# Patient Record
Sex: Male | Born: 1945 | Race: White | Hispanic: No | Marital: Single | Smoking: Former smoker
Health system: Southern US, Community
[De-identification: ages and names within clinical notes are randomized; demographics above are authoritative.]

## PROBLEM LIST (undated history)

## (undated) DIAGNOSIS — C9110 Chronic lymphocytic leukemia of B-cell type not having achieved remission: Principal | ICD-10-CM

## (undated) DIAGNOSIS — F0390 Unspecified dementia without behavioral disturbance: Secondary | ICD-10-CM

## (undated) DIAGNOSIS — R5383 Other fatigue: Secondary | ICD-10-CM

## (undated) DIAGNOSIS — R270 Ataxia, unspecified: Secondary | ICD-10-CM

## (undated) DIAGNOSIS — I10 Essential (primary) hypertension: Secondary | ICD-10-CM

## (undated) DIAGNOSIS — C801 Malignant (primary) neoplasm, unspecified: Secondary | ICD-10-CM

## (undated) DIAGNOSIS — E785 Hyperlipidemia, unspecified: Secondary | ICD-10-CM

## (undated) DIAGNOSIS — G4700 Insomnia, unspecified: Secondary | ICD-10-CM

## (undated) DIAGNOSIS — D72820 Lymphocytosis (symptomatic): Principal | ICD-10-CM

## (undated) DIAGNOSIS — N189 Chronic kidney disease, unspecified: Secondary | ICD-10-CM

## (undated) DIAGNOSIS — M109 Gout, unspecified: Secondary | ICD-10-CM

## (undated) HISTORY — DX: Gout, unspecified: M10.9

## (undated) HISTORY — DX: Ataxia, unspecified: R27.0

## (undated) HISTORY — DX: Essential (primary) hypertension: I10

## (undated) HISTORY — DX: Malignant (primary) neoplasm, unspecified: C80.1

## (undated) HISTORY — DX: Insomnia, unspecified: G47.00

## (undated) HISTORY — PX: LYMPH NODE DISSECTION: SHX5087

## (undated) HISTORY — DX: Unspecified dementia, unspecified severity, without behavioral disturbance, psychotic disturbance, mood disturbance, and anxiety: F03.90

## (undated) HISTORY — DX: Chronic kidney disease, unspecified: N18.9

## (undated) HISTORY — DX: Other fatigue: R53.83

## (undated) HISTORY — DX: Hyperlipidemia, unspecified: E78.5

## (undated) HISTORY — DX: Chronic lymphocytic leukemia of B-cell type not having achieved remission: C91.10

## (undated) HISTORY — DX: Lymphocytosis (symptomatic): D72.820

---

## 2012-06-28 ENCOUNTER — Ambulatory Visit: Payer: Self-pay | Admitting: Family Medicine

## 2012-06-28 VITALS — BP 124/88 | HR 79 | Temp 98.7°F | Resp 18 | Ht 78.0 in | Wt 327.0 lb

## 2012-06-28 DIAGNOSIS — I10 Essential (primary) hypertension: Secondary | ICD-10-CM

## 2012-06-28 DIAGNOSIS — E785 Hyperlipidemia, unspecified: Secondary | ICD-10-CM

## 2012-06-28 DIAGNOSIS — L821 Other seborrheic keratosis: Secondary | ICD-10-CM

## 2012-06-28 LAB — COMPREHENSIVE METABOLIC PANEL
ALT: 20 U/L (ref 0–53)
AST: 18 U/L (ref 0–37)
Albumin: 4.5 g/dL (ref 3.5–5.2)
Alkaline Phosphatase: 67 U/L (ref 39–117)
BUN: 30 mg/dL — ABNORMAL HIGH (ref 6–23)
CO2: 27 mEq/L (ref 19–32)
Calcium: 9.5 mg/dL (ref 8.4–10.5)
Chloride: 103 mEq/L (ref 96–112)
Creat: 1.09 mg/dL (ref 0.50–1.35)
Glucose, Bld: 111 mg/dL — ABNORMAL HIGH (ref 70–99)
Potassium: 4.8 mEq/L (ref 3.5–5.3)
Sodium: 138 mEq/L (ref 135–145)
Total Bilirubin: 0.8 mg/dL (ref 0.3–1.2)
Total Protein: 7.2 g/dL (ref 6.0–8.3)

## 2012-06-28 LAB — LIPID PANEL
Cholesterol: 164 mg/dL (ref 0–200)
HDL: 42 mg/dL (ref >39)
LDL Cholesterol: 105 mg/dL — ABNORMAL HIGH (ref 0–99)
Total CHOL/HDL Ratio: 3.9 Ratio
Triglycerides: 86 mg/dL (ref <150)
VLDL: 17 mg/dL (ref 0–40)

## 2012-06-28 MED ORDER — SIMVASTATIN 10 MG PO TABS: 10.0000 mg | ORAL_TABLET | Freq: Every day | ORAL | Status: DC

## 2012-06-28 MED ORDER — LISINOPRIL-HYDROCHLOROTHIAZIDE 10-12.5 MG PO TABS: 2.0000 | ORAL_TABLET | Freq: Every day | ORAL | Status: DC

## 2012-06-28 NOTE — Progress Notes (Signed)
66 yo on blood pressure and cholesterol medication for several years.  Needs refills.  No problems with them at present.  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  No tobacco in 30 years.  Also c/o right itchy skin lesion on back  Objective:  NAD 3 mm seborrheic keratosis. Chest:  Clear Heart:  Regular, no gallop or murmur Ext:  1+ pedal edema  Assessment:  Seborrheic keratosis, hypertension controlled, hyperlipidemia

## 2012-07-02 ENCOUNTER — Telehealth: Payer: Self-pay

## 2012-07-02 NOTE — Telephone Encounter (Signed)
Pt is needing to talk with someone about his medication lisiniopril and changing the dosage

## 2012-07-03 NOTE — Telephone Encounter (Signed)
Patient called because rx given was Lisinopril/HCTZ 10-12.5 mg 2 tabs poqd. His previous rx was Lisinopril 20-12.5 mg poqd. Wanted to know why it was different. Please clarify. If needs to be on previous dosage wants sent to pharmacy.

## 2012-07-04 NOTE — Telephone Encounter (Signed)
Pt needs to be on which ever medication dose he was on when he was last seen.  He has only filled Rx once at [facility] and it was Lisinopril/HCTZ 10/12.5 2 pills qd.  If he was taking different dose please send that to pharmacy under my name please.

## 2012-07-04 NOTE — Telephone Encounter (Signed)
Patient states he was taking higher dose. He said pharmacy advised him it was changed, I will call [facility] to verify. , called [facility], was on hold long duration, so left message for them to call me back.

## 2012-07-04 NOTE — Telephone Encounter (Signed)
[facility] does not know either. He is advised to bring in the pill bottle from [place] so I can look at it and see if it is correct, he will come in tomorrow and let me look at the bottle.

## 2012-07-05 ENCOUNTER — Other Ambulatory Visit: Payer: Self-pay | Admitting: Radiology

## 2012-07-05 MED ORDER — LISINOPRIL-HYDROCHLOROTHIAZIDE 20-12.5 MG PO TABS: 2.0000 | ORAL_TABLET | Freq: Every day | ORAL | Status: DC

## 2012-07-07 NOTE — Telephone Encounter (Signed)
Patient did bring in the old bottle, it was prescribed incorrectly, Dr L advised to give as per the old bottle this is corrected.

## 2012-12-31 ENCOUNTER — Other Ambulatory Visit: Payer: Self-pay | Admitting: Family Medicine

## 2013-02-01 ENCOUNTER — Other Ambulatory Visit: Payer: Self-pay | Admitting: Family Medicine

## 2013-03-11 ENCOUNTER — Ambulatory Visit (INDEPENDENT_AMBULATORY_CARE_PROVIDER_SITE_OTHER): Payer: Medicare Other | Admitting: Family Medicine

## 2013-03-11 VITALS — BP 136/82 | HR 84 | Temp 97.4°F | Resp 18 | Ht 77.0 in | Wt 328.6 lb

## 2013-03-11 DIAGNOSIS — M1990 Unspecified osteoarthritis, unspecified site: Secondary | ICD-10-CM

## 2013-03-11 DIAGNOSIS — I10 Essential (primary) hypertension: Secondary | ICD-10-CM

## 2013-03-11 DIAGNOSIS — E785 Hyperlipidemia, unspecified: Secondary | ICD-10-CM | POA: Insufficient documentation

## 2013-03-11 DIAGNOSIS — N289 Disorder of kidney and ureter, unspecified: Secondary | ICD-10-CM

## 2013-03-11 DIAGNOSIS — Z8739 Personal history of other diseases of the musculoskeletal system and connective tissue: Secondary | ICD-10-CM

## 2013-03-11 DIAGNOSIS — M129 Arthropathy, unspecified: Secondary | ICD-10-CM

## 2013-03-11 DIAGNOSIS — K767 Hepatorenal syndrome: Secondary | ICD-10-CM

## 2013-03-11 LAB — LIPID PANEL
LDL Cholesterol: 73 mg/dL (ref 0–99)
Triglycerides: 80 mg/dL (ref <150)

## 2013-03-11 LAB — POCT CBC
Granulocyte percent: 37.9 %G (ref 37–80)
HCT, POC: 47 % (ref 43.5–53.7)
Hemoglobin: 15.4 g/dL (ref 14.1–18.1)
POC Granulocyte: 5 (ref 2–6.9)
POC LYMPH PERCENT: 57.7 %L — AB (ref 10–50)
RDW, POC: 13.7 %

## 2013-03-11 LAB — COMPREHENSIVE METABOLIC PANEL
ALT: 18 U/L (ref 0–53)
Albumin: 4.4 g/dL (ref 3.5–5.2)
Alkaline Phosphatase: 78 U/L (ref 39–117)
Glucose, Bld: 111 mg/dL — ABNORMAL HIGH (ref 70–99)
Potassium: 4 mEq/L (ref 3.5–5.3)
Sodium: 138 mEq/L (ref 135–145)
Total Protein: 7.2 g/dL (ref 6.0–8.3)

## 2013-03-11 LAB — URIC ACID: Uric Acid, Serum: 9.7 mg/dL — ABNORMAL HIGH (ref 4.0–7.8)

## 2013-03-11 MED ORDER — SIMVASTATIN 10 MG PO TABS: 10.0000 mg | ORAL_TABLET | Freq: Every day | ORAL | Status: DC

## 2013-03-11 MED ORDER — LISINOPRIL-HYDROCHLOROTHIAZIDE 20-12.5 MG PO TABS: ORAL_TABLET | ORAL | Status: DC

## 2013-03-11 MED ORDER — INDOMETHACIN 50 MG PO CAPS: ORAL_CAPSULE | ORAL | Status: DC

## 2013-03-11 NOTE — Progress Notes (Signed)
Subjective: 67 year old man who is here for refill of his medications. He is on simvastatin for his lipids and lisinopril HCT for he is blood pressure. 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 He moved to [place] about 6 months ago. He is near his grandchildren whom he enjoys spending time with. That was the main reason he moved here. He is divorced. Other than the blood pressure and cholesterol his other main problem has been a lot of joint pains. He takes indomethacin occasionally for his ankles. He's used 90 pills in the last year. His weight has progressed upward. He does not get a lot of exercise. He lives alone and cooks for himself. He has a couple of friends in the [place] area from when he used to live here many years ago. 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 He has not been having headaches or dizziness or chest pains or palpitations. Breathing is adequate except for shortness of breath from his weight. He has some chronic problems with constipation. He has never had a colonoscopy. He has had a right radical neck from cancer of the tongue which is cured.  Objective: Overweight male in no major acute distress. He moves slowly due to his overall physical condition. His TMs are normal. Throat clear. Neck supple without nodes. He does not have much soft tissue on the right side of his neck from his radical neck surgery. His chest is clear to auscultation. Heart regular without murmurs gallops or arrhythmias. Abdomen soft without masses tenderness. Skin appears unremarkable. Ankles are normal.  Assessment: Hypertension, controlled Hyperlipidemia controlled Mild renal insufficiency with BUN 30 Osteoarthritis and possible gouty arthritis Remote history of cancer of the tongue  Plan: Refill his blood pressure and cholesterol medication. Check his labs. I will give him one refill of the indomethacin for now, but may want to try some other agents in  the future that might be less potentially harmful. Encouraged to drink plenty of fluids.

## 2013-03-11 NOTE — Patient Instructions (Addendum)
Drink plenty of fluids to keep the kidneys flushed out.  Continue the simvastatin and lisinopril HCT  Only use the indomethacin for bad flareups.  Take Tylenol (acetaminophen) extra strength 2 tablets about 3 times daily if needed for arthritic aches and pains  Return in 6 months for recheck  Try to get some regular walking and work on eating less. If you would lose weight I think you're aches and pains would be considerably better.

## 2013-12-12 ENCOUNTER — Telehealth: Payer: Self-pay | Admitting: Hematology and Oncology

## 2013-12-12 NOTE — Telephone Encounter (Signed)
S/W PATIENT AND GAVE NEW PATIENT APPT FOR 03/25 @ 2:15 W/DR. Keith.  REFERRING DR. Herbie Baltimore READE DX- PERSISTENTLY ELEVATED WBC; R/O LEUKEMIA

## 2013-12-13 ENCOUNTER — Telehealth: Payer: Self-pay | Admitting: Hematology and Oncology

## 2013-12-13 NOTE — Telephone Encounter (Signed)
C/D 12/13/13 for appt. 12/14/13

## 2013-12-14 ENCOUNTER — Ambulatory Visit (HOSPITAL_BASED_OUTPATIENT_CLINIC_OR_DEPARTMENT_OTHER): Payer: Medicare PPO

## 2013-12-14 ENCOUNTER — Encounter: Payer: Self-pay | Admitting: Hematology and Oncology

## 2013-12-14 ENCOUNTER — Ambulatory Visit (HOSPITAL_BASED_OUTPATIENT_CLINIC_OR_DEPARTMENT_OTHER): Payer: Medicare PPO | Admitting: Hematology and Oncology

## 2013-12-14 ENCOUNTER — Telehealth: Payer: Self-pay | Admitting: Hematology and Oncology

## 2013-12-14 ENCOUNTER — Other Ambulatory Visit (HOSPITAL_COMMUNITY)
Admission: RE | Admit: 2013-12-14 | Discharge: 2013-12-14 | Disposition: A | Discharge status: Home / Self Care | Payer: Medicare PPO | Source: Ambulatory Visit | Attending: Hematology and Oncology | Admitting: Hematology and Oncology

## 2013-12-14 VITALS — BP 152/95 | HR 79 | Temp 97.5°F | Resp 18 | Ht 77.0 in | Wt 349.5 lb

## 2013-12-14 DIAGNOSIS — R143 Flatulence: Secondary | ICD-10-CM

## 2013-12-14 DIAGNOSIS — R5383 Other fatigue: Secondary | ICD-10-CM

## 2013-12-14 DIAGNOSIS — R5381 Other malaise: Secondary | ICD-10-CM

## 2013-12-14 DIAGNOSIS — Z85810 Personal history of malignant neoplasm of tongue: Secondary | ICD-10-CM

## 2013-12-14 DIAGNOSIS — D696 Thrombocytopenia, unspecified: Secondary | ICD-10-CM

## 2013-12-14 DIAGNOSIS — D72829 Elevated white blood cell count, unspecified: Secondary | ICD-10-CM

## 2013-12-14 DIAGNOSIS — D72820 Lymphocytosis (symptomatic): Secondary | ICD-10-CM

## 2013-12-14 DIAGNOSIS — R142 Eructation: Secondary | ICD-10-CM

## 2013-12-14 DIAGNOSIS — R141 Gas pain: Secondary | ICD-10-CM

## 2013-12-14 HISTORY — DX: Lymphocytosis (symptomatic): D72.820

## 2013-12-14 HISTORY — DX: Other fatigue: R53.83

## 2013-12-14 LAB — CBC WITH DIFFERENTIAL/PLATELET
BASO%: 0.2 % (ref 0.0–2.0)
Basophils Absolute: 0 10*3/uL (ref 0.0–0.1)
EOS ABS: 0.2 10*3/uL (ref 0.0–0.5)
EOS%: 0.9 % (ref 0.0–7.0)
HCT: 45.5 % (ref 38.4–49.9)
HGB: 15.1 g/dL (ref 13.0–17.1)
LYMPH%: 73.4 % — AB (ref 14.0–49.0)
MCH: 29.3 pg (ref 27.2–33.4)
MCHC: 33.1 g/dL (ref 32.0–36.0)
MCV: 88.4 fL (ref 79.3–98.0)
MONO#: 0.7 10*3/uL (ref 0.1–0.9)
MONO%: 3.1 % (ref 0.0–14.0)
NEUT#: 5.1 10*3/uL (ref 1.5–6.5)
NEUT%: 22.4 % — AB (ref 39.0–75.0)
PLATELETS: 139 10*3/uL — AB (ref 140–400)
RBC: 5.14 10*6/uL (ref 4.20–5.82)
RDW: 15.5 % — ABNORMAL HIGH (ref 11.0–14.6)
WBC: 22.7 10*3/uL — ABNORMAL HIGH (ref 4.0–10.3)
lymph#: 16.7 10*3/uL — ABNORMAL HIGH (ref 0.9–3.3)

## 2013-12-14 LAB — LACTATE DEHYDROGENASE (CC13): LDH: 146 U/L (ref 125–245)

## 2013-12-14 LAB — CHCC SMEAR

## 2013-12-14 LAB — TECHNOLOGIST REVIEW

## 2013-12-14 NOTE — Progress Notes (Signed)
No financial issues. Checked in new patient and he has not been out of country.

## 2013-12-14 NOTE — Progress Notes (Signed)
[facility] CONSULT NOTE  Patient Care Team: Maury Dus, MD as Referring Physician (Family Medicine)  CHIEF COMPLAINTS/PURPOSE OF CONSULTATION:  Chronic leukocytosis  HISTORY OF PRESENTING ILLNESS:  Dale George 68 y.o. male is here because of elevated WBC.  He was found to have abnormal CBC from routine CBC. I had the opportunity to review his blood count from June 2014 to present. His white count has increased from 13.3 to as high as 23.1. Differential on the white blood cell count show predominantly lymphocytosis. He denies recent infection. The last prescription antibiotics was more than 3 months ago There is not reported symptoms of sinus congestion, cough, urinary frequency/urgency or dysuria, diarrhea, joint swelling/pain or abnormal skin rash.   The patient also endorse history of tongue cancer, underwent resection with lymph node dissection on the right side of the neck approximately 10 years ago. He underwent adjuvant radiation therapy, complicated by persistent dry mouth, altered taste sensation as well as bad dentition. The patient has lost significant amount of weight at that time. He has no recent appointment to see ENT specialist to rule out recurrence. The patient has no prior diagnosis of autoimmune disease and was not prescribed corticosteroids related products.  MEDICAL HISTORY:  Past Medical History  Diagnosis Date  . Cancer     tongue cancer  . Hypertension   . Hyperlipidemia   . Lymphocytosis 12/14/2013  . Fatigue 12/14/2013    SURGICAL HISTORY: Past Surgical History  Procedure Laterality Date  . Lymph node dissection      SOCIAL HISTORY: History   Social History  . Marital Status: Single    Spouse Name: N/A    Number of Children: N/A  . Years of Education: N/A   Occupational History  . Not on file.   Social History Main Topics  . Smoking status: Former Smoker -- 2.00 packs/day for 10 years    Quit date: 09/22/1981  . Smokeless  tobacco: Never Used  . Alcohol Use: 1.0 oz/week    2 drink(s) per week  . Drug Use: No  . Sexual Activity: Not on file   Other Topics Concern  . Not on file   Social History Narrative  . No narrative on file    FAMILY HISTORY: History reviewed. No pertinent family history.  ALLERGIES:  has No Known Allergies.  MEDICATIONS:  Current Outpatient Prescriptions  Medication Sig Dispense Refill  . allopurinol (ZYLOPRIM) 300 MG tablet Take 300 mg by mouth daily.      Marland Kitchen amLODipine (NORVASC) 5 MG tablet Take 5 mg by mouth daily.      . chlorhexidine (PERIDEX) 0.12 % solution Use as directed 15 mLs in the mouth or throat as needed.      . indomethacin (INDOCIN) 50 MG capsule Take one tablet twice daily only when needed for bad arthritis flares  90 capsule  0  . lisinopril (PRINIVIL,ZESTRIL) 20 MG tablet Take 20 mg by mouth daily.      . simvastatin (ZOCOR) 10 MG tablet Take 1 tablet (10 mg total) by mouth at bedtime.  90 tablet  3   No current facility-administered medications for this visit.    REVIEW OF SYSTEMS:   Constitutional: Denies fevers, chills or abnormal night sweats Eyes: Denies blurriness of vision, double vision or watery eyes Ears, nose, mouth, throat, and face: Denies mucositis or sore throat Respiratory: Denies cough, dyspnea or wheezes Cardiovascular: Denies palpitation, chest discomfort. He complained of new onset of bilateral lower extremity swelling Gastrointestinal:  Denies nausea, heartburn or change in bowel habits Skin: Denies abnormal skin rashes Lymphatics: Denies new lymphadenopathy or easy bruising Neurological:Denies numbness, tingling or new weaknesses Behavioral/Psych: Mood is stable, no new changes  All other systems were reviewed with the patient and are negative.  PHYSICAL EXAMINATION: ECOG PERFORMANCE STATUS: 0 - Asymptomatic  Filed Vitals:   12/14/13 1456  BP: 152/95  Pulse: 79  Temp: 97.5 F (36.4 C)  Resp: 18   Filed Weights    12/14/13 1456  Weight: 349 lb 8 oz (158.532 kg)    GENERAL:alert, no distress and comfortable. He is morbidly obese SKIN: skin color, texture, turgor are normal, no rashes or significant lesions EYES: normal, conjunctiva are pink and non-injected, sclera clear OROPHARYNX:no exudate, no erythema and lips, buccal mucosa, and tongue normal  NECK: Persistent scar on the neck consistent with previous lymph node dissection. Also noted some mild radiation-induced skin fibrosis on his neck. LYMPH:  no palpable lymphadenopathy in the cervical, axillary or inguinal LUNGS: clear to auscultation and percussion with normal breathing effort HEART: regular rate & rhythm and no murmurs and no lower extremity edema ABDOMEN:abdomen soft, non-tender and normal bowel sounds Musculoskeletal:no cyanosis of digits and no clubbing  PSYCH: alert & oriented x 3 with fluent speech NEURO: no focal motor/sensory deficits  LABORATORY DATA:  I have reviewed the data as listed Recent Results (from the past 2160 hour(s))  [facility] SMEAR     Status: None   Collection Time    12/14/13  3:33 PM      Result Value Ref Range   Smear Result Smear Available    CBC WITH DIFFERENTIAL     Status: Abnormal   Collection Time    12/14/13  3:33 PM      Result Value Ref Range   WBC 22.7 (*) 4.0 - 10.3 10e3/uL   NEUT# 5.1  1.5 - 6.5 10e3/uL   HGB 15.1  13.0 - 17.1 g/dL   HCT 45.5  38.4 - 49.9 %   Platelets 139 (*) 140 - 400 10e3/uL   MCV 88.4  79.3 - 98.0 fL   MCH 29.3  27.2 - 33.4 pg   MCHC 33.1  32.0 - 36.0 g/dL   RBC 5.14  4.20 - 5.82 10e6/uL   RDW 15.5 (*) 11.0 - 14.6 %   lymph# 16.7 (*) 0.9 - 3.3 10e3/uL   MONO# 0.7  0.1 - 0.9 10e3/uL   Eosinophils Absolute 0.2  0.0 - 0.5 10e3/uL   Basophils Absolute 0.0  0.0 - 0.1 10e3/uL   NEUT% 22.4 (*) 39.0 - 75.0 %   LYMPH% 73.4 (*) 14.0 - 49.0 %   MONO% 3.1  0.0 - 14.0 %   EOS% 0.9  0.0 - 7.0 %   BASO% 0.2  0.0 - 2.0 %  TECHNOLOGIST REVIEW     Status: None   Collection Time     12/14/13  3:33 PM      Result Value Ref Range   Technologist Review Variant lymphs and mod smudge cells present     ASSESSMENT & PLAN #1 Leukocytosis Suspect the patient may have diagnosis of CLL. I will order repeat CBC with differential, peripheral smear and flow cytometry. I will see him back next week to review test results #2 mild thrombocytopenia Again, I suspect this could be related to CLL. #3 leg edema Cause is unknown. The patient appears to have appearance of myxedema. Due to his prior radiation to the neck, I will order thyroid  function tests for evaluation to rule out hypothyroidism #4 history of tongue cancer Clinically, he does not have any signs of recurrence.

## 2013-12-14 NOTE — Telephone Encounter (Signed)
gv adn printed appt sched and avs for pt for April....sent pt to lab °

## 2013-12-15 LAB — T4, FREE: FREE T4: 1.17 ng/dL (ref 0.80–1.80)

## 2013-12-15 LAB — IGG, IGA, IGM
IgA: 145 mg/dL (ref 68–379)
IgG (Immunoglobin G), Serum: 1260 mg/dL (ref 650–1600)
IgM, Serum: 85 mg/dL (ref 41–251)

## 2013-12-15 LAB — TSH CHCC: TSH: 3.919 m[IU]/L (ref 0.320–4.118)

## 2013-12-20 LAB — FLOW CYTOMETRY

## 2013-12-23 ENCOUNTER — Telehealth: Payer: Self-pay | Admitting: *Deleted

## 2013-12-23 ENCOUNTER — Encounter: Payer: Self-pay | Admitting: Hematology and Oncology

## 2013-12-23 ENCOUNTER — Ambulatory Visit (HOSPITAL_BASED_OUTPATIENT_CLINIC_OR_DEPARTMENT_OTHER): Payer: Medicare PPO | Admitting: Hematology and Oncology

## 2013-12-23 ENCOUNTER — Telehealth: Payer: Self-pay | Admitting: Hematology and Oncology

## 2013-12-23 VITALS — BP 146/91 | HR 85 | Temp 97.6°F | Resp 20 | Ht 79.0 in | Wt 351.4 lb

## 2013-12-23 DIAGNOSIS — D696 Thrombocytopenia, unspecified: Secondary | ICD-10-CM

## 2013-12-23 DIAGNOSIS — C9110 Chronic lymphocytic leukemia of B-cell type not having achieved remission: Secondary | ICD-10-CM | POA: Insufficient documentation

## 2013-12-23 DIAGNOSIS — Z85810 Personal history of malignant neoplasm of tongue: Secondary | ICD-10-CM

## 2013-12-23 HISTORY — DX: Chronic lymphocytic leukemia of B-cell type not having achieved remission: C91.10

## 2013-12-23 NOTE — Telephone Encounter (Signed)
Pt states returning a missed call.  Informed pt of call from Scheduler to let him know of appt for Oct 2 at 1 pm and they also mailed him a calendar.  He verbalized understanding.

## 2013-12-23 NOTE — Telephone Encounter (Signed)
lvm for pt regarding to OCT appt ...mailed pt appt sched/avs and letter °

## 2013-12-23 NOTE — Progress Notes (Signed)
[facility] OFFICE PROGRESS NOTE  Patient Care Team: Maury Dus, MD as Referring Physician (Family Medicine) Heath Lark, MD as Consulting Physician (Hematology and Oncology)  DIAGNOSIS: CLL, Rai stage 0  SUMMARY OF ONCOLOGIC HISTORY: This patient was noted to have progressive leukocytosis. He was not symptomatic. The patient also had background history of tongue cancer, resected followed by adjuvant radiation therapy 10 years ago with no recurrence of disease.  INTERVAL HISTORY: Dale George 68 y.o. male returns for further followup. He complained of feeling fatigued. No recent infection.  I have reviewed the past medical history, past surgical history, social history and family history with the patient and they are unchanged from previous note.  ALLERGIES:  has No Known Allergies.  MEDICATIONS:  Current Outpatient Prescriptions  Medication Sig Dispense Refill  . allopurinol (ZYLOPRIM) 300 MG tablet Take 300 mg by mouth daily.      Marland Kitchen amLODipine (NORVASC) 5 MG tablet Take 5 mg by mouth daily.      . chlorhexidine (PERIDEX) 0.12 % solution Use as directed 15 mLs in the mouth or throat as needed.      . indomethacin (INDOCIN) 50 MG capsule Take one tablet twice daily only when needed for bad arthritis flares  90 capsule  0  . lisinopril (PRINIVIL,ZESTRIL) 20 MG tablet Take 20 mg by mouth daily.      . simvastatin (ZOCOR) 10 MG tablet Take 1 tablet (10 mg total) by mouth at bedtime.  90 tablet  3   No current facility-administered medications for this visit.    REVIEW OF SYSTEMS:   All other systems were reviewed with the patient and are negative.  PHYSICAL EXAMINATION: ECOG PERFORMANCE STATUS: 1 - Symptomatic but completely ambulatory  Filed Vitals:   12/23/13 1108  BP: 146/91  Pulse: 85  Temp: 97.6 F (36.4 C)  Resp: 20   Filed Weights   12/23/13 1108  Weight: 351 lb 6.4 oz (159.394 kg)    GENERAL:alert, no distress and comfortable. He is morbidly  obese Musculoskeletal:no cyanosis of digits and no clubbing  NEURO: alert & oriented x 3 with fluent speech, no focal motor/sensory deficits  LABORATORY DATA:  I have reviewed the data as listed    Component Value Date/Time   NA 138 03/11/2013 1505   K 4.0 03/11/2013 1505   CL 102 03/11/2013 1505   CO2 25 03/11/2013 1505   GLUCOSE 111* 03/11/2013 1505   BUN 21 03/11/2013 1505   CREATININE 1.13 03/11/2013 1505   CALCIUM 9.4 03/11/2013 1505   PROT 7.2 03/11/2013 1505   ALBUMIN 4.4 03/11/2013 1505   AST 15 03/11/2013 1505   ALT 18 03/11/2013 1505   ALKPHOS 78 03/11/2013 1505   BILITOT 1.1 03/11/2013 1505    No results found for this basename: SPEP, UPEP,  kappa and lambda light chains    Lab Results  Component Value Date   WBC 22.7* 12/14/2013   NEUTROABS 5.1 12/14/2013   HGB 15.1 12/14/2013   HCT 45.5 12/14/2013   MCV 88.4 12/14/2013   PLT 139* 12/14/2013      Chemistry      Component Value Date/Time   NA 138 03/11/2013 1505   K 4.0 03/11/2013 1505   CL 102 03/11/2013 1505   CO2 25 03/11/2013 1505   BUN 21 03/11/2013 1505   CREATININE 1.13 03/11/2013 1505      Component Value Date/Time   CALCIUM 9.4 03/11/2013 1505   ALKPHOS 78 03/11/2013 1505   AST  15 03/11/2013 1505   ALT 18 03/11/2013 1505   BILITOT 1.1 03/11/2013 1505     ASSESSMENT & PLAN:  #1 Rai stage 0 CLL #2 very mild thrombocytopenia, likely related to fatty liver disease #3 history of tongue cancer, no recurrence I discussed with the patient after her history of CLL. At present time, he does not require treatment as he is not symptomatic. I educated him the signs and symptoms to watch out for disease progression. I recommend bringing him back in 6 months with repeat blood work, history and physical examination. Patient education material was dispensed today.  Orders Placed This Encounter  Procedures  . CBC with Differential    Standing Status: Future     Number of Occurrences:      Standing Expiration Date: 12/23/2014   . Comprehensive metabolic panel    Standing Status: Future     Number of Occurrences:      Standing Expiration Date: 12/23/2014  . Lactate dehydrogenase    Standing Status: Future     Number of Occurrences:      Standing Expiration Date: 12/23/2014   All questions were answered. The patient knows to call the clinic with any problems, questions or concerns. No barriers to learning was detected. I spent 15 minutes counseling the patient face to face. The total time spent in the appointment was 20 minutes and more than 50% was on counseling and review of test results     [facility], [place], MD 12/23/2013 12:32 PM

## 2014-06-23 ENCOUNTER — Ambulatory Visit (HOSPITAL_BASED_OUTPATIENT_CLINIC_OR_DEPARTMENT_OTHER): Payer: Medicare PPO | Admitting: Hematology and Oncology

## 2014-06-23 ENCOUNTER — Encounter: Payer: Self-pay | Admitting: Hematology and Oncology

## 2014-06-23 ENCOUNTER — Telehealth: Payer: Self-pay | Admitting: Hematology and Oncology

## 2014-06-23 ENCOUNTER — Other Ambulatory Visit (HOSPITAL_BASED_OUTPATIENT_CLINIC_OR_DEPARTMENT_OTHER): Payer: Medicare PPO

## 2014-06-23 VITALS — BP 116/69 | HR 79 | Temp 98.0°F | Resp 19 | Ht 79.0 in | Wt 335.1 lb

## 2014-06-23 DIAGNOSIS — C9110 Chronic lymphocytic leukemia of B-cell type not having achieved remission: Secondary | ICD-10-CM

## 2014-06-23 DIAGNOSIS — Z85810 Personal history of malignant neoplasm of tongue: Secondary | ICD-10-CM

## 2014-06-23 LAB — COMPREHENSIVE METABOLIC PANEL (CC13)
ALT: 39 U/L (ref 0–55)
AST: 22 U/L (ref 5–34)
Albumin: 4.5 g/dL (ref 3.5–5.0)
Alkaline Phosphatase: 97 U/L (ref 40–150)
Anion Gap: 9 mEq/L (ref 3–11)
BILIRUBIN TOTAL: 1.06 mg/dL (ref 0.20–1.20)
BUN: 24.3 mg/dL (ref 7.0–26.0)
CO2: 25 mEq/L (ref 22–29)
CREATININE: 1.3 mg/dL (ref 0.7–1.3)
Calcium: 9.8 mg/dL (ref 8.4–10.4)
Chloride: 103 mEq/L (ref 98–109)
Glucose: 113 mg/dl (ref 70–140)
Potassium: 4.2 mEq/L (ref 3.5–5.1)
Sodium: 137 mEq/L (ref 136–145)
TOTAL PROTEIN: 7.7 g/dL (ref 6.4–8.3)

## 2014-06-23 LAB — CBC WITH DIFFERENTIAL/PLATELET
BASO%: 0.3 % (ref 0.0–2.0)
BASOS ABS: 0.1 10*3/uL (ref 0.0–0.1)
EOS%: 0.5 % (ref 0.0–7.0)
Eosinophils Absolute: 0.2 10*3/uL (ref 0.0–0.5)
HEMATOCRIT: 48 % (ref 38.4–49.9)
HGB: 15.6 g/dL (ref 13.0–17.1)
LYMPH%: 83.1 % — ABNORMAL HIGH (ref 14.0–49.0)
MCH: 30.1 pg (ref 27.2–33.4)
MCHC: 32.5 g/dL (ref 32.0–36.0)
MCV: 92.5 fL (ref 79.3–98.0)
MONO#: 1.1 10*3/uL — ABNORMAL HIGH (ref 0.1–0.9)
MONO%: 2.4 % (ref 0.0–14.0)
NEUT#: 6.3 10*3/uL (ref 1.5–6.5)
NEUT%: 13.7 % — AB (ref 39.0–75.0)
Platelets: 132 10*3/uL — ABNORMAL LOW (ref 140–400)
RBC: 5.19 10*6/uL (ref 4.20–5.82)
RDW: 15 % — ABNORMAL HIGH (ref 11.0–14.6)
WBC: 46 10*3/uL — ABNORMAL HIGH (ref 4.0–10.3)
lymph#: 38.2 10*3/uL — ABNORMAL HIGH (ref 0.9–3.3)

## 2014-06-23 LAB — LACTATE DEHYDROGENASE (CC13): LDH: 156 U/L (ref 125–245)

## 2014-06-23 LAB — TECHNOLOGIST REVIEW

## 2014-06-23 NOTE — Assessment & Plan Note (Signed)
Clinically, he has no signs of recurrence. I will continue observation only. 

## 2014-06-23 NOTE — Progress Notes (Signed)
[facility] OFFICE PROGRESS NOTE  Patient Care Team: Maury Dus, MD as Referring Physician (Family Medicine) Heath Lark, MD as Consulting Physician (Hematology and Oncology)  SUMMARY OF ONCOLOGIC HISTORY: This patient was noted to have progressive leukocytosis. He was not symptomatic. The patient also had background history of tongue cancer, resected followed by adjuvant radiation therapy 10 years ago with no recurrence of disease.  INTERVAL HISTORY: Please see below for problem oriented charting. He denies new symptoms. No recent infection. He denies new lymphadenopathy. REVIEW OF SYSTEMS:   Constitutional: Denies fevers, chills or abnormal weight loss Eyes: Denies blurriness of vision Ears, nose, mouth, throat, and face: Denies mucositis or sore throat Respiratory: Denies cough, dyspnea or wheezes Cardiovascular: Denies palpitation, chest discomfort or lower extremity swelling Gastrointestinal:  Denies nausea, heartburn or change in bowel habits Skin: Denies abnormal skin rashes Lymphatics: Denies new lymphadenopathy or easy bruising Neurological:Denies numbness, tingling or new weaknesses Behavioral/Psych: Mood is stable, no new changes  All other systems were reviewed with the patient and are negative.  I have reviewed the past medical history, past surgical history, social history and family history with the patient and they are unchanged from previous note.  ALLERGIES:  has No Known Allergies.  MEDICATIONS:  Current Outpatient Prescriptions  Medication Sig Dispense Refill  . allopurinol (ZYLOPRIM) 300 MG tablet Take 300 mg by mouth daily.      Marland Kitchen amLODipine (NORVASC) 5 MG tablet Take 5 mg by mouth daily.      . chlorhexidine (PERIDEX) 0.12 % solution Use as directed 15 mLs in the mouth or throat as needed.      . furosemide (LASIX) 40 MG tablet Take 40 mg by mouth.      . indomethacin (INDOCIN) 50 MG capsule Take one tablet twice daily only when needed for  bad arthritis flares  90 capsule  0  . lisinopril (PRINIVIL,ZESTRIL) 20 MG tablet Take 20 mg by mouth daily.      . simvastatin (ZOCOR) 10 MG tablet Take 1 tablet (10 mg total) by mouth at bedtime.  90 tablet  3   No current facility-administered medications for this visit.    PHYSICAL EXAMINATION: ECOG PERFORMANCE STATUS: 0 - Asymptomatic  Filed Vitals:   06/23/14 1340  BP: 116/69  Pulse: 79  Temp: 98 F (36.7 C)  Resp: 19   Filed Weights   06/23/14 1340  Weight: 335 lb 1.6 oz (152 kg)    GENERAL:alert, no distress and comfortable. He is morbidly obese SKIN: skin color, texture, turgor are normal, no rashes or significant lesions EYES: normal, Conjunctiva are pink and non-injected, sclera clear OROPHARYNX:no exudate, no erythema and lips, buccal mucosa, and tongue normal  NECK: well-healed surgical scar with associated radiation fibrosis. No palpable abnormalities LYMPH:  He has palpable bilateral lymphadenopathy in his axilla. LUNGS: clear to auscultation and percussion with normal breathing effort HEART: regular rate & rhythm and no murmurs and no lower extremity edema ABDOMEN:abdomen soft, non-tender and normal bowel sounds Musculoskeletal:no cyanosis of digits and no clubbing  NEURO: alert & oriented x 3 with fluent speech, no focal motor/sensory deficits  LABORATORY DATA:  I have reviewed the data as listed    Component Value Date/Time   NA 137 06/23/2014 1318   NA 138 03/11/2013 1505   K 4.2 06/23/2014 1318   K 4.0 03/11/2013 1505   CL 102 03/11/2013 1505   CO2 25 06/23/2014 1318   CO2 25 03/11/2013 1505   GLUCOSE 113 06/23/2014  1318   GLUCOSE 111* 03/11/2013 1505   BUN 24.3 06/23/2014 1318   BUN 21 03/11/2013 1505   CREATININE 1.3 06/23/2014 1318   CREATININE 1.13 03/11/2013 1505   CALCIUM 9.8 06/23/2014 1318   CALCIUM 9.4 03/11/2013 1505   PROT 7.7 06/23/2014 1318   PROT 7.2 03/11/2013 1505   ALBUMIN 4.5 06/23/2014 1318   ALBUMIN 4.4 03/11/2013 1505   AST 22 06/23/2014  1318   AST 15 03/11/2013 1505   ALT 39 06/23/2014 1318   ALT 18 03/11/2013 1505   ALKPHOS 97 06/23/2014 1318   ALKPHOS 78 03/11/2013 1505   BILITOT 1.06 06/23/2014 1318   BILITOT 1.1 03/11/2013 1505    No results found for this basename: SPEP,  UPEP,   kappa and lambda light chains    Lab Results  Component Value Date   WBC 46.0* 06/23/2014   NEUTROABS 6.3 06/23/2014   HGB 15.6 06/23/2014   HCT 48.0 06/23/2014   MCV 92.5 06/23/2014   PLT 132* 06/23/2014      Chemistry      Component Value Date/Time   NA 137 06/23/2014 1318   NA 138 03/11/2013 1505   K 4.2 06/23/2014 1318   K 4.0 03/11/2013 1505   CL 102 03/11/2013 1505   CO2 25 06/23/2014 1318   CO2 25 03/11/2013 1505   BUN 24.3 06/23/2014 1318   BUN 21 03/11/2013 1505   CREATININE 1.3 06/23/2014 1318   CREATININE 1.13 03/11/2013 1505      Component Value Date/Time   CALCIUM 9.8 06/23/2014 1318   CALCIUM 9.4 03/11/2013 1505   ALKPHOS 97 06/23/2014 1318   ALKPHOS 78 03/11/2013 1505   AST 22 06/23/2014 1318   AST 15 03/11/2013 1505   ALT 39 06/23/2014 1318   ALT 18 03/11/2013 1505   BILITOT 1.06 06/23/2014 1318   BILITOT 1.1 03/11/2013 1505      ASSESSMENT & PLAN:  CLL (chronic lymphocytic leukemia) His lymphocyte doubling time is almost 6 months. He is not symptomatic. He has small palpable lymph nodes in his axilla, representing disease progression I will order FISH study in his next visit. Due to her rapidly rising white blood cell count, I will see him back in 3 months for further assessment.  History of tongue cancer Clinically, he has no signs of recurrence. I will continue observation only.    Orders Placed This Encounter  Procedures  . CBC with Differential    Standing Status: Future     Number of Occurrences:      Standing Expiration Date: 07/28/2015  . Comprehensive metabolic panel    Standing Status: Future     Number of Occurrences:      Standing Expiration Date: 07/28/2015  . Lactate dehydrogenase    Standing  Status: Future     Number of Occurrences:      Standing Expiration Date: 07/28/2015  . Uric Acid    Standing Status: Future     Number of Occurrences:      Standing Expiration Date: 07/28/2015  . FISH, Peripheral Blood    CLL FISH    Standing Status: Future     Number of Occurrences:      Standing Expiration Date: 07/28/2015   All questions were answered. The patient knows to call the clinic with any problems, questions or concerns. No barriers to learning was detected. I spent 25 minutes counseling the patient face to face. The total time spent in the appointment was 30 minutes and more  than 50% was on counseling and review of test results     [facility], Christopher Glasscock, MD 06/23/2014 3:22 PM

## 2014-06-23 NOTE — Assessment & Plan Note (Signed)
His lymphocyte doubling time is almost 6 months. He is not symptomatic. He has small palpable lymph nodes in his axilla, representing disease progression I will order FISH study in his next visit. Due to her rapidly rising white blood cell count, I will see him back in 3 months for further assessment.

## 2014-06-23 NOTE — Telephone Encounter (Signed)
gv and printed appt sched and avs for pt for Jan 2016 °

## 2014-09-25 ENCOUNTER — Ambulatory Visit (HOSPITAL_BASED_OUTPATIENT_CLINIC_OR_DEPARTMENT_OTHER): Payer: Medicare PPO | Admitting: Hematology and Oncology

## 2014-09-25 ENCOUNTER — Other Ambulatory Visit (HOSPITAL_BASED_OUTPATIENT_CLINIC_OR_DEPARTMENT_OTHER): Payer: Medicare PPO

## 2014-09-25 ENCOUNTER — Encounter: Payer: Self-pay | Admitting: Hematology and Oncology

## 2014-09-25 ENCOUNTER — Telehealth: Payer: Self-pay | Admitting: Hematology and Oncology

## 2014-09-25 VITALS — BP 151/76 | HR 84 | Temp 97.7°F | Resp 19 | Ht 79.0 in | Wt 336.3 lb

## 2014-09-25 DIAGNOSIS — D696 Thrombocytopenia, unspecified: Secondary | ICD-10-CM

## 2014-09-25 DIAGNOSIS — Z85810 Personal history of malignant neoplasm of tongue: Secondary | ICD-10-CM

## 2014-09-25 DIAGNOSIS — C9110 Chronic lymphocytic leukemia of B-cell type not having achieved remission: Secondary | ICD-10-CM

## 2014-09-25 LAB — CBC WITH DIFFERENTIAL/PLATELET
BASO%: 0.3 % (ref 0.0–2.0)
Basophils Absolute: 0.2 10*3/uL — ABNORMAL HIGH (ref 0.0–0.1)
EOS%: 0.4 % (ref 0.0–7.0)
Eosinophils Absolute: 0.3 10*3/uL (ref 0.0–0.5)
HCT: 47 % (ref 38.4–49.9)
HEMOGLOBIN: 15 g/dL (ref 13.0–17.1)
LYMPH%: 85.1 % — ABNORMAL HIGH (ref 14.0–49.0)
MCH: 30 pg (ref 27.2–33.4)
MCHC: 31.9 g/dL — ABNORMAL LOW (ref 32.0–36.0)
MCV: 94.2 fL (ref 79.3–98.0)
MONO#: 1.5 10*3/uL — AB (ref 0.1–0.9)
MONO%: 2.5 % (ref 0.0–14.0)
NEUT%: 11.7 % — ABNORMAL LOW (ref 39.0–75.0)
NEUTROS ABS: 7.3 10*3/uL — AB (ref 1.5–6.5)
PLATELETS: 124 10*3/uL — AB (ref 140–400)
RBC: 4.99 10*6/uL (ref 4.20–5.82)
RDW: 14.5 % (ref 11.0–14.6)
WBC: 62.2 10*3/uL (ref 4.0–10.3)
lymph#: 52.9 10*3/uL — ABNORMAL HIGH (ref 0.9–3.3)

## 2014-09-25 LAB — COMPREHENSIVE METABOLIC PANEL (CC13)
ALT: 24 U/L (ref 0–55)
AST: 20 U/L (ref 5–34)
Albumin: 4.2 g/dL (ref 3.5–5.0)
Alkaline Phosphatase: 101 U/L (ref 40–150)
Anion Gap: 9 mEq/L (ref 3–11)
BILIRUBIN TOTAL: 0.77 mg/dL (ref 0.20–1.20)
BUN: 17.8 mg/dL (ref 7.0–26.0)
CALCIUM: 9.4 mg/dL (ref 8.4–10.4)
CO2: 29 mEq/L (ref 22–29)
CREATININE: 1.2 mg/dL (ref 0.7–1.3)
Chloride: 103 mEq/L (ref 98–109)
EGFR: 61 mL/min/{1.73_m2} — ABNORMAL LOW (ref >90)
Glucose: 90 mg/dl (ref 70–140)
Potassium: 4.3 mEq/L (ref 3.5–5.1)
Sodium: 141 mEq/L (ref 136–145)
Total Protein: 7.3 g/dL (ref 6.4–8.3)

## 2014-09-25 LAB — LACTATE DEHYDROGENASE (CC13): LDH: 152 U/L (ref 125–245)

## 2014-09-25 LAB — TECHNOLOGIST REVIEW

## 2014-09-25 LAB — URIC ACID (CC13): Uric Acid, Serum: 5.1 mg/dl (ref 2.6–7.4)

## 2014-09-25 NOTE — Telephone Encounter (Signed)
gv and printed appt sched and avs for pt for April 2016 °

## 2014-09-25 NOTE — Assessment & Plan Note (Signed)
This is likely due to disease. He is not symptomatic. Recommend observation only.

## 2014-09-25 NOTE — Assessment & Plan Note (Signed)
His lymphocyte doubling time is almost 6 months. He is not symptomatic. He has small palpable lymph nodes in his axilla, representing disease progression Due to his rapidly rising white blood cell count, I will see him back in 3 months for further assessment. At present time, due to lack of symptoms, I elected for observation only.

## 2014-09-25 NOTE — Progress Notes (Signed)
[place] OFFICE PROGRESS NOTE  Patient Care Team: Maury Dus, MD as Referring Physician (Family Medicine) Heath Lark, MD as Consulting Physician (Hematology and Oncology)  SUMMARY OF ONCOLOGIC HISTORY:  This patient was noted to have progressive leukocytosis and was found to have CLL. He was not symptomatic. The patient also had background history of tongue cancer, resected followed by adjuvant radiation therapy 10 years ago with no recurrence of disease.  INTERVAL HISTORY: Please see below for problem oriented charting. He denies recent infection. No new lymphadenopathy.  REVIEW OF SYSTEMS:   Constitutional: Denies fevers, chills or abnormal weight loss Eyes: Denies blurriness of vision Ears, nose, mouth, throat, and face: Denies mucositis or sore throat Respiratory: Denies cough, dyspnea or wheezes Cardiovascular: Denies palpitation, chest discomfort or lower extremity swelling Gastrointestinal:  Denies nausea, heartburn or change in bowel habits Skin: Denies abnormal skin rashes Lymphatics: Denies new lymphadenopathy or easy bruising Neurological:Denies numbness, tingling or new weaknesses Behavioral/Psych: Mood is stable, no new changes  All other systems were reviewed with the patient and are negative.  I have reviewed the past medical history, past surgical history, social history and family history with the patient and they are unchanged from previous note.  ALLERGIES:  has No Known Allergies.  MEDICATIONS:  Current Outpatient Prescriptions  Medication Sig Dispense Refill  . allopurinol (ZYLOPRIM) 300 MG tablet Take 300 mg by mouth daily.    Marland Kitchen amLODipine (NORVASC) 5 MG tablet Take 5 mg by mouth daily.    . chlorhexidine (PERIDEX) 0.12 % solution Use as directed 15 mLs in the mouth or throat as needed.    . furosemide (LASIX) 40 MG tablet Take 40 mg by mouth.    . indomethacin (INDOCIN) 50 MG capsule Take one tablet twice daily only when needed for  bad arthritis flares 90 capsule 0  . lisinopril (PRINIVIL,ZESTRIL) 20 MG tablet Take 20 mg by mouth daily.    . simvastatin (ZOCOR) 10 MG tablet Take 1 tablet (10 mg total) by mouth at bedtime. 90 tablet 3   No current facility-administered medications for this visit.    PHYSICAL EXAMINATION: ECOG PERFORMANCE STATUS: 0 - Asymptomatic  Filed Vitals:   09/25/14 1203  BP: 151/76  Pulse: 84  Temp: 97.7 F (36.5 C)  Resp: 19   Filed Weights   09/25/14 1203  Weight: 336 lb 4.8 oz (152.545 kg)    GENERAL:alert, no distress and comfortable. He is morbidly obese SKIN: skin color, texture, turgor are normal, no rashes or significant lesions EYES: normal, Conjunctiva are pink and non-injected, sclera clear OROPHARYNX:no exudate, no erythema and lips, buccal mucosa, and tongue normal  NECK: supple, thyroid normal size, non-tender, without nodularity LYMPH:  Palpable lymphadenopathy in the axilla, unchanged compared to prior visit LUNGS: clear to auscultation and percussion with normal breathing effort HEART: regular rate & rhythm and no murmurs and no lower extremity edema ABDOMEN:abdomen soft, non-tender and normal bowel sounds Musculoskeletal:no cyanosis of digits and no clubbing  NEURO: alert & oriented x 3 with fluent speech, no focal motor/sensory deficits  LABORATORY DATA:  I have reviewed the data as listed    Component Value Date/Time   NA 141 09/25/2014 1136   NA 138 03/11/2013 1505   K 4.3 09/25/2014 1136   K 4.0 03/11/2013 1505   CL 102 03/11/2013 1505   CO2 29 09/25/2014 1136   CO2 25 03/11/2013 1505   GLUCOSE 90 09/25/2014 1136   GLUCOSE 111* 03/11/2013 1505   BUN 17.8  09/25/2014 1136   BUN 21 03/11/2013 1505   CREATININE 1.2 09/25/2014 1136   CREATININE 1.13 03/11/2013 1505   CALCIUM 9.4 09/25/2014 1136   CALCIUM 9.4 03/11/2013 1505   PROT 7.3 09/25/2014 1136   PROT 7.2 03/11/2013 1505   ALBUMIN 4.2 09/25/2014 1136   ALBUMIN 4.4 03/11/2013 1505   AST 20  09/25/2014 1136   AST 15 03/11/2013 1505   ALT 24 09/25/2014 1136   ALT 18 03/11/2013 1505   ALKPHOS 101 09/25/2014 1136   ALKPHOS 78 03/11/2013 1505   BILITOT 0.77 09/25/2014 1136   BILITOT 1.1 03/11/2013 1505    No results found for: SPEP, UPEP  Lab Results  Component Value Date   WBC 62.2* 09/25/2014   NEUTROABS 7.3* 09/25/2014   HGB 15.0 09/25/2014   HCT 47.0 09/25/2014   MCV 94.2 09/25/2014   PLT 124* 09/25/2014      Chemistry      Component Value Date/Time   NA 141 09/25/2014 1136   NA 138 03/11/2013 1505   K 4.3 09/25/2014 1136   K 4.0 03/11/2013 1505   CL 102 03/11/2013 1505   CO2 29 09/25/2014 1136   CO2 25 03/11/2013 1505   BUN 17.8 09/25/2014 1136   BUN 21 03/11/2013 1505   CREATININE 1.2 09/25/2014 1136   CREATININE 1.13 03/11/2013 1505      Component Value Date/Time   CALCIUM 9.4 09/25/2014 1136   CALCIUM 9.4 03/11/2013 1505   ALKPHOS 101 09/25/2014 1136   ALKPHOS 78 03/11/2013 1505   AST 20 09/25/2014 1136   AST 15 03/11/2013 1505   ALT 24 09/25/2014 1136   ALT 18 03/11/2013 1505   BILITOT 0.77 09/25/2014 1136   BILITOT 1.1 03/11/2013 1505      ASSESSMENT & PLAN:  CLL (chronic lymphocytic leukemia) His lymphocyte doubling time is almost 6 months. He is not symptomatic. He has small palpable lymph nodes in his axilla, representing disease progression Due to his rapidly rising white blood cell count, I will see him back in 3 months for further assessment. At present time, due to lack of symptoms, I elected for observation only.   Thrombocytopenia This is likely due to disease. He is not symptomatic. Recommend observation only.   Orders Placed This Encounter  Procedures  . CBC with Differential    Standing Status: Future     Number of Occurrences:      Standing Expiration Date: 10/30/2015  . Comprehensive metabolic panel    Standing Status: Future     Number of Occurrences:      Standing Expiration Date: 10/30/2015  . Lactate  dehydrogenase    Standing Status: Future     Number of Occurrences:      Standing Expiration Date: 10/30/2015  . Uric Acid    Standing Status: Future     Number of Occurrences:      Standing Expiration Date: 10/30/2015   All questions were answered. The patient knows to call the clinic with any problems, questions or concerns. No barriers to learning was detected. I spent 15 minutes counseling the patient face to face. The total time spent in the appointment was 20 minutes and more than 50% was on counseling and review of test results     [facility], [place], MD 09/25/2014 12:46 PM

## 2014-10-05 LAB — FISH, PERIPHERAL BLOOD

## 2014-12-25 ENCOUNTER — Encounter: Payer: Self-pay | Admitting: Hematology and Oncology

## 2014-12-25 ENCOUNTER — Telehealth: Payer: Self-pay | Admitting: Hematology and Oncology

## 2014-12-25 ENCOUNTER — Ambulatory Visit (HOSPITAL_BASED_OUTPATIENT_CLINIC_OR_DEPARTMENT_OTHER): Payer: Medicare PPO | Admitting: Hematology and Oncology

## 2014-12-25 ENCOUNTER — Other Ambulatory Visit (HOSPITAL_BASED_OUTPATIENT_CLINIC_OR_DEPARTMENT_OTHER): Payer: Medicare PPO

## 2014-12-25 VITALS — BP 118/71 | HR 80 | Temp 97.6°F | Resp 18 | Ht 79.0 in | Wt 331.9 lb

## 2014-12-25 DIAGNOSIS — B370 Candidal stomatitis: Secondary | ICD-10-CM

## 2014-12-25 DIAGNOSIS — R5382 Chronic fatigue, unspecified: Secondary | ICD-10-CM

## 2014-12-25 DIAGNOSIS — Z85810 Personal history of malignant neoplasm of tongue: Secondary | ICD-10-CM | POA: Diagnosis not present

## 2014-12-25 DIAGNOSIS — R5383 Other fatigue: Secondary | ICD-10-CM | POA: Insufficient documentation

## 2014-12-25 DIAGNOSIS — C9110 Chronic lymphocytic leukemia of B-cell type not having achieved remission: Secondary | ICD-10-CM

## 2014-12-25 DIAGNOSIS — D696 Thrombocytopenia, unspecified: Secondary | ICD-10-CM

## 2014-12-25 LAB — COMPREHENSIVE METABOLIC PANEL (CC13)
ALT: 21 U/L (ref 0–55)
ANION GAP: 13 meq/L — AB (ref 3–11)
AST: 17 U/L (ref 5–34)
Albumin: 4.1 g/dL (ref 3.5–5.0)
Alkaline Phosphatase: 99 U/L (ref 40–150)
BUN: 30.7 mg/dL — AB (ref 7.0–26.0)
CHLORIDE: 104 meq/L (ref 98–109)
CO2: 22 meq/L (ref 22–29)
CREATININE: 1.2 mg/dL (ref 0.7–1.3)
Calcium: 9 mg/dL (ref 8.4–10.4)
EGFR: 60 mL/min/{1.73_m2} — AB (ref >90)
GLUCOSE: 119 mg/dL (ref 70–140)
Potassium: 4.2 mEq/L (ref 3.5–5.1)
Sodium: 139 mEq/L (ref 136–145)
Total Bilirubin: 0.82 mg/dL (ref 0.20–1.20)
Total Protein: 7.2 g/dL (ref 6.4–8.3)

## 2014-12-25 LAB — CBC WITH DIFFERENTIAL/PLATELET
BASO%: 0.5 % (ref 0.0–2.0)
Basophils Absolute: 0.3 10*3/uL — ABNORMAL HIGH (ref 0.0–0.1)
EOS%: 0.4 % (ref 0.0–7.0)
Eosinophils Absolute: 0.2 10*3/uL (ref 0.0–0.5)
HEMATOCRIT: 47.6 % (ref 38.4–49.9)
HEMOGLOBIN: 15.3 g/dL (ref 13.0–17.1)
LYMPH#: 44.7 10*3/uL — AB (ref 0.9–3.3)
LYMPH%: 82.6 % — ABNORMAL HIGH (ref 14.0–49.0)
MCH: 28.3 pg (ref 27.2–33.4)
MCHC: 32.1 g/dL (ref 32.0–36.0)
MCV: 88.3 fL (ref 79.3–98.0)
MONO#: 1.3 10*3/uL — ABNORMAL HIGH (ref 0.1–0.9)
MONO%: 2.4 % (ref 0.0–14.0)
NEUT#: 7.6 10*3/uL — ABNORMAL HIGH (ref 1.5–6.5)
NEUT%: 14.1 % — ABNORMAL LOW (ref 39.0–75.0)
Platelets: 122 10*3/uL — ABNORMAL LOW (ref 140–400)
RBC: 5.39 10*6/uL (ref 4.20–5.82)
RDW: 14.9 % — AB (ref 11.0–14.6)
WBC: 54.2 10*3/uL — AB (ref 4.0–10.3)

## 2014-12-25 LAB — URIC ACID (CC13): Uric Acid, Serum: 5.7 mg/dl (ref 2.6–7.4)

## 2014-12-25 LAB — LACTATE DEHYDROGENASE (CC13): LDH: 144 U/L (ref 125–245)

## 2014-12-25 LAB — TECHNOLOGIST REVIEW

## 2014-12-25 MED ORDER — FLUCONAZOLE 100 MG PO TABS: 100.0000 mg | ORAL_TABLET | Freq: Every day | ORAL | Status: DC

## 2014-12-25 NOTE — Assessment & Plan Note (Signed)
He has chronic fatigue. Due to prior treatment to his thyroid region, I will order TSH again in the next visit. His last year TSH were within normal limits.

## 2014-12-25 NOTE — Assessment & Plan Note (Signed)
His lymphocyte doubling time is almost 6 months but then it has mildly improved. He is not symptomatic. He has small palpable lymph nodes in his axilla. At present time, due to lack of symptoms, I elected for observation only. I will lengthen his visit back to every 6 months.

## 2014-12-25 NOTE — Progress Notes (Signed)
[place] OFFICE PROGRESS NOTE  Patient Care Team: Maury Dus, MD as Referring Physician (Family Medicine) Heath Lark, MD as Consulting Physician (Hematology and Oncology)  SUMMARY OF ONCOLOGIC HISTORY:  This patient was noted to have progressive leukocytosis and was found to have CLL. He was not symptomatic. The patient also had background history of tongue cancer, resected followed by adjuvant radiation therapy 10 years ago with no recurrence of disease.  INTERVAL HISTORY: Please see below for problem oriented charting. He complained of chronic fatigue. He has been complaining of pain in blistery sensation at the tip of his tongue. Denies dysphagia. Denies new lymphadenopathy. REVIEW OF SYSTEMS:   Constitutional: Denies fevers, chills or abnormal weight loss Eyes: Denies blurriness of vision Respiratory: Denies cough, dyspnea or wheezes Cardiovascular: Denies palpitation, chest discomfort or lower extremity swelling Gastrointestinal:  Denies nausea, heartburn or change in bowel habits Skin: Denies abnormal skin rashes Lymphatics: Denies new lymphadenopathy or easy bruising Neurological:Denies numbness, tingling or new weaknesses Behavioral/Psych: Mood is stable, no new changes  All other systems were reviewed with the patient and are negative.  I have reviewed the past medical history, past surgical history, social history and family history with the patient and they are unchanged from previous note.  ALLERGIES:  has No Known Allergies.  MEDICATIONS:  Current Outpatient Prescriptions  Medication Sig Dispense Refill  . allopurinol (ZYLOPRIM) 300 MG tablet Take 300 mg by mouth daily.    Marland Kitchen amLODipine (NORVASC) 5 MG tablet Take 5 mg by mouth daily.    . chlorhexidine (PERIDEX) 0.12 % solution Use as directed 15 mLs in the mouth or throat as needed.    . fluconazole (DIFLUCAN) 100 MG tablet Take 1 tablet (100 mg total) by mouth daily. 4 tablet 0  . furosemide  (LASIX) 40 MG tablet Take 40 mg by mouth.    . indomethacin (INDOCIN) 50 MG capsule Take one tablet twice daily only when needed for bad arthritis flares 90 capsule 0  . lisinopril (PRINIVIL,ZESTRIL) 20 MG tablet Take 20 mg by mouth daily.    . simvastatin (ZOCOR) 10 MG tablet Take 1 tablet (10 mg total) by mouth at bedtime. 90 tablet 3   No current facility-administered medications for this visit.    PHYSICAL EXAMINATION: ECOG PERFORMANCE STATUS: 1 - Symptomatic but completely ambulatory  Filed Vitals:   12/25/14 1008  BP: 118/71  Pulse: 80  Temp: 97.6 F (36.4 C)  Resp: 18   Filed Weights   12/25/14 1008  Weight: 331 lb 14.4 oz (150.549 kg)    GENERAL:alert, no distress and comfortable. He is morbidly obese SKIN: skin color, texture, turgor are normal, no rashes or significant lesions EYES: normal, Conjunctiva are pink and non-injected, sclera clear OROPHARYNX: No oral candidiasis. No thrush. NECK: Significant neck deformity from prior surgery and radiation. No palpable abnormalities otherwise.  LYMPH:  Palpable lymphadenopathy in the axilla. None elsewhere.  LUNGS: clear to auscultation and percussion with normal breathing effort HEART: regular rate & rhythm and no murmurs and no lower extremity edema ABDOMEN:abdomen soft, non-tender and normal bowel sounds Musculoskeletal:no cyanosis of digits and no clubbing  NEURO: alert & oriented x 3 with fluent speech, no focal motor/sensory deficits  LABORATORY DATA:  I have reviewed the data as listed    Component Value Date/Time   NA 141 09/25/2014 1136   NA 138 03/11/2013 1505   K 4.3 09/25/2014 1136   K 4.0 03/11/2013 1505   CL 102 03/11/2013 1505  CO2 29 09/25/2014 1136   CO2 25 03/11/2013 1505   GLUCOSE 90 09/25/2014 1136   GLUCOSE 111* 03/11/2013 1505   BUN 17.8 09/25/2014 1136   BUN 21 03/11/2013 1505   CREATININE 1.2 09/25/2014 1136   CREATININE 1.13 03/11/2013 1505   CALCIUM 9.4 09/25/2014 1136   CALCIUM 9.4  03/11/2013 1505   PROT 7.3 09/25/2014 1136   PROT 7.2 03/11/2013 1505   ALBUMIN 4.2 09/25/2014 1136   ALBUMIN 4.4 03/11/2013 1505   AST 20 09/25/2014 1136   AST 15 03/11/2013 1505   ALT 24 09/25/2014 1136   ALT 18 03/11/2013 1505   ALKPHOS 101 09/25/2014 1136   ALKPHOS 78 03/11/2013 1505   BILITOT 0.77 09/25/2014 1136   BILITOT 1.1 03/11/2013 1505    No results found for: SPEP, UPEP  Lab Results  Component Value Date   WBC 54.2* 12/25/2014   NEUTROABS 7.6* 12/25/2014   HGB 15.3 12/25/2014   HCT 47.6 12/25/2014   MCV 88.3 12/25/2014   PLT 122* 12/25/2014      Chemistry      Component Value Date/Time   NA 141 09/25/2014 1136   NA 138 03/11/2013 1505   K 4.3 09/25/2014 1136   K 4.0 03/11/2013 1505   CL 102 03/11/2013 1505   CO2 29 09/25/2014 1136   CO2 25 03/11/2013 1505   BUN 17.8 09/25/2014 1136   BUN 21 03/11/2013 1505   CREATININE 1.2 09/25/2014 1136   CREATININE 1.13 03/11/2013 1505      Component Value Date/Time   CALCIUM 9.4 09/25/2014 1136   CALCIUM 9.4 03/11/2013 1505   ALKPHOS 101 09/25/2014 1136   ALKPHOS 78 03/11/2013 1505   AST 20 09/25/2014 1136   AST 15 03/11/2013 1505   ALT 24 09/25/2014 1136   ALT 18 03/11/2013 1505   BILITOT 0.77 09/25/2014 1136   BILITOT 1.1 03/11/2013 1505      ASSESSMENT & PLAN:  CLL (chronic lymphocytic leukemia) His lymphocyte doubling time is almost 6 months but then it has mildly improved. He is not symptomatic. He has small palpable lymph nodes in his axilla. At present time, due to lack of symptoms, I elected for observation only. I will lengthen his visit back to every 6 months.   History of tongue cancer Clinically, he has no signs of recurrence. I will continue observation only.   Thrombocytopenia This is likely due to disease. He is not symptomatic. Recommend observation only.     Candidiasis of mouth The patient had mild candidiasis. He is not a diabetic. He could be predisposed to candidiasis  due to prior treatment to his oropharynx. I recommend 7 days of fluconazole. If it does not improve, I recommend PCP follow-up.    Other fatigue He has chronic fatigue. Due to prior treatment to his thyroid region, I will order TSH again in the next visit. His last year TSH were within normal limits.    Orders Placed This Encounter  Procedures  . CBC with Differential/Platelet    Standing Status: Future     Number of Occurrences:      Standing Expiration Date: 01/29/2016  . Comprehensive metabolic panel    Standing Status: Future     Number of Occurrences:      Standing Expiration Date: 01/29/2016  . Lactate dehydrogenase    Standing Status: Future     Number of Occurrences:      Standing Expiration Date: 01/29/2016  . TSH    Standing Status: Future  Number of Occurrences:      Standing Expiration Date: 01/29/2016   All questions were answered. The patient knows to call the clinic with any problems, questions or concerns. No barriers to learning was detected. I spent 25 minutes counseling the patient face to face. The total time spent in the appointment was 30 minutes and more than 50% was on counseling and review of test results     [facility], [place], MD 12/25/2014 10:23 AM

## 2014-12-25 NOTE — Assessment & Plan Note (Signed)
The patient had mild candidiasis. He is not a diabetic. He could be predisposed to candidiasis due to prior treatment to his oropharynx. I recommend 7 days of fluconazole. If it does not improve, I recommend PCP follow-up.

## 2014-12-25 NOTE — Assessment & Plan Note (Signed)
This is likely due to disease. He is not symptomatic. Recommend observation only.

## 2014-12-25 NOTE — Assessment & Plan Note (Signed)
Clinically, he has no signs of recurrence. I will continue observation only.

## 2014-12-25 NOTE — Telephone Encounter (Signed)
Gave avs & calendar for October. °

## 2015-03-29 ENCOUNTER — Inpatient Hospital Stay (HOSPITAL_COMMUNITY)
Admission: EM | Admit: 2015-03-29 | Discharge: 2015-03-30 | DRG: 683 | Disposition: A | Discharge status: Home Health | Payer: Medicare PPO | Attending: Internal Medicine | Admitting: Internal Medicine

## 2015-03-29 ENCOUNTER — Inpatient Hospital Stay (HOSPITAL_COMMUNITY): Payer: Medicare PPO

## 2015-03-29 ENCOUNTER — Encounter (HOSPITAL_COMMUNITY): Payer: Self-pay | Admitting: Emergency Medicine

## 2015-03-29 ENCOUNTER — Emergency Department (HOSPITAL_COMMUNITY): Payer: Medicare PPO

## 2015-03-29 DIAGNOSIS — N189 Chronic kidney disease, unspecified: Secondary | ICD-10-CM | POA: Diagnosis present

## 2015-03-29 DIAGNOSIS — I951 Orthostatic hypotension: Secondary | ICD-10-CM | POA: Diagnosis present

## 2015-03-29 DIAGNOSIS — S0101XA Laceration without foreign body of scalp, initial encounter: Secondary | ICD-10-CM | POA: Diagnosis present

## 2015-03-29 DIAGNOSIS — E875 Hyperkalemia: Secondary | ICD-10-CM | POA: Diagnosis present

## 2015-03-29 DIAGNOSIS — Z87891 Personal history of nicotine dependence: Secondary | ICD-10-CM

## 2015-03-29 DIAGNOSIS — D696 Thrombocytopenia, unspecified: Secondary | ICD-10-CM | POA: Diagnosis present

## 2015-03-29 DIAGNOSIS — C9110 Chronic lymphocytic leukemia of B-cell type not having achieved remission: Secondary | ICD-10-CM | POA: Diagnosis present

## 2015-03-29 DIAGNOSIS — S0191XD Laceration without foreign body of unspecified part of head, subsequent encounter: Secondary | ICD-10-CM | POA: Diagnosis not present

## 2015-03-29 DIAGNOSIS — I10 Essential (primary) hypertension: Secondary | ICD-10-CM | POA: Diagnosis present

## 2015-03-29 DIAGNOSIS — Z6837 Body mass index (BMI) 37.0-37.9, adult: Secondary | ICD-10-CM

## 2015-03-29 DIAGNOSIS — R5383 Other fatigue: Secondary | ICD-10-CM | POA: Diagnosis not present

## 2015-03-29 DIAGNOSIS — E440 Moderate protein-calorie malnutrition: Secondary | ICD-10-CM | POA: Diagnosis present

## 2015-03-29 DIAGNOSIS — G4700 Insomnia, unspecified: Secondary | ICD-10-CM | POA: Diagnosis present

## 2015-03-29 DIAGNOSIS — R627 Adult failure to thrive: Secondary | ICD-10-CM | POA: Diagnosis present

## 2015-03-29 DIAGNOSIS — R0609 Other forms of dyspnea: Secondary | ICD-10-CM

## 2015-03-29 DIAGNOSIS — Z79899 Other long term (current) drug therapy: Secondary | ICD-10-CM | POA: Diagnosis not present

## 2015-03-29 DIAGNOSIS — E785 Hyperlipidemia, unspecified: Secondary | ICD-10-CM | POA: Diagnosis present

## 2015-03-29 DIAGNOSIS — T465X5A Adverse effect of other antihypertensive drugs, initial encounter: Secondary | ICD-10-CM | POA: Diagnosis present

## 2015-03-29 DIAGNOSIS — W19XXXA Unspecified fall, initial encounter: Secondary | ICD-10-CM | POA: Diagnosis present

## 2015-03-29 DIAGNOSIS — Z923 Personal history of irradiation: Secondary | ICD-10-CM

## 2015-03-29 DIAGNOSIS — T1490XA Injury, unspecified, initial encounter: Secondary | ICD-10-CM

## 2015-03-29 DIAGNOSIS — T501X5A Adverse effect of loop [high-ceiling] diuretics, initial encounter: Secondary | ICD-10-CM | POA: Diagnosis present

## 2015-03-29 DIAGNOSIS — Y92009 Unspecified place in unspecified non-institutional (private) residence as the place of occurrence of the external cause: Secondary | ICD-10-CM

## 2015-03-29 DIAGNOSIS — Z85810 Personal history of malignant neoplasm of tongue: Secondary | ICD-10-CM | POA: Diagnosis present

## 2015-03-29 DIAGNOSIS — E860 Dehydration: Secondary | ICD-10-CM | POA: Diagnosis present

## 2015-03-29 DIAGNOSIS — I129 Hypertensive chronic kidney disease with stage 1 through stage 4 chronic kidney disease, or unspecified chronic kidney disease: Secondary | ICD-10-CM | POA: Diagnosis present

## 2015-03-29 DIAGNOSIS — N179 Acute kidney failure, unspecified: Secondary | ICD-10-CM | POA: Diagnosis present

## 2015-03-29 LAB — CBC WITH DIFFERENTIAL/PLATELET
Basophils Absolute: 0 10*3/uL (ref 0.0–0.1)
Basophils Relative: 0 % (ref 0–1)
Eosinophils Absolute: 0 10*3/uL (ref 0.0–0.7)
Eosinophils Relative: 0 % (ref 0–5)
HCT: 42.7 % (ref 39.0–52.0)
Hemoglobin: 14.2 g/dL (ref 13.0–17.0)
LYMPHS PCT: 85 % — AB (ref 12–46)
Lymphs Abs: 63.3 10*3/uL — ABNORMAL HIGH (ref 0.7–4.0)
MCH: 29.7 pg (ref 26.0–34.0)
MCHC: 33.3 g/dL (ref 30.0–36.0)
MCV: 89.3 fL (ref 78.0–100.0)
MONOS PCT: 2 % — AB (ref 3–12)
Monocytes Absolute: 1.5 10*3/uL — ABNORMAL HIGH (ref 0.1–1.0)
Neutro Abs: 9.7 10*3/uL — ABNORMAL HIGH (ref 1.7–7.7)
Neutrophils Relative %: 13 % — ABNORMAL LOW (ref 43–77)
PLATELETS: 113 10*3/uL — AB (ref 150–400)
RBC: 4.78 MIL/uL (ref 4.22–5.81)
RDW: 15.7 % — ABNORMAL HIGH (ref 11.5–15.5)
WBC: 74.5 10*3/uL — AB (ref 4.0–10.5)

## 2015-03-29 LAB — BASIC METABOLIC PANEL
ANION GAP: 10 (ref 5–15)
BUN: 47 mg/dL — ABNORMAL HIGH (ref 6–20)
CALCIUM: 9 mg/dL (ref 8.9–10.3)
CHLORIDE: 102 mmol/L (ref 101–111)
CO2: 23 mmol/L (ref 22–32)
Creatinine, Ser: 1.5 mg/dL — ABNORMAL HIGH (ref 0.61–1.24)
GFR calc Af Amer: 53 mL/min — ABNORMAL LOW (ref >60)
GFR calc non Af Amer: 46 mL/min — ABNORMAL LOW (ref >60)
Glucose, Bld: 119 mg/dL — ABNORMAL HIGH (ref 65–99)
Potassium: 5.9 mmol/L — ABNORMAL HIGH (ref 3.5–5.1)
SODIUM: 135 mmol/L (ref 135–145)

## 2015-03-29 LAB — COMPREHENSIVE METABOLIC PANEL
ALBUMIN: 4.6 g/dL (ref 3.5–5.0)
ALK PHOS: 96 U/L (ref 38–126)
ALT: 25 U/L (ref 17–63)
AST: 33 U/L (ref 15–41)
Anion gap: 7 (ref 5–15)
BUN: 48 mg/dL — ABNORMAL HIGH (ref 6–20)
CALCIUM: 9.1 mg/dL (ref 8.9–10.3)
CO2: 26 mmol/L (ref 22–32)
Chloride: 101 mmol/L (ref 101–111)
Creatinine, Ser: 1.67 mg/dL — ABNORMAL HIGH (ref 0.61–1.24)
GFR calc Af Amer: 47 mL/min — ABNORMAL LOW (ref >60)
GFR calc non Af Amer: 40 mL/min — ABNORMAL LOW (ref >60)
Glucose, Bld: 132 mg/dL — ABNORMAL HIGH (ref 65–99)
POTASSIUM: 6.1 mmol/L — AB (ref 3.5–5.1)
SODIUM: 134 mmol/L — AB (ref 135–145)
Total Bilirubin: 0.9 mg/dL (ref 0.3–1.2)
Total Protein: 7.8 g/dL (ref 6.5–8.1)

## 2015-03-29 LAB — I-STAT CHEM 8, ED
BUN: 61 mg/dL — AB (ref 6–20)
CALCIUM ION: 1.13 mmol/L (ref 1.13–1.30)
CHLORIDE: 100 mmol/L — AB (ref 101–111)
Creatinine, Ser: 1.6 mg/dL — ABNORMAL HIGH (ref 0.61–1.24)
Glucose, Bld: 129 mg/dL — ABNORMAL HIGH (ref 65–99)
HEMATOCRIT: 47 % (ref 39.0–52.0)
Hemoglobin: 16 g/dL (ref 13.0–17.0)
POTASSIUM: 5.5 mmol/L — AB (ref 3.5–5.1)
Sodium: 134 mmol/L — ABNORMAL LOW (ref 135–145)
TCO2: 24 mmol/L (ref 0–100)

## 2015-03-29 LAB — URIC ACID
URIC ACID, SERUM: 5.2 mg/dL (ref 4.4–7.6)
URIC ACID, SERUM: 4.9 mg/dL (ref 4.4–7.6)

## 2015-03-29 LAB — PHOSPHORUS
PHOSPHORUS: 3.2 mg/dL (ref 2.5–4.6)
Phosphorus: 3.5 mg/dL (ref 2.5–4.6)

## 2015-03-29 LAB — TSH: TSH: 2.419 u[IU]/mL (ref 0.350–4.500)

## 2015-03-29 MED ORDER — SODIUM CHLORIDE 0.9 % IV BOLUS (SEPSIS)
1000.0000 mL | Freq: Once | INTRAVENOUS | Status: AC
  Administered 2015-03-29: 1000 mL via INTRAVENOUS

## 2015-03-29 MED ORDER — AMLODIPINE BESYLATE 5 MG PO TABS
5.0000 mg | ORAL_TABLET | Freq: Every day | ORAL | Status: DC
  Administered 2015-03-30: 5 mg via ORAL
  Filled 2015-03-29: qty 1

## 2015-03-29 MED ORDER — ACETAMINOPHEN 325 MG PO TABS
650.0000 mg | ORAL_TABLET | Freq: Four times a day (QID) | ORAL | Status: DC | PRN
  Administered 2015-03-30: 650 mg via ORAL
  Filled 2015-03-29: qty 2

## 2015-03-29 MED ORDER — ONDANSETRON HCL 4 MG/2ML IJ SOLN: 4.0000 mg | Freq: Four times a day (QID) | INTRAMUSCULAR | Status: DC | PRN

## 2015-03-29 MED ORDER — ACETAMINOPHEN 650 MG RE SUPP: 650.0000 mg | Freq: Four times a day (QID) | RECTAL | Status: DC | PRN

## 2015-03-29 MED ORDER — CHLORHEXIDINE GLUCONATE 0.12 % MT SOLN
15.0000 mL | Freq: Two times a day (BID) | OROMUCOSAL | Status: DC
Start: 2015-03-29 — End: 2015-03-30
  Administered 2015-03-30: 15 mL via OROMUCOSAL
  Filled 2015-03-29 (×3): qty 15

## 2015-03-29 MED ORDER — CETYLPYRIDINIUM CHLORIDE 0.05 % MT LIQD
7.0000 mL | Freq: Two times a day (BID) | OROMUCOSAL | Status: DC
  Administered 2015-03-30 (×2): 7 mL via OROMUCOSAL

## 2015-03-29 MED ORDER — SODIUM CHLORIDE 0.9 % IJ SOLN: 3.0000 mL | Freq: Two times a day (BID) | INTRAMUSCULAR | Status: DC

## 2015-03-29 MED ORDER — HEPARIN SODIUM (PORCINE) 5000 UNIT/ML IJ SOLN
5000.0000 [IU] | Freq: Three times a day (TID) | INTRAMUSCULAR | Status: DC
  Administered 2015-03-29 – 2015-03-30 (×2): 5000 [IU] via SUBCUTANEOUS
  Filled 2015-03-29 (×3): qty 1

## 2015-03-29 MED ORDER — ZOLPIDEM TARTRATE 5 MG PO TABS
5.0000 mg | ORAL_TABLET | Freq: Every evening | ORAL | Status: DC | PRN
  Administered 2015-03-29: 5 mg via ORAL
  Filled 2015-03-29: qty 1

## 2015-03-29 MED ORDER — ALLOPURINOL 300 MG PO TABS
300.0000 mg | ORAL_TABLET | Freq: Every day | ORAL | Status: DC
  Administered 2015-03-30: 300 mg via ORAL
  Filled 2015-03-29: qty 1

## 2015-03-29 MED ORDER — SODIUM POLYSTYRENE SULFONATE 15 GM/60ML PO SUSP
15.0000 g | Freq: Once | ORAL | Status: AC
  Administered 2015-03-29: 15 g via ORAL
  Filled 2015-03-29: qty 60

## 2015-03-29 MED ORDER — ONDANSETRON HCL 4 MG PO TABS: 4.0000 mg | ORAL_TABLET | Freq: Four times a day (QID) | ORAL | Status: DC | PRN

## 2015-03-29 MED ORDER — SODIUM CHLORIDE 0.9 % IV SOLN
INTRAVENOUS | Status: DC
  Administered 2015-03-29: 23:00:00 via INTRAVENOUS

## 2015-03-29 MED ORDER — SODIUM CHLORIDE 0.9 % IV SOLN
INTRAVENOUS | Status: DC
  Administered 2015-03-29: 21:00:00 via INTRAVENOUS

## 2015-03-29 MED ORDER — SODIUM CHLORIDE 0.9 % IV SOLN
1.0000 g | Freq: Once | INTRAVENOUS | Status: AC
  Administered 2015-03-29: 1 g via INTRAVENOUS
  Filled 2015-03-29: qty 10

## 2015-03-29 MED ORDER — SIMVASTATIN 10 MG PO TABS
10.0000 mg | ORAL_TABLET | Freq: Every day | ORAL | Status: DC
  Administered 2015-03-29: 10 mg via ORAL
  Filled 2015-03-29 (×2): qty 1

## 2015-03-29 NOTE — H&P (Addendum)
[facility] History and Physical  Patient: Dale George  MRN: [id number]  DOB: 1946/08/07  DOS: the patient was seen and examined on 03/29/2015 PCP: No primary care provider on file.  Referring physician: Dr. Zenia Resides Chief Complaint: Fall  HPI: Dale George is a 69 y.o. male with Past medical history of CLL, tongue cancer, hypertension, thrombocytopenia. The patient is presenting with complaints of a fall. Patient's protein car parking spot was taken and therefore he had to park his car farther away from his house. While walking back he had to walk across 8 car's and while walking he started becoming dizzy and lightheaded and felt significantly weak and tired also had some shortness of breath and then lost his balance and fell on the ground he had some head injury and also has some neck stiffness. At the time of my evaluation he denies any headache denies any chest pain denies any abdominal pain. He denies any diarrhea or constipation. He denies any shortness of breath. His primary complaint is that he has not been able to sleep since last 6 months adequately and has been always feeling tired and weak and lethargic during the day. He is not aware of any snoring or any apnea. He has not had any sleep studies in the past. He denies any recent change in his medications. He has a history of CLL and is currently on observation. He does not remember any of his medication but he says that he takes 2 red pills in the morning and 1 pill twice a day. Since last 2 days he has not been eating appropriately.  The patient is coming from home.  At his baseline ambulates without any support And is independent for most of his ADL manages her medication on his own.  Review of Systems: as mentioned in the history of present illness.  A comprehensive review of the other systems is negative.  Past Medical History  Diagnosis Date  . Cancer     tongue cancer  . Hypertension   . Hyperlipidemia     . Lymphocytosis 12/14/2013  . Fatigue 12/14/2013  . CLL (chronic lymphocytic leukemia) 12/23/2013   Past Surgical History  Procedure Laterality Date  . Lymph node dissection     Social History:  reports that he quit smoking about 33 years ago. He has never used smokeless tobacco. He reports that he drinks about 1.0 oz of alcohol per week. He reports that he does not use illicit drugs.  No Known Allergies  History reviewed. No pertinent family history.  Prior to Admission medications   Medication Sig Start Date End Date Taking? Authorizing Provider  allopurinol (ZYLOPRIM) 300 MG tablet Take 300 mg by mouth daily. 11/28/13  Yes Historical Provider, MD  amLODipine (NORVASC) 5 MG tablet Take 5 mg by mouth daily. 11/28/13  Yes Historical Provider, MD  lisinopril (PRINIVIL,ZESTRIL) 20 MG tablet Take 20 mg by mouth daily. 11/28/13  Yes Historical Provider, MD  potassium chloride SA (K-DUR,KLOR-CON) 20 MEQ tablet TK 1 T PO QAM 02/11/15  Yes Historical Provider, MD  simvastatin (ZOCOR) 10 MG tablet Take 1 tablet (10 mg total) by mouth at bedtime. 03/11/13  Yes Posey Boyer, MD  chlorhexidine (PERIDEX) 0.12 % solution Use as directed 15 mLs in the mouth or throat as needed.    Historical Provider, MD  fluconazole (DIFLUCAN) 100 MG tablet Take 1 tablet (100 mg total) by mouth daily. Patient not taking: Reported on 03/29/2015 12/25/14   Heath Lark, MD  furosemide (LASIX) 40 MG tablet Take 40 mg by mouth daily.     Historical Provider, MD  indomethacin (INDOCIN) 50 MG capsule Take one tablet twice daily only when needed for bad arthritis flares 03/11/13   Posey Boyer, MD    Physical Exam: Filed Vitals:   03/29/15 2000 03/29/15 2034 03/29/15 2200 03/29/15 2332  BP: 116/77 126/60 115/66 110/60  Pulse: 100 105 91 102  Temp:    98.2 F (36.8 C)  TempSrc:    Oral  Resp:  24 23 20   SpO2: 95% 97% 95% 100%    General: Alert, Awake and Oriented to Time, Place and Person. Appear in mild distress Eyes:  PERRL ENT: Oral Mucosa clear moist. Neck: no JVD Cardiovascular: S1 and S2 Present, no Murmur, Peripheral Pulses Present Respiratory: Bilateral Air entry equal and Decreased,  Clear to Auscultation, no Crackles, no wheezes Abdomen: Bowel Sound present, Soft and non tender Skin: no Rash Extremities: trace Pedal edema, no calf tenderness Neurologic: Grossly no focal neuro deficit.  Labs on Admission:  CBC:  Recent Labs Lab 03/29/15 1800 03/29/15 1852  WBC  --  74.5*  NEUTROABS  --  9.7*  HGB 16.0 14.2  HCT 47.0 42.7  MCV  --  89.3  PLT  --  113*    CMP     Component Value Date/Time   NA 135 03/29/2015 2117   NA 139 12/25/2014 0951   K 5.9* 03/29/2015 2117   K 4.2 12/25/2014 0951   CL 102 03/29/2015 2117   CO2 23 03/29/2015 2117   CO2 22 12/25/2014 0951   GLUCOSE 119* 03/29/2015 2117   GLUCOSE 119 12/25/2014 0951   BUN 47* 03/29/2015 2117   BUN 30.7* 12/25/2014 0951   CREATININE 1.50* 03/29/2015 2117   CREATININE 1.2 12/25/2014 0951   CREATININE 1.13 03/11/2013 1505   CALCIUM 9.0 03/29/2015 2117   CALCIUM 9.0 12/25/2014 0951   PROT 7.8 03/29/2015 1852   PROT 7.2 12/25/2014 0951   ALBUMIN 4.6 03/29/2015 1852   ALBUMIN 4.1 12/25/2014 0951   AST 33 03/29/2015 1852   AST 17 12/25/2014 0951   ALT 25 03/29/2015 1852   ALT 21 12/25/2014 0951   ALKPHOS 96 03/29/2015 1852   ALKPHOS 99 12/25/2014 0951   BILITOT 0.9 03/29/2015 1852   BILITOT 0.82 12/25/2014 0951   GFRNONAA 46* 03/29/2015 2117   GFRAA 53* 03/29/2015 2117    No results for input(s): LIPASE, AMYLASE in the last 168 hours.  No results for input(s): CKTOTAL, CKMB, CKMBINDEX, TROPONINI in the last 168 hours. BNP (last 3 results) No results for input(s): BNP in the last 8760 hours.  ProBNP (last 3 results) No results for input(s): PROBNP in the last 8760 hours.   Radiological Exams on Admission: X-ray Chest Pa And Lateral  03/29/2015   CLINICAL DATA:  Fall, fatigue, dyspnea on exertion.  EXAM: CHEST   2 VIEW  COMPARISON:  None.  FINDINGS: No cardiomegaly. Negative aortic and hilar contours. There is no edema, consolidation, effusion, or pneumothorax. Thoracic dextroscoliosis. No acute superimposed findings.  IMPRESSION: No active cardiopulmonary disease.   Electronically Signed   By: Monte Fantasia M.D.   On: 03/29/2015 22:39   Ct Head Wo Contrast  03/29/2015   CLINICAL DATA:  69 year old who fell while walking outside earlier today, striking the back of the head. Patient denies loss of consciousness. Initial encounter.  EXAM: CT HEAD WITHOUT CONTRAST  CT CERVICAL SPINE WITHOUT CONTRAST  TECHNIQUE: Multidetector CT imaging  of the head and cervical spine was performed following the standard protocol without intravenous contrast. Multiplanar CT image reconstructions of the cervical spine were also generated.  COMPARISON:  None.  FINDINGS: CT HEAD FINDINGS  Moderate cortical, deep and cerebellar atrophy. Moderate changes of small vessel disease of the cerebral white matter. Panventricular enlargement, felt to be due to the deep atrophy. No mass lesion. No midline shift. No acute hemorrhage or hematoma. No extra-axial fluid collections. No evidence of acute infarction.  No skull fracture or other focal osseous abnormality involving the skull. Visualized paranasal sinuses, bilateral mastoid air cells and bilateral middle ear cavities well-aerated. Minimal right carotid siphon atherosclerosis and moderate to severe vertebrobasilar atherosclerosis.  CT CERVICAL SPINE FINDINGS  No fractures identified involving the cervical spine. 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 Sagittal reconstructed images demonstrate anatomic posterior alignment. Disc space narrowing at every cervical level from C3-4  through C7-T1, worst at C5-6. Desiccated disc protrusion/extrusion at C3-4, the desiccated fragment located in the left lateral recess. Mild multifactorial spinal stenosis at C3-4. Coronal images demonstrate an intact craniocervical junction, intact C1-C2 articulation with degenerative changes, intact dens, and intact lateral masses. Facet joints intact throughout with severe degenerative changes at C4-5, C5-6 and C6-7. Uncinate and facet hypertrophy account for multilevel foraminal stenoses including severe left and moderate right C3-4, severe bilateral C4-5, severe right and moderate left C5-6, moderate left C6-7.  IMPRESSION: 1. No acute intracranial abnormality. 2. Moderate generalized brain atrophy and moderate chronic microvascular ischemic changes of the white matter diffusely. 3. No cervical spine fractures identified. 4. 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 5. Multilevel degenerative disc disease, spondylosis and facet degenerative changes resulting in mild multifactorial spinal stenosis at C3-4 and multilevel foraminal stenoses as detailed above.   Electronically Signed   By: Evangeline Dakin M.D.   On: 03/29/2015 19:10   Ct Cervical Spine Wo Contrast  03/29/2015   CLINICAL DATA:  69 year old who fell while walking outside earlier today, striking the back of the head. Patient denies loss of consciousness. Initial encounter.  EXAM: CT HEAD WITHOUT CONTRAST  CT CERVICAL SPINE WITHOUT CONTRAST  TECHNIQUE: Multidetector CT imaging of the head and cervical spine was performed following the standard protocol without intravenous contrast. Multiplanar CT image reconstructions of the cervical spine were also generated.  COMPARISON:  None.  FINDINGS: CT HEAD FINDINGS  Moderate cortical, deep and cerebellar atrophy. Moderate changes of small vessel disease of the cerebral white matter. Panventricular enlargement, felt to be due to the deep atrophy. No mass lesion. No midline shift. No acute  hemorrhage or hematoma. No extra-axial fluid collections. No evidence of acute infarction.  No skull fracture or other focal osseous abnormality involving the skull. Visualized paranasal sinuses, bilateral mastoid air cells and bilateral middle ear cavities well-aerated. Minimal right carotid siphon atherosclerosis and moderate to severe vertebrobasilar atherosclerosis.  CT CERVICAL SPINE FINDINGS  No fractures identified involving the cervical spine. 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 Sagittal reconstructed images demonstrate anatomic posterior alignment. Disc space narrowing at every cervical level from C3-4 through C7-T1, worst at C5-6. Desiccated disc protrusion/extrusion  at C3-4, the desiccated fragment located in the left lateral recess. Mild multifactorial spinal stenosis at C3-4. Coronal images demonstrate an intact craniocervical junction, intact C1-C2 articulation with degenerative changes, intact dens, and intact lateral masses. Facet joints intact throughout with severe degenerative changes at C4-5, C5-6 and C6-7. Uncinate and facet hypertrophy account for multilevel foraminal stenoses including severe left and moderate right C3-4, severe bilateral C4-5, severe right and moderate left C5-6, moderate left C6-7.  IMPRESSION: 1. No acute intracranial abnormality. 2. Moderate generalized brain atrophy and moderate chronic microvascular ischemic changes of the white matter diffusely. 3. No cervical spine fractures identified. 4. 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 5. Multilevel degenerative disc disease, spondylosis and facet degenerative changes resulting in mild multifactorial spinal stenosis at C3-4 and multilevel foraminal stenoses as detailed above.    Electronically Signed   By: Evangeline Dakin M.D.   On: 03/29/2015 19:10   EKG: Independently reviewed. sinus tachycardia.  Assessment/Plan Principal Problem:   Acute kidney injury Active Problems:   HTN (hypertension)   Fatigue   History of tongue cancer   CLL (chronic lymphocytic leukemia)   Thrombocytopenia   Hyperkalemia   1. Acute kidney injury The patient is presenting with complaints off a fall. His workup shows that he has developed increasing his serum creatinine with acute on chronic kidney disease. He also has developed significant hyperkalemia. He is orthostatic. With this most likely the presentation is associated with dehydration secondary to ongoing use of antihypertensives medication as well as diuretic. Normal uric acid level as well as phosphorus level rules out tumor lysis syndrome. With this I would continue hydrating with IV fluids. Patient has received Kayexalate. We will recheck his potassium level. Monitor on telemetry. Holding all his medications.  2.CLL. His lymphocyte count is stable platelets are stable he has worsening WBC count. Normal uric acid and phosphorus level rules out tumor lysis syndrome. Continue allopurinol and IV fluids.  3.Chronic fatigue. We will check TSH with folic acid and vitamin B 12 levels. Most likely associated with his CLL. Although possibility of sleep apnea or obesity hypoventilation syndrome cannot be ruled out. Patient will require outpatient workup for the same.  4.Hyperkalemia. Most likely secondary to acute kidney injury as well as ongoing use of potassium supplements. Currently he is alert and Kayexalate. Monitor on telemetry. Recheck potassium.  5. Fall. Most likely she with orthostatic hypertension. Patient's family would verify all his medications. CT scan of the head and cervical spine is negative for any acute abnormality. PTOT consultation in morning.  Advance goals of care discussion:Full code    DVT Prophylaxis: subcutaneous Heparin Nutrition: Regular diet  Family Communication: family was present at bedside, opportunity was given to ask question and all questions were answered satisfactorily at the time of interview. Disposition: Admitted as inpatient, Telemetry unit.  Author: Berle Mull, MD [facility] Pager: [phone] 03/29/2015  If 7PM-7AM, please contact night-coverage [email or web address] Password [email or web address]

## 2015-03-29 NOTE — ED Notes (Signed)
Per dr. Posey Pronto, please recheck the pt labs, if K is less than 6, the pt can go to tele.

## 2015-03-29 NOTE — ED Notes (Signed)
Bed: GE72 Expected date:  Expected time:  Means of arrival:  Comments: Fall 69 yo M, head laceration

## 2015-03-29 NOTE — ED Provider Notes (Signed)
CSN: [id number]     Arrival date & time 03/29/15  1659 History   First MD Initiated Contact with Patient 03/29/15 1741     Chief Complaint  Patient presents with  . Fall  . Head Laceration     (Consider location/radiation/quality/duration/timing/severity/associated sxs/prior Treatment) HPI Comments: Patient here after having a a fall while he was in. States that he had to walk for longer distance than normal. Denies any associated chest pressure chest pain. No palpitations. No recent illnesses. Weakness was diffuse and he fell backwards. Struck his occiput of his head. No loss of consciousness. No vomiting or nausea. According to daughter, he is at his baseline. Bleeding at his scalp was controlled with pressure. Does note some lateral neck pain without weakness in his arms or legs.  Patient is a 69 y.o. male presenting with fall and scalp laceration. The history is provided by the patient and a relative.  Fall  Head Laceration    Past Medical History  Diagnosis Date  . Cancer     tongue cancer  . Hypertension   . Hyperlipidemia   . Lymphocytosis 12/14/2013  . Fatigue 12/14/2013  . CLL (chronic lymphocytic leukemia) 12/23/2013   Past Surgical History  Procedure Laterality Date  . Lymph node dissection     No family history on file. History  Substance Use Topics  . Smoking status: Former Smoker -- 2.00 packs/day for 10 years    Quit date: 09/22/1981  . Smokeless tobacco: Never Used  . Alcohol Use: 1.0 oz/week    2 drink(s) per week    Review of Systems  All other systems reviewed and are negative.     Allergies  Review of patient's allergies indicates no known allergies.  Home Medications   Prior to Admission medications   Medication Sig Start Date End Date Taking? Authorizing Provider  allopurinol (ZYLOPRIM) 300 MG tablet Take 300 mg by mouth daily. 11/28/13  Yes Historical Provider, MD  amLODipine (NORVASC) 5 MG tablet Take 5 mg by mouth daily. 11/28/13  Yes  Historical Provider, MD  furosemide (LASIX) 40 MG tablet Take 40 mg by mouth daily.    Yes Historical Provider, MD  lisinopril (PRINIVIL,ZESTRIL) 20 MG tablet Take 20 mg by mouth daily. 11/28/13  Yes Historical Provider, MD  potassium chloride SA (K-DUR,KLOR-CON) 20 MEQ tablet TK 1 T PO QAM 02/11/15  Yes Historical Provider, MD  simvastatin (ZOCOR) 10 MG tablet Take 1 tablet (10 mg total) by mouth at bedtime. 03/11/13  Yes Posey Boyer, MD  chlorhexidine (PERIDEX) 0.12 % solution Use as directed 15 mLs in the mouth or throat as needed.    Historical Provider, MD  fluconazole (DIFLUCAN) 100 MG tablet Take 1 tablet (100 mg total) by mouth daily. Patient not taking: Reported on 03/29/2015 12/25/14   Heath Lark, MD  indomethacin (INDOCIN) 50 MG capsule Take one tablet twice daily only when needed for bad arthritis flares 03/11/13   Posey Boyer, MD   BP 122/61 mmHg  Pulse 109  Temp(Src) 98.1 F (36.7 C) (Oral)  Resp 18  SpO2 95% Physical Exam  Constitutional: He is oriented to person, place, and time. He appears well-developed and well-nourished.  Non-toxic appearance. No distress.  HENT:  Head: Normocephalic and atraumatic.    Eyes: Conjunctivae, EOM and lids are normal. Pupils are equal, round, and reactive to light.  Neck: Normal range of motion. Neck supple. No tracheal deviation present. No thyroid mass present.  Cardiovascular: Regular rhythm and normal heart  sounds.  Tachycardia present.  Exam reveals no gallop.   No murmur heard. Pulmonary/Chest: Effort normal and breath sounds normal. No stridor. No respiratory distress. He has no decreased breath sounds. He has no wheezes. He has no rhonchi. He has no rales.  Abdominal: Soft. Normal appearance and bowel sounds are normal. He exhibits no distension. There is no tenderness. There is no rebound and no CVA tenderness.  Musculoskeletal: Normal range of motion. He exhibits no edema or tenderness.  Neurological: He is alert and oriented to  person, place, and time. He has normal strength. No cranial nerve deficit or sensory deficit. GCS eye subscore is 4. GCS verbal subscore is 5. GCS motor subscore is 6.  Skin: Skin is warm and dry. No abrasion and no rash noted.  Psychiatric: He has a normal mood and affect. His speech is normal and behavior is normal.  Nursing note and vitals reviewed.   ED Course  Procedures (including critical care time) Labs Review Labs Reviewed  I-STAT CHEM 8, ED    Imaging Review Ct Head Wo Contrast  03/29/2015   CLINICAL DATA:  69 year old who fell while walking outside earlier today, striking the back of the head. Patient denies loss of consciousness. Initial encounter.  EXAM: CT HEAD WITHOUT CONTRAST  CT CERVICAL SPINE WITHOUT CONTRAST  TECHNIQUE: Multidetector CT imaging of the head and cervical spine was performed following the standard protocol without intravenous contrast. Multiplanar CT image reconstructions of the cervical spine were also generated.  COMPARISON:  None.  FINDINGS: CT HEAD FINDINGS  Moderate cortical, deep and cerebellar atrophy. Moderate changes of small vessel disease of the cerebral white matter. Panventricular enlargement, felt to be due to the deep atrophy. No mass lesion. No midline shift. No acute hemorrhage or hematoma. No extra-axial fluid collections. No evidence of acute infarction.  No skull fracture or other focal osseous abnormality involving the skull. Visualized paranasal sinuses, bilateral mastoid air cells and bilateral middle ear cavities well-aerated. Minimal right carotid siphon atherosclerosis and moderate to severe vertebrobasilar atherosclerosis.  CT CERVICAL SPINE FINDINGS  No fractures identified involving the cervical spine. 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 Sagittal reconstructed images demonstrate anatomic posterior alignment. Disc space narrowing at every cervical level from C3-4 through C7-T1, worst at C5-6. Desiccated disc protrusion/extrusion at C3-4, the desiccated fragment located in the left lateral recess. Mild multifactorial spinal stenosis at C3-4. Coronal images demonstrate an intact craniocervical junction, intact C1-C2 articulation with degenerative changes, intact dens, and intact lateral masses. Facet joints intact throughout with severe degenerative changes at C4-5, C5-6 and C6-7. Uncinate and facet hypertrophy account for multilevel foraminal stenoses including severe left and moderate right C3-4, severe bilateral C4-5, severe right and moderate left C5-6, moderate left C6-7.  IMPRESSION: 1. No acute intracranial abnormality. 2. Moderate generalized brain atrophy and moderate chronic microvascular ischemic changes of the white matter diffusely. 3. No cervical spine fractures identified. 4. 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 5. Multilevel degenerative disc disease, spondylosis and facet degenerative changes resulting in mild multifactorial spinal stenosis at C3-4 and multilevel foraminal stenoses as detailed above.   Electronically Signed   By: Evangeline Dakin M.D.   On: 03/29/2015 19:10   Ct Cervical Spine Wo Contrast  03/29/2015   CLINICAL DATA:  69 year old who fell while  walking outside earlier today, striking the back of the head. Patient denies loss of consciousness. Initial encounter.  EXAM: CT HEAD WITHOUT CONTRAST  CT CERVICAL SPINE WITHOUT CONTRAST  TECHNIQUE: Multidetector CT imaging of the head and cervical spine was performed following the standard protocol without intravenous contrast. Multiplanar CT image reconstructions of the cervical spine were also generated.  COMPARISON:  None.  FINDINGS: CT HEAD FINDINGS  Moderate cortical, deep and cerebellar atrophy. Moderate changes of small vessel  disease of the cerebral white matter. Panventricular enlargement, felt to be due to the deep atrophy. No mass lesion. No midline shift. No acute hemorrhage or hematoma. No extra-axial fluid collections. No evidence of acute infarction.  No skull fracture or other focal osseous abnormality involving the skull. Visualized paranasal sinuses, bilateral mastoid air cells and bilateral middle ear cavities well-aerated. Minimal right carotid siphon atherosclerosis and moderate to severe vertebrobasilar atherosclerosis.  CT CERVICAL SPINE FINDINGS  No fractures identified involving the cervical spine. 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 Sagittal reconstructed images demonstrate anatomic posterior alignment. Disc space narrowing at every cervical level from C3-4 through C7-T1, worst at C5-6. Desiccated disc protrusion/extrusion at C3-4, the desiccated fragment located in the left lateral recess. Mild multifactorial spinal stenosis at C3-4. Coronal images demonstrate an intact craniocervical junction, intact C1-C2 articulation with degenerative changes, intact dens, and intact lateral masses. Facet joints intact throughout with severe degenerative changes at C4-5, C5-6 and C6-7. Uncinate and facet hypertrophy account for multilevel foraminal stenoses including severe left and moderate right C3-4, severe bilateral C4-5, severe right and moderate left C5-6, moderate left C6-7.  IMPRESSION: 1. No acute intracranial abnormality. 2. Moderate generalized brain atrophy and moderate chronic microvascular ischemic changes of the white matter diffusely. 3. No cervical spine fractures identified. 4. 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 5. Multilevel degenerative disc disease, spondylosis and  facet degenerative changes resulting in mild multifactorial spinal stenosis at C3-4 and multilevel foraminal stenoses as detailed above.   Electronically Signed   By: Evangeline Dakin M.D.   On: 03/29/2015 19:10     EKG Interpretation None      MDM   Final diagnoses:  Trauma  Trauma    LACERATION REPAIR Performed by: Leota Jacobsen Authorized by: Leota Jacobsen Consent: Verbal consent obtained. Risks and benefits: risks, benefits and alternatives were discussed Consent given by: patient Patient identity confirmed: provided demographic data Prepped and Draped in normal sterile fashion Wound explored  Laceration Location: Scalp  Laceration Length: 4cm  No Foreign Bodies seen or palpated  Anesthesia: local infiltration     Irrigation method: syringe Amount of cleaning: standard  Skin closure: Staples   Number of sutures: 5   Technique:   Patient tolerance: Patient tolerated the procedure well with no immediate complications.  Patient given IV fluids here and renal function noted. Patient also with hyperkalemia and treated with Kayexalate. Will be admitted to step down   CRITICAL CARE Performed by: Leota Jacobsen Total critical care time: 50 Critical care time was exclusive of separately billable procedures and treating other patients. Critical care was necessary to treat or prevent imminent or life-threatening deterioration. Critical care was time spent personally by me on the following activities: development of treatment plan with patient and/or surrogate as well as nursing, discussions with consultants, evaluation of  patient's response to treatment, examination of patient, obtaining history from patient or surrogate, ordering and performing treatments and interventions, ordering and review of laboratory studies, ordering and review of radiographic studies, pulse oximetry and re-evaluation of patient's condition.    Lacretia Leigh, MD 03/29/15 2014
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
## 2015-03-29 NOTE — ED Notes (Signed)
Patient transported to X-ray 

## 2015-03-29 NOTE — ED Notes (Signed)
Admitting physician at the bedside.

## 2015-03-30 DIAGNOSIS — W19XXXA Unspecified fall, initial encounter: Secondary | ICD-10-CM | POA: Insufficient documentation

## 2015-03-30 DIAGNOSIS — C9110 Chronic lymphocytic leukemia of B-cell type not having achieved remission: Secondary | ICD-10-CM

## 2015-03-30 DIAGNOSIS — S0191XD Laceration without foreign body of unspecified part of head, subsequent encounter: Secondary | ICD-10-CM

## 2015-03-30 DIAGNOSIS — N179 Acute kidney failure, unspecified: Principal | ICD-10-CM

## 2015-03-30 DIAGNOSIS — E875 Hyperkalemia: Secondary | ICD-10-CM | POA: Diagnosis present

## 2015-03-30 LAB — CBC WITH DIFFERENTIAL/PLATELET
BASOS ABS: 0 10*3/uL (ref 0.0–0.1)
Basophils Relative: 0 % (ref 0–1)
Eosinophils Absolute: 0 10*3/uL (ref 0.0–0.7)
Eosinophils Relative: 0 % (ref 0–5)
HCT: 41.8 % (ref 39.0–52.0)
Hemoglobin: 14 g/dL (ref 13.0–17.0)
LYMPHS PCT: 87 % — AB (ref 12–46)
Lymphs Abs: 69.5 10*3/uL — ABNORMAL HIGH (ref 0.7–4.0)
MCH: 30.4 pg (ref 26.0–34.0)
MCHC: 33.5 g/dL (ref 30.0–36.0)
MCV: 90.7 fL (ref 78.0–100.0)
MONOS PCT: 2 % — AB (ref 3–12)
Monocytes Absolute: 1.6 10*3/uL — ABNORMAL HIGH (ref 0.1–1.0)
Neutro Abs: 8.8 10*3/uL — ABNORMAL HIGH (ref 1.7–7.7)
Neutrophils Relative %: 11 % — ABNORMAL LOW (ref 43–77)
PLATELETS: 101 10*3/uL — AB (ref 150–400)
RBC: 4.61 MIL/uL (ref 4.22–5.81)
RDW: 15.7 % — ABNORMAL HIGH (ref 11.5–15.5)
WBC: 79.9 10*3/uL — AB (ref 4.0–10.5)

## 2015-03-30 LAB — PATHOLOGIST SMEAR REVIEW

## 2015-03-30 LAB — COMPREHENSIVE METABOLIC PANEL
ALK PHOS: 79 U/L (ref 38–126)
ALT: 25 U/L (ref 17–63)
ANION GAP: 11 (ref 5–15)
AST: 34 U/L (ref 15–41)
Albumin: 4 g/dL (ref 3.5–5.0)
BILIRUBIN TOTAL: 0.8 mg/dL (ref 0.3–1.2)
BUN: 37 mg/dL — ABNORMAL HIGH (ref 6–20)
CO2: 21 mmol/L — ABNORMAL LOW (ref 22–32)
CREATININE: 1.27 mg/dL — AB (ref 0.61–1.24)
Calcium: 8.7 mg/dL — ABNORMAL LOW (ref 8.9–10.3)
Chloride: 105 mmol/L (ref 101–111)
GFR calc non Af Amer: 56 mL/min — ABNORMAL LOW (ref >60)
GLUCOSE: 105 mg/dL — AB (ref 65–99)
POTASSIUM: 4.4 mmol/L (ref 3.5–5.1)
Sodium: 137 mmol/L (ref 135–145)
Total Protein: 6.8 g/dL (ref 6.5–8.1)

## 2015-03-30 LAB — FOLATE: Folate: >80 ng/mL (ref >5.9)

## 2015-03-30 LAB — PHOSPHORUS: PHOSPHORUS: 2.8 mg/dL (ref 2.5–4.6)

## 2015-03-30 LAB — VITAMIN B12: Vitamin B-12: 754 pg/mL (ref 180–914)

## 2015-03-30 NOTE — Discharge Instructions (Signed)

## 2015-03-30 NOTE — Discharge Summary (Signed)
Physician Discharge Summary  STARR ENGEL LFY:[id number] DOB: Feb 07, 1946 DOA: 03/29/2015  PCP: No primary care provider on file.  Admit date: 03/29/2015 Discharge date: 03/30/2015  Recommendations for Outpatient Follow-up:  You may continue Norvasc for blood pressure but take only if blood pressure above 120/80. Hold lisinopril and lasix and potassium for now until seen by PCP who can recheck your kidney function and potassium level and will instruct you if you can take it again Please have cardiology evaluation as referred by PCP for 2 D ECHO  Discharge Diagnoses:  Principal Problem:   Acute kidney injury Active Problems:   HTN (hypertension)   Fatigue   History of tongue cancer   CLL (chronic lymphocytic leukemia)   Thrombocytopenia   Hyperkalemia    Discharge Condition: stable   Diet recommendation: as tolerated   History of present illness:  69 y.o. male with past medical history of tongue cancer (resected and followed by adjuvant radiation therapy 10 years ago), history of  CLL (under Dr. Ernst Spell Gorsuch's care) who presented to [facility] ED status post fall at home. He sustained head laceration. Patient reported feeling lightheaded and dizzy prior to the fall but no loss of consciousness. His daughter at the bedside this morning said "he is on too many blood pressure medications and his BP comes down too low". No seizure like activity. No chest pain, palpitations. No abdominal pain, nausea or vomiting. No blood pre rectum. No fevers or chills. His blood work was significant for WBC count 74.5, creatinine of 1.67, potassium of 6.1 (repeat value of 5.9). His CT head showed no acute intracranial findings.    Hospital Course:   CLL (chronic lymphocytic leukemia) - Lymphocyte count has increased from prior labs from 44.7 on 4/4 to 74.5 on this admission which may be reactive component following the fall - As per oncology, he will follow up in cancer center - He is not symptomatic from  leukemia  History of tongue cancer - Clinically, pt has no signs of recurrence.  Thrombocytopenia - Likely due to CLL - Not symptomatic - Observation     Acute Kidney Injury - Likely due to lasix and lisinopril - Suggested to hold those meds on discharge until renal function at baseline  Hyperkalemia - Due to potassium supplements - Pt was placed on Kayaxelate with resolution of hyperkalemia  Fall - CT head and C spine unremarkable. - Likely due to orthostatic hypotension and multiple BP meds likely taken while BP already low  Essential hypertension - Held lasix and lisinopril until renal function normalizes - Continue Norvasc on discharge    Failure to thrive in adult / Moderate protein calorie malnutrition - In the context of chronic illness - Liberalize the diet since pt with poor PO intake    Signed:  Leisa Lenz, MD  [facility] 03/30/2015, 4:00 PM  Pager #: [phone]  Time spent in minutes: less than 30 minutes  Procedures:  None   Consultations:  Oncology, Dr. Heath Lark   Discharge Exam: Filed Vitals:   03/30/15 1321  BP: 95/54  Pulse: 85  Temp: 97.5 F (36.4 C)  Resp: 20   Filed Vitals:   03/29/15 2332 03/30/15 0412 03/30/15 1010 03/30/15 1321  BP: 110/60 117/67 113/65 95/54  Pulse: 102 101  85  Temp: 98.2 F (36.8 C) 98.1 F (36.7 C)  97.5 F (36.4 C)  TempSrc: Oral Oral  Oral  Resp: 20 22  20   Height: 6\' 6"  (1.981 m)  Weight: 148.9 kg (328 lb 4.2 oz) 148.9 kg (328 lb 4.2 oz)    SpO2: 100% 96%  97%    General: Pt is alert, follows commands appropriately, not in acute distress Cardiovascular: Regular rate and rhythm, S1/S2 +, no murmurs Respiratory: Clear to auscultation bilaterally, no wheezing, no crackles, no rhonchi Abdominal: Soft, non tender, non distended, bowel sounds +, no guarding Extremities: no edema, no cyanosis, pulses palpable bilaterally DP and PT Neuro: Grossly nonfocal  Discharge  Instructions  Discharge Instructions    Call MD for:  difficulty breathing, headache or visual disturbances    Complete by:  As directed      Call MD for:  persistant dizziness or light-headedness    Complete by:  As directed      Call MD for:  persistant nausea and vomiting    Complete by:  As directed      Call MD for:  severe uncontrolled pain    Complete by:  As directed      Diet - low sodium heart healthy    Complete by:  As directed      Discharge instructions    Complete by:  As directed   You may continue Norvasc for blood pressure but take only if blood pressure above 120/80. Hold lisinopril and lasix and potassium for now until seen by PCP who can recheck your kidney function and potassium level and will instruct you if you can take it again Please have cardiology evaluation as referred by PCP for 2 D ECHO     Increase activity slowly    Complete by:  As directed             Medication List    STOP taking these medications        fluconazole 100 MG tablet  Commonly known as:  DIFLUCAN     furosemide 40 MG tablet  Commonly known as:  LASIX     lisinopril 20 MG tablet  Commonly known as:  PRINIVIL,ZESTRIL     potassium chloride SA 20 MEQ tablet  Commonly known as:  K-DUR,KLOR-CON      TAKE these medications        allopurinol 300 MG tablet  Commonly known as:  ZYLOPRIM  Take 300 mg by mouth daily.     amLODipine 5 MG tablet  Commonly known as:  NORVASC  Take 5 mg by mouth daily.     chlorhexidine 0.12 % solution  Commonly known as:  PERIDEX  Use as directed 15 mLs in the mouth or throat as needed.     indomethacin 50 MG capsule  Commonly known as:  INDOCIN  Take one tablet twice daily only when needed for bad arthritis flares     simvastatin 10 MG tablet  Commonly known as:  ZOCOR  Take 1 tablet (10 mg total) by mouth at bedtime.           Follow-up Information    Follow up with [facility].   Why:  HHPT/HHOT/HH Nurse's  aide.   Contact information:   [address] [place] [address] [phone]       Follow up with [facility].   Why:  rolling walker   Contact information:   [address] [place] [address] [phone]        The results of significant diagnostics from this hospitalization (including imaging, microbiology, ancillary and laboratory) are listed below for reference.  Significant Diagnostic Studies: X-ray Chest Pa And Lateral  03/29/2015   CLINICAL DATA:  Fall, fatigue, dyspnea on exertion.  EXAM: CHEST  2 VIEW  COMPARISON:  None.  FINDINGS: No cardiomegaly. Negative aortic and hilar contours. There is no edema, consolidation, effusion, or pneumothorax. Thoracic dextroscoliosis. No acute superimposed findings.  IMPRESSION: No active cardiopulmonary disease.   Electronically Signed   By: Monte Fantasia M.D.   On: 03/29/2015 22:39   Ct Head Wo Contrast  03/29/2015   CLINICAL DATA:  69 year old who fell while walking outside earlier today, striking the back of the head. Patient denies loss of consciousness. Initial encounter.  EXAM: CT HEAD WITHOUT CONTRAST  CT CERVICAL SPINE WITHOUT CONTRAST  TECHNIQUE: Multidetector CT imaging of the head and cervical spine was performed following the standard protocol without intravenous contrast. Multiplanar CT image reconstructions of the cervical spine were also generated.  COMPARISON:  None.  FINDINGS: CT HEAD FINDINGS  Moderate cortical, deep and cerebellar atrophy. Moderate changes of small vessel disease of the cerebral white matter. Panventricular enlargement, felt to be due to the deep atrophy. No mass lesion. No midline shift. No acute hemorrhage or hematoma. No extra-axial fluid collections. No evidence of acute infarction.  No skull fracture or other focal osseous abnormality involving the skull. Visualized paranasal sinuses, bilateral mastoid air cells and bilateral middle ear cavities well-aerated. Minimal  right carotid siphon atherosclerosis and moderate to severe vertebrobasilar atherosclerosis.  CT CERVICAL SPINE FINDINGS  No fractures identified involving the cervical spine. 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 Sagittal reconstructed images demonstrate anatomic posterior alignment. Disc space narrowing at every cervical level from C3-4 through C7-T1, worst at C5-6. Desiccated disc protrusion/extrusion at C3-4, the desiccated fragment located in the left lateral recess. Mild multifactorial spinal stenosis at C3-4. Coronal images demonstrate an intact craniocervical junction, intact C1-C2 articulation with degenerative changes, intact dens, and intact lateral masses. Facet joints intact throughout with severe degenerative changes at C4-5, C5-6 and C6-7. Uncinate and facet hypertrophy account for multilevel foraminal stenoses including severe left and moderate right C3-4, severe bilateral C4-5, severe right and moderate left C5-6, moderate left C6-7.  IMPRESSION: 1. No acute intracranial abnormality. 2. Moderate generalized brain atrophy and moderate chronic microvascular ischemic changes of the white matter diffusely. 3. No cervical spine fractures identified. 4. 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 5. Multilevel degenerative disc disease, spondylosis and facet degenerative changes resulting in mild multifactorial spinal stenosis at C3-4 and multilevel foraminal stenoses as detailed above.   Electronically Signed   By: Evangeline Dakin M.D.   On: 03/29/2015 19:10   Ct Cervical Spine Wo Contrast  03/29/2015   CLINICAL DATA:  69 year old who fell while walking outside earlier today, striking the back of the head. Patient denies loss of consciousness. Initial encounter.  EXAM:  CT HEAD WITHOUT CONTRAST  CT CERVICAL SPINE WITHOUT CONTRAST  TECHNIQUE: Multidetector CT imaging of the head and cervical spine was performed following the standard protocol without intravenous contrast. Multiplanar CT image reconstructions of the cervical spine were also generated.  COMPARISON:  None.  FINDINGS: CT HEAD FINDINGS  Moderate cortical, deep and cerebellar atrophy. Moderate changes of small vessel disease of the cerebral white matter. Panventricular enlargement, felt to be due to the deep atrophy. No mass lesion. No midline shift.  No acute hemorrhage or hematoma. No extra-axial fluid collections. No evidence of acute infarction.  No skull fracture or other focal osseous abnormality involving the skull. Visualized paranasal sinuses, bilateral mastoid air cells and bilateral middle ear cavities well-aerated. Minimal right carotid siphon atherosclerosis and moderate to severe vertebrobasilar atherosclerosis.  CT CERVICAL SPINE FINDINGS  No fractures identified involving the cervical spine. 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 Sagittal reconstructed images demonstrate anatomic posterior alignment. Disc space narrowing at every cervical level from C3-4 through C7-T1, worst at C5-6. Desiccated disc protrusion/extrusion at C3-4, the desiccated fragment located in the left lateral recess. Mild multifactorial spinal stenosis at C3-4. Coronal images demonstrate an intact craniocervical junction, intact C1-C2 articulation with degenerative changes, intact dens, and intact lateral masses. Facet joints intact throughout with severe degenerative changes at C4-5, C5-6 and C6-7. Uncinate and facet hypertrophy account for multilevel foraminal stenoses including severe left and moderate right  C3-4, severe bilateral C4-5, severe right and moderate left C5-6, moderate left C6-7.  IMPRESSION: 1. No acute intracranial abnormality. 2. Moderate generalized brain atrophy and moderate chronic microvascular ischemic changes of the white matter diffusely. 3. No cervical spine fractures identified. 4. 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 5. Multilevel degenerative disc disease, spondylosis and facet degenerative changes resulting in mild multifactorial spinal stenosis at C3-4 and multilevel foraminal stenoses as detailed above.   Electronically Signed   By: Evangeline Dakin M.D.   On: 03/29/2015 19:10    Microbiology: No results found for this or any previous visit (from the past 240 hour(s)).   Labs: Basic Metabolic Panel:  Recent Labs Lab 03/29/15 1800 03/29/15 1852 03/29/15 1903 03/29/15 2117 03/30/15 0645  NA 134* 134*  --  135 137  K 5.5* 6.1*  --  5.9* 4.4  CL 100* 101  --  102 105  CO2  --  26  --  23 21*  GLUCOSE 129* 132*  --  119* 105*  BUN 61* 48*  --  47* 37*  CREATININE 1.60* 1.67*  --  1.50* 1.27*  CALCIUM  --  9.1  --  9.0 8.7*  PHOS  --   --  3.5 3.2 2.8   Liver Function Tests:  Recent Labs Lab 03/29/15 1852 03/30/15 0645  AST 33 34  ALT 25 25  ALKPHOS 96 79  BILITOT 0.9 0.8  PROT 7.8 6.8  ALBUMIN 4.6 4.0   No results for input(s): LIPASE, AMYLASE in the last 168 hours. No results for input(s): AMMONIA in the last 168 hours. CBC:  Recent Labs Lab 03/29/15 1800 03/29/15 1852 03/30/15 0645  WBC  --  74.5* 79.9*  NEUTROABS  --  9.7* 8.8*  HGB 16.0 14.2 14.0  HCT 47.0 42.7 41.8  MCV  --  89.3 90.7  PLT  --  113* 101*   Cardiac Enzymes: No results for input(s): CKTOTAL, CKMB, CKMBINDEX, TROPONINI in the last 168 hours. BNP: BNP (last 3 results) No results for input(s): BNP in the last 8760 hours.  ProBNP (last 3 results) No results for input(s): PROBNP in the last 8760 hours.  CBG: No results for input(s): GLUCAP in the last  168 hours.

## 2015-03-30 NOTE — Progress Notes (Signed)
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 Dizziness, lightheadedness and fatigue precluded the event.  Denies fevers, chills, night sweats, vision changes, or mucositis. He has been reporting dyspnea on exertion. Denies any chest pain or palpitations. Denies lower extremity swelling. Denies nausea, heartburn or change in bowel habits. Denies abdominal pain. Appetite is normal. Denies any dysuria. Denies abnormal skin rashes, or neuropathy. Denies any bleeding issues such as epistaxis, hematemesis, hematuria or hematochezia.He has reported a 6 month history of insomnia. No confusion was reported. CT head and C spine were negative for acute intracranial abnormalities. His fall was felt to be orthostatic at this time.  His labs were remarkable for acute kidney injury, with hyperkalemia, now normalized. His uric acid and calcium is normal- he is on allopurinol. His CBC shows a WBC of 79.9 with stable lymphocyte count. Smear ordered for review.  We have been kindly informed of the patient's admission.  SUMMARY OF ONCOLOGIC HISTORY:  This patient was noted to have progressive leukocytosis and was found to have CLL. He was not symptomatic. The patient also had background history of tongue cancer, resected followed by adjuvant radiation therapy 10 years ago with no recurrence of disease.  MEDICATIONS: Scheduled Meds: . allopurinol  300 mg Oral Daily  . amLODipine  5 mg Oral Daily  . antiseptic oral rinse  7 mL Mouth Rinse q12n4p  . chlorhexidine  15 mL Mouth Rinse BID  . simvastatin  10 mg Oral QHS  . sodium  chloride  3 mL Intravenous Q12H   Continuous Infusions: . sodium chloride 100 mL/hr at 03/29/15 2319   PRN Meds:.acetaminophen **OR** acetaminophen, ondansetron **OR** ondansetron (ZOFRAN) IV, zolpidem ALLERGIES:  No Known Allergies   PHYSICAL EXAMINATION:  Filed Vitals:   03/30/15 1010  BP: 113/65  Pulse:   Temp:   Resp:    Filed Weights   03/29/15 2332 03/30/15 0412  Weight: 328 lb 4.2 oz (148.9 kg) 328 lb 4.2 oz (148.9 kg)    GENERAL:alert, no distress and comfortable SKIN: skin color, texture, turgor are normal, no rashes or significant lesions EYES: normal, conjunctiva are pink and non-injected, sclera clear OROPHARYNX:no exudate, no erythema and lips, buccal mucosa, and tongue normal  NECK:significant neck deformity from prior surgery and radiation, without nodularity LYMPH:  no palpable lymphadenopathy in the cervical,or inguinal. Known palpable axillary adenopathy LUNGS: clear to auscultation and percussion with normal breathing effort HEART: regular rate & rhythm and no murmurs and no lower extremity edema ABDOMEN: soft, non-tender and normal bowel sounds Musculoskeletal:no cyanosis of digits and no clubbing  PSYCH: alert & oriented x 3 with fluent speech NEURO: no focal motor/sensory deficits   LABORATORY/RADIOLOGY DATA:   Recent Labs Lab 03/29/15 1800 03/29/15 1852 03/30/15 0645  WBC  --  74.5* 79.9*  HGB 16.0 14.2 14.0  HCT 47.0 42.7 41.8  PLT  --  113* 101*  MCV  --  89.3 90.7  MCH  --  29.7 30.4  MCHC  --  33.3 33.5  RDW  --  15.7* 15.7*  LYMPHSABS  --  63.3* 69.5*  MONOABS  --  1.5* 1.6*  EOSABS  --  0.0 0.0  BASOSABS  --  0.0 0.0    CMP    Recent Labs Lab 03/29/15 1800 03/29/15 1852 03/29/15 2117 03/30/15 0645  NA 134* 134* 135 137  K 5.5* 6.1* 5.9* 4.4  CL 100* 101 102 105  CO2  --  26 23 21*  GLUCOSE 129* 132* 119* 105*  BUN 61* 48* 47* 37*  CREATININE 1.60* 1.67* 1.50* 1.27*  CALCIUM  --  9.1 9.0 8.7*  AST  --  33  --  34    ALT  --  25  --  25  ALKPHOS  --  96  --  79  BILITOT  --  0.9  --  0.8        Component Value Date/Time   BILITOT 0.8 03/30/2015 0645   BILITOT 0.82 12/25/2014 0951    Anemia panel:    Recent Labs  03/29/15 2117  VITAMINB12 754  FOLATE >80.0     Recent Labs  03/29/15 2117  TSH 2.419    Liver Function Tests:  Recent Labs Lab 03/29/15 1852 03/30/15 0645  AST 33 34  ALT 25 25  ALKPHOS 96 79  BILITOT 0.9 0.8  PROT 7.8 6.8  ALBUMIN 4.6 4.0    Thyroid function studies  Recent Labs  03/29/15 2117  TSH 2.419    Radiology Studies:  X-ray Chest Pa And Lateral  03/29/2015      EXAM: CHEST  2 VIEW  COMPARISON:  None.  FINDINGS: No cardiomegaly. Negative aortic and hilar contours. There is no edema, consolidation, effusion, or pneumothorax. Thoracic dextroscoliosis. No acute superimposed findings.  IMPRESSION: No active cardiopulmonary disease.   Electronically Signed   By: Monte Fantasia M.D.   On: 03/29/2015 22:39   Ct Head Wo Contrast  03/29/2015    COMPARISON:  None.  FINDINGS: CT HEAD FINDINGS  Moderate cortical, deep and cerebellar atrophy. Moderate changes of small vessel disease of the cerebral white matter. Panventricular enlargement, felt to be due to the deep atrophy. No mass lesion. No midline shift. No acute hemorrhage or hematoma. No extra-axial fluid collections. No evidence of acute infarction.  No skull fracture or other focal osseous abnormality involving the skull. Visualized paranasal sinuses, bilateral mastoid air cells and bilateral middle ear cavities well-aerated. Minimal right carotid siphon atherosclerosis and moderate to severe vertebrobasilar atherosclerosis.  CT CERVICAL SPINE FINDINGS  No fractures identified involving the cervical spine. 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 Sagittal reconstructed images demonstrate anatomic posterior alignment. Disc space narrowing at every cervical level from C3-4 through C7-T1, worst at C5-6. Desiccated disc protrusion/extrusion at C3-4, the desiccated fragment located in the left lateral recess. Mild multifactorial spinal stenosis at C3-4. Coronal images demonstrate an intact craniocervical junction, intact C1-C2 articulation with degenerative changes, intact dens, and intact lateral masses. Facet joints intact throughout with severe degenerative changes at C4-5, C5-6 and C6-7. Uncinate and facet hypertrophy account for multilevel foraminal stenoses including severe left and moderate right C3-4, severe bilateral C4-5, severe right and moderate left C5-6, moderate left C6-7.  IMPRESSION: 1. No acute intracranial abnormality. 2. Moderate generalized brain atrophy and moderate chronic microvascular ischemic changes of the white matter diffusely. 3. No cervical spine fractures identified. 4. 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 5. Multilevel degenerative disc disease, spondylosis and facet degenerative changes resulting in mild multifactorial spinal stenosis at C3-4 and multilevel foraminal stenoses as detailed above.   Electronically Signed   By: Evangeline Dakin M.D.   On: 03/29/2015 19:10   Ct Cervical Spine Wo Contrast  03/29/2015   COMPARISON:  None.  FINDINGS: CT HEAD FINDINGS  Moderate cortical, deep and cerebellar atrophy. Moderate changes of small vessel disease of the cerebral white matter. Panventricular enlargement, felt to be due to the deep atrophy. No mass lesion. No midline shift. No acute hemorrhage or hematoma. No extra-axial fluid collections. No evidence of acute infarction.  No skull fracture or other focal osseous abnormality involving the skull. Visualized paranasal sinuses, bilateral mastoid air cells and bilateral middle ear cavities well-aerated.  Minimal right carotid siphon atherosclerosis and moderate to severe vertebrobasilar atherosclerosis.  CT CERVICAL SPINE FINDINGS  No fractures identified involving the cervical spine. 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 Sagittal reconstructed images demonstrate anatomic posterior alignment. Disc space narrowing at every cervical level from C3-4 through C7-T1, worst at C5-6. Desiccated disc protrusion/extrusion at C3-4, the desiccated fragment located in the left lateral recess. Mild multifactorial spinal stenosis at C3-4. Coronal images demonstrate an intact craniocervical junction, intact C1-C2 articulation with degenerative changes, intact dens, and intact lateral masses. Facet joints intact throughout with severe degenerative changes at C4-5, C5-6 and C6-7. Uncinate and facet hypertrophy account for multilevel foraminal stenoses including severe left and moderate right C3-4, severe bilateral C4-5, severe right and moderate left C5-6, moderate left C6-7.  IMPRESSION: 1. No acute intracranial abnormality. 2. Moderate generalized brain atrophy and moderate chronic microvascular ischemic changes of the white matter diffusely. 3. No cervical spine fractures identified. 4. 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 5. Multilevel degenerative disc disease, spondylosis and facet degenerative changes resulting in mild multifactorial spinal stenosis at C3-4 and multilevel foraminal stenoses as detailed above.   Electronically Signed   By: Evangeline Dakin M.D.   On: 03/29/2015 19:10   ASSESSMENT AND PLAN:  CLL (chronic lymphocytic leukemia) His lymphocyte count has increased from prior labs from 44.7 on 4/4 to 63.3 on admission, which may have a reactive component following his  fall He is not symptomatic for leukemia. He has small palpable lymph nodes in his axilla which have remained stable since last visit. Will keep appointment at the [facility] as scheduled.  History of tongue cancer Clinically, he has no signs of recurrence. I will continue observation only.  Thrombocytopenia This is likely due to disease.  He is not symptomatic.  Recommend observation only.  Other fatigue Insomnia He has chronic fatigue. TSH is within normal limits. He has been unable to sleep well for the last 6 months. Recommend Sleep studies once discharged from hospital.  Acute Kidney Injury Acute on chronic renal failure Likely due to dehydration due to antihypertensives and diuretics Resolved with hydration  Hyperkalemia Due to potassium supplements He was placed on Kayaxelate with resolution No signs of tumor lysis syndrome, uric acid is normal.  Fall CT head and C spine unremarkable. Likely due to orthostatic hypotension due to dehydration but cardiac origin cannot be ruled out EKG shows Sinus tachycardia,Left anterior fascicular block. Abnormal R-wave progression, early transition nonspecific T abnormalities, lateral leads.  These abnormalities could be  related to hyperkalemia. Patient never had an echo.  He may need cardiology evaluation due to above abnormalities Antihypertensives on hold. IV fluids given. No further falls were noted noted. Ambulate with assistance.   DVT prophylaxis On Heparin  Full Code  Other medical issues as per admitting team  We'll sign off. Rondel Jumbo, PA-C 03/30/2015, 10:59 AM Marsella Suman, MD 03/30/2015

## 2015-03-30 NOTE — Evaluation (Addendum)
Physical Therapy Evaluation Patient Details Name: Dale George MRN: [id number] DOB: 19-Apr-1946 Today's Date: 03/30/2015   History of Present Illness  69 yo male admitted with acute kidney injury, fall, head laceration. Hx of CLL, HTn, tongue cancer. Pt lives alone.   Clinical Impression  On eval, pt required Mod assist for bed mobility and Min assist to ambulate ~52 feet with RW. Pt is generally weak, unsteady, and remains at risk for falls. Discussed d/c plan-recommended ST rehab at SNF however pt and daughter politely declined placement at this time. Pt prefers to d/c home and daughter states she and family can provide assist as needed. Recommend HHPT, [place], [place] (if possible), and RW.    Follow Up Recommendations Supervision/Assistance - 24 hour;Home health PT;Home Health OT; Home health Aide (Pt/daughter decline placement at this time)    Equipment Recommendations  Rolling walker with 5" wheels    Recommendations for Other Services       Precautions / Restrictions Precautions Precautions: Fall Restrictions Weight Bearing Restrictions: No      Mobility  Bed Mobility Overal bed mobility: Needs Assistance Bed Mobility: Supine to Sit     Supine to sit: HOB elevated;Mod assist     General bed mobility comments: Assist for trunk and bil LEs. Increased time. VCS technique.  Transfers Overall transfer level: Needs assistance Equipment used: Rolling walker (2 wheeled) Transfers: Sit to/from Stand Sit to Stand: Min assist;From elevated surface         General transfer comment: Assist to rise, stabilize, control descent. VCs safety, hand placement  Ambulation/Gait Ambulation/Gait assistance: Min assist Ambulation Distance (Feet): 52 Feet Assistive device: Rolling walker (2 wheeled) Gait Pattern/deviations: Step-through pattern;Decreased stride length;Decreased step length - left;Decreased step length - right     General Gait Details: Assist to stabilize.  Pt is unsteady, LEs are weak  Stairs            Wheelchair Mobility    Modified Rankin (Stroke Patients Only)       Balance Overall balance assessment: History of Falls;Needs assistance         Standing balance support: Bilateral upper extremity supported;During functional activity Standing balance-Leahy Scale: Poor Standing balance comment: needs RW for support                             Pertinent Vitals/Pain Pain Assessment: 0-10 Pain Score: 3  Pain Location: back of head Pain Descriptors / Indicators: Sore Pain Intervention(s): Monitored during session BP: 115/71 supine, 111/71 sitting, 113/71 standing    Home Living Family/patient expects to be discharged to:: Private residence Living Arrangements: Alone Available Help at Discharge: Family Type of Home: Apartment Home Access: Stairs to enter Entrance Stairs-Rails: Right Entrance Stairs-Number of Steps: 3 Home Layout: One level Home Equipment: Cane - quad      Prior Function Level of Independence: Independent               Hand Dominance        Extremity/Trunk Assessment   Upper Extremity Assessment: Generalized weakness           Lower Extremity Assessment: Generalized weakness      Cervical / Trunk Assessment: Kyphotic  Communication   Communication: No difficulties  Cognition Arousal/Alertness: Awake/alert Behavior During Therapy: WFL for tasks assessed/performed Overall Cognitive Status: Within Functional Limits for tasks assessed  General Comments      Exercises        Assessment/Plan    PT Assessment Patient needs continued PT services  PT Diagnosis Difficulty walking;Generalized weakness;Acute pain   PT Problem List Decreased strength;Decreased activity tolerance;Decreased balance;Decreased mobility;Obesity;Decreased knowledge of use of DME;Pain  PT Treatment Interventions DME instruction;Gait training;Functional mobility  training;Therapeutic activities;Therapeutic exercise;Patient/family education;Balance training   PT Goals (Current goals can be found in the Care Plan section) Acute Rehab PT Goals Patient Stated Goal: home today PT Goal Formulation: With patient/family Time For Goal Achievement: 04/13/15 Potential to Achieve Goals: Good    Frequency Min 3X/week   Barriers to discharge        Co-evaluation               End of Session Equipment Utilized During Treatment: Gait belt Activity Tolerance: Patient tolerated treatment well Patient left: in chair;with call bell/phone within reach;with family/visitor present           Time: 0915-0938 PT Time Calculation (min) (ACUTE ONLY): 23 min   Charges:   PT Evaluation $Initial PT Evaluation Tier I: 1 Procedure PT Treatments $Gait Training: 8-22 mins   PT G Codes:        Weston Anna, MPT Pager: [phone]

## 2015-03-30 NOTE — Care Management Note (Signed)
Case Management Note  Patient Details  Name: Dale George MRN: [id number] Date of Birth: [birth year]  Subjective/Objective:  Will give CC44, but already has d/c order,unable to change.[facility] chosen. [facility] ahc rep Kristen aware of d/c & HHC orders.[facility] dme rep aware of RW order, will deliver to rm prior d/c.Nsg notified.                  Action/Plan:d/c plan home w/HHC, rw.No further d/c needs.   Expected Discharge Date:                  Expected Discharge Plan:  [place]  In-House Referral:     Discharge planning Services  CM Consult  Post Acute Care Choice:    Choice offered to:  Patient  DME Arranged:    DME Agency:     HH Arranged:  PT, OT, Nurse's Aide [place] Agency:  [place]  Status of Service:  Completed, signed off  Medicare Important Message Given:    Date Medicare IM Given:    Medicare IM give by:    Date Additional Medicare IM Given:    Additional Medicare Important Message give by:     If discussed at [place] of Stay Meetings, dates discussed:    Additional Comments:  Dessa Phi, RN 03/30/2015, 4:08 PM

## 2015-04-11 ENCOUNTER — Other Ambulatory Visit: Payer: Self-pay

## 2015-04-11 ENCOUNTER — Other Ambulatory Visit: Payer: Self-pay | Admitting: Family Medicine

## 2015-04-11 ENCOUNTER — Ambulatory Visit (HOSPITAL_COMMUNITY): Payer: Medicare PPO | Attending: Cardiovascular Disease

## 2015-04-11 DIAGNOSIS — I10 Essential (primary) hypertension: Secondary | ICD-10-CM | POA: Diagnosis not present

## 2015-04-11 DIAGNOSIS — Z87891 Personal history of nicotine dependence: Secondary | ICD-10-CM | POA: Insufficient documentation

## 2015-04-11 DIAGNOSIS — R0602 Shortness of breath: Secondary | ICD-10-CM

## 2015-04-11 DIAGNOSIS — I059 Rheumatic mitral valve disease, unspecified: Secondary | ICD-10-CM | POA: Insufficient documentation

## 2015-04-11 DIAGNOSIS — E785 Hyperlipidemia, unspecified: Secondary | ICD-10-CM | POA: Insufficient documentation

## 2015-04-11 DIAGNOSIS — I351 Nonrheumatic aortic (valve) insufficiency: Secondary | ICD-10-CM | POA: Insufficient documentation

## 2015-06-11 ENCOUNTER — Telehealth: Payer: Self-pay | Admitting: Hematology and Oncology

## 2015-06-11 NOTE — Telephone Encounter (Signed)
returned call adn s.w. pt and r/s appt....pt ok and aware of new d.t °

## 2015-06-25 ENCOUNTER — Other Ambulatory Visit: Payer: Medicare PPO

## 2015-06-25 ENCOUNTER — Ambulatory Visit: Payer: Medicare PPO | Admitting: Hematology and Oncology

## 2015-06-26 ENCOUNTER — Encounter: Payer: Self-pay | Admitting: Hematology and Oncology

## 2015-06-26 ENCOUNTER — Ambulatory Visit (HOSPITAL_BASED_OUTPATIENT_CLINIC_OR_DEPARTMENT_OTHER): Payer: Medicare PPO | Admitting: Hematology and Oncology

## 2015-06-26 ENCOUNTER — Other Ambulatory Visit (HOSPITAL_BASED_OUTPATIENT_CLINIC_OR_DEPARTMENT_OTHER): Payer: Medicare PPO

## 2015-06-26 VITALS — BP 147/90 | HR 85 | Temp 97.6°F | Resp 21 | Ht 78.0 in | Wt 340.9 lb

## 2015-06-26 DIAGNOSIS — C9110 Chronic lymphocytic leukemia of B-cell type not having achieved remission: Secondary | ICD-10-CM

## 2015-06-26 DIAGNOSIS — Z85810 Personal history of malignant neoplasm of tongue: Secondary | ICD-10-CM | POA: Diagnosis not present

## 2015-06-26 DIAGNOSIS — R5383 Other fatigue: Secondary | ICD-10-CM

## 2015-06-26 DIAGNOSIS — D696 Thrombocytopenia, unspecified: Secondary | ICD-10-CM | POA: Diagnosis not present

## 2015-06-26 DIAGNOSIS — I10 Essential (primary) hypertension: Secondary | ICD-10-CM | POA: Insufficient documentation

## 2015-06-26 DIAGNOSIS — R5382 Chronic fatigue, unspecified: Secondary | ICD-10-CM | POA: Diagnosis not present

## 2015-06-26 LAB — COMPREHENSIVE METABOLIC PANEL (CC13)
ALT: 15 U/L (ref 0–55)
ANION GAP: 7 meq/L (ref 3–11)
AST: 19 U/L (ref 5–34)
Albumin: 4.1 g/dL (ref 3.5–5.0)
Alkaline Phosphatase: 101 U/L (ref 40–150)
BILIRUBIN TOTAL: 1 mg/dL (ref 0.20–1.20)
BUN: 16.1 mg/dL (ref 7.0–26.0)
CALCIUM: 9.1 mg/dL (ref 8.4–10.4)
CHLORIDE: 105 meq/L (ref 98–109)
CO2: 26 meq/L (ref 22–29)
CREATININE: 1.1 mg/dL (ref 0.7–1.3)
EGFR: 67 mL/min/{1.73_m2} — ABNORMAL LOW (ref >90)
Glucose: 125 mg/dl (ref 70–140)
Potassium: 4.9 mEq/L (ref 3.5–5.1)
Sodium: 138 mEq/L (ref 136–145)
TOTAL PROTEIN: 6.8 g/dL (ref 6.4–8.3)

## 2015-06-26 LAB — CBC WITH DIFFERENTIAL/PLATELET
BASO%: 0.4 % (ref 0.0–2.0)
Basophils Absolute: 0.3 10*3/uL — ABNORMAL HIGH (ref 0.0–0.1)
EOS%: 0.2 % (ref 0.0–7.0)
Eosinophils Absolute: 0.2 10*3/uL (ref 0.0–0.5)
HEMATOCRIT: 44.9 % (ref 38.4–49.9)
HGB: 14.3 g/dL (ref 13.0–17.1)
LYMPH#: 60.2 10*3/uL — AB (ref 0.9–3.3)
LYMPH%: 89 % — ABNORMAL HIGH (ref 14.0–49.0)
MCH: 27.9 pg (ref 27.2–33.4)
MCHC: 31.9 g/dL — AB (ref 32.0–36.0)
MCV: 87.6 fL (ref 79.3–98.0)
MONO#: 1 10*3/uL — AB (ref 0.1–0.9)
MONO%: 1.4 % (ref 0.0–14.0)
NEUT%: 9 % — ABNORMAL LOW (ref 39.0–75.0)
NEUTROS ABS: 6.1 10*3/uL (ref 1.5–6.5)
PLATELETS: 106 10*3/uL — AB (ref 140–400)
RBC: 5.12 10*6/uL (ref 4.20–5.82)
RDW: 14.4 % (ref 11.0–14.6)
WBC: 67.7 10*3/uL — AB (ref 4.0–10.3)

## 2015-06-26 LAB — LACTATE DEHYDROGENASE (CC13): LDH: 180 U/L (ref 125–245)

## 2015-06-26 LAB — TECHNOLOGIST REVIEW

## 2015-06-26 NOTE — Progress Notes (Signed)
[place] OFFICE PROGRESS NOTE  Patient Care Team: Hoyle Sauer, MD as PCP - General (Family Medicine) Maury Dus, MD as Referring Physician (Family Medicine) Heath Lark, MD as Consulting Physician (Hematology and Oncology)  SUMMARY OF ONCOLOGIC HISTORY:   CLL (chronic lymphocytic leukemia) ([place])   12/14/2013 Pathology Results Accession: [id number] flow cytometry of blood confirmed CLL   09/25/2014 Tumor Marker FISH analysis came back negative/without common mutations for CLL panel    INTERVAL HISTORY: Please see below for problem oriented charting. The patient returns for further follow-up. He denies recent fall or syncopal episode. No new lymphadenopathy. Denies recent infection. The patient denies any recent signs or symptoms of bleeding such as spontaneous epistaxis, hematuria or hematochezia.  REVIEW OF SYSTEMS:   Constitutional: Denies fevers, chills or abnormal weight loss Eyes: Denies blurriness of vision Ears, nose, mouth, throat, and face: Denies mucositis or sore throat Respiratory: Denies cough, dyspnea or wheezes Cardiovascular: Denies palpitation, chest discomfort or lower extremity swelling Gastrointestinal:  Denies nausea, heartburn or change in bowel habits Skin: Denies abnormal skin rashes Lymphatics: Denies new lymphadenopathy or easy bruising Neurological:Denies numbness, tingling or new weaknesses Behavioral/Psych: Mood is stable, no new changes  All other systems were reviewed with the patient and are negative.  I have reviewed the past medical history, past surgical history, social history and family history with the patient and they are unchanged from previous note.  ALLERGIES:  has No Known Allergies.  MEDICATIONS:  Current Outpatient Prescriptions  Medication Sig Dispense Refill  . allopurinol (ZYLOPRIM) 300 MG tablet Take 300 mg by mouth daily.    Marland Kitchen amLODipine (NORVASC) 5 MG tablet Take 5 mg by mouth daily.    . chlorhexidine  (PERIDEX) 0.12 % solution Use as directed 15 mLs in the mouth or throat as needed.    . indomethacin (INDOCIN) 50 MG capsule Take one tablet twice daily only when needed for bad arthritis flares 90 capsule 0  . simvastatin (ZOCOR) 10 MG tablet Take 1 tablet (10 mg total) by mouth at bedtime. 90 tablet 3   No current facility-administered medications for this visit.    PHYSICAL EXAMINATION: ECOG PERFORMANCE STATUS: 2 - Symptomatic, <50% confined to bed  Filed Vitals:   06/26/15 1451  BP: 147/90  Pulse: 85  Temp: 97.6 F (36.4 C)  Resp: 21   Filed Weights   06/26/15 1451  Weight: 340 lb 14.4 oz (154.631 kg)    GENERAL:alert, no distress and comfortable. He is morbidly obese SKIN: skin color, texture, turgor are normal, no rashes or significant lesions EYES: normal, Conjunctiva are pink and non-injected, sclera clear OROPHARYNX:no exudate, no erythema and lips, buccal mucosa, and tongue normal  NECK: he had Deformity on the neck from prior surgery/radiation. LYMPH:  Palpable lymphadenopathy in the left axilla. LUNGS: clear to auscultation and percussion with normal breathing effort HEART: regular rate & rhythm and no murmurs with chronic bilateral lower extremity edema ABDOMEN:abdomen soft, non-tender and normal bowel sounds. Examination is limited by morbid obesity Musculoskeletal:no cyanosis of digits and no clubbing  NEURO: alert & oriented x 3 with fluent speech, no focal motor/sensory deficits  LABORATORY DATA:  I have reviewed the data as listed    Component Value Date/Time   NA 138 06/26/2015 1438   NA 137 03/30/2015 0645   K 4.9 06/26/2015 1438   K 4.4 03/30/2015 0645   CL 105 03/30/2015 0645   CO2 26 06/26/2015 1438   CO2 21* 03/30/2015 0645  GLUCOSE 125 06/26/2015 1438   GLUCOSE 105* 03/30/2015 0645   BUN 16.1 06/26/2015 1438   BUN 37* 03/30/2015 0645   CREATININE 1.1 06/26/2015 1438   CREATININE 1.27* 03/30/2015 0645   CREATININE 1.13 03/11/2013 1505    CALCIUM 9.1 06/26/2015 1438   CALCIUM 8.7* 03/30/2015 0645   PROT 6.8 06/26/2015 1438   PROT 6.8 03/30/2015 0645   ALBUMIN 4.1 06/26/2015 1438   ALBUMIN 4.0 03/30/2015 0645   AST 19 06/26/2015 1438   AST 34 03/30/2015 0645   ALT 15 06/26/2015 1438   ALT 25 03/30/2015 0645   ALKPHOS 101 06/26/2015 1438   ALKPHOS 79 03/30/2015 0645   BILITOT 1.00 06/26/2015 1438   BILITOT 0.8 03/30/2015 0645   GFRNONAA 56* 03/30/2015 0645   GFRAA >60 03/30/2015 0645    No results found for: SPEP, UPEP  Lab Results  Component Value Date   WBC 67.7* 06/26/2015   NEUTROABS 6.1 06/26/2015   HGB 14.3 06/26/2015   HCT 44.9 06/26/2015   MCV 87.6 06/26/2015   PLT 106* 06/26/2015      Chemistry      Component Value Date/Time   NA 138 06/26/2015 1438   NA 137 03/30/2015 0645   K 4.9 06/26/2015 1438   K 4.4 03/30/2015 0645   CL 105 03/30/2015 0645   CO2 26 06/26/2015 1438   CO2 21* 03/30/2015 0645   BUN 16.1 06/26/2015 1438   BUN 37* 03/30/2015 0645   CREATININE 1.1 06/26/2015 1438   CREATININE 1.27* 03/30/2015 0645   CREATININE 1.13 03/11/2013 1505      Component Value Date/Time   CALCIUM 9.1 06/26/2015 1438   CALCIUM 8.7* 03/30/2015 0645   ALKPHOS 101 06/26/2015 1438   ALKPHOS 79 03/30/2015 0645   AST 19 06/26/2015 1438   AST 34 03/30/2015 0645   ALT 15 06/26/2015 1438   ALT 25 03/30/2015 0645   BILITOT 1.00 06/26/2015 1438   BILITOT 0.8 03/30/2015 0645      ASSESSMENT & PLAN:  CLL (chronic lymphocytic leukemia) His lymphocyte doubling time is greater 6 months. He is not symptomatic. He has small palpable lymph nodes in his axilla. At present time, due to lack of symptoms, I elected for observation only. I will lengthen his visit back to every 12 months.    Thrombocytopenia This is likely due to disease. He is not symptomatic. Recommend observation only.      History of tongue cancer Clinically, he has no signs of recurrence. I will continue observation  only.    Essential hypertension He has significant hypertension. However, according to his family members, blood pressure home was within normal limits. He had recent syncopal episode this summer. I would not adjust his blood pressure medications now and defer to his primary care doctor for further follow-up.  We discussed the importance of preventive care and reviewed the vaccination programs. He does not have any prior allergic reactions to influenza vaccination. He declined influenza vaccination. Orders Placed This Encounter  Procedures  . Comprehensive metabolic panel    Standing Status: Future     Number of Occurrences:      Standing Expiration Date: 07/30/2016  . CBC with Differential/Platelet    Standing Status: Future     Number of Occurrences:      Standing Expiration Date: 07/30/2016  . Lactate dehydrogenase    Standing Status: Future     Number of Occurrences:      Standing Expiration Date: 07/30/2016   All questions were  answered. The patient knows to call the clinic with any problems, questions or concerns. No barriers to learning was detected. I spent 15 minutes counseling the patient face to face. The total time spent in the appointment was 20 minutes and more than 50% was on counseling and review of test results     [facility], [place], MD 06/26/2015 3:25 PM

## 2015-06-26 NOTE — Assessment & Plan Note (Signed)
His lymphocyte doubling time is greater 6 months. He is not symptomatic. He has small palpable lymph nodes in his axilla. At present time, due to lack of symptoms, I elected for observation only. I will lengthen his visit back to every 12 months.

## 2015-06-26 NOTE — Assessment & Plan Note (Signed)
He has significant hypertension. However, according to his family members, blood pressure home was within normal limits. He had recent syncopal episode this summer. I would not adjust his blood pressure medications now and defer to his primary care doctor for further follow-up.

## 2015-06-26 NOTE — Assessment & Plan Note (Signed)
Clinically, he has no signs of recurrence. I will continue observation only. 

## 2015-06-26 NOTE — Assessment & Plan Note (Signed)
This is likely due to disease. He is not symptomatic. Recommend observation only. 

## 2015-06-27 ENCOUNTER — Telehealth: Payer: Self-pay | Admitting: Hematology and Oncology

## 2015-06-27 ENCOUNTER — Other Ambulatory Visit: Payer: Self-pay | Admitting: Hematology and Oncology

## 2015-06-27 LAB — TSH CHCC: TSH: 2.569 m(IU)/L (ref 0.320–4.118)

## 2015-06-27 NOTE — Telephone Encounter (Signed)
phone would not go through...kept dropping call...mailed pt appt sched/avs and letter

## 2015-07-26 ENCOUNTER — Other Ambulatory Visit: Payer: Medicare PPO

## 2015-07-26 ENCOUNTER — Ambulatory Visit: Payer: Medicare PPO | Admitting: Hematology and Oncology

## 2015-12-11 ENCOUNTER — Other Ambulatory Visit: Payer: Self-pay | Admitting: Family Medicine

## 2015-12-11 DIAGNOSIS — F0390 Unspecified dementia without behavioral disturbance: Secondary | ICD-10-CM

## 2015-12-11 DIAGNOSIS — R32 Unspecified urinary incontinence: Secondary | ICD-10-CM

## 2015-12-11 DIAGNOSIS — R270 Ataxia, unspecified: Secondary | ICD-10-CM

## 2015-12-17 ENCOUNTER — Ambulatory Visit
Admission: RE | Admit: 2015-12-17 | Discharge: 2015-12-17 | Disposition: A | Discharge status: Home / Self Care | Payer: Medicare PPO | Source: Ambulatory Visit | Attending: Family Medicine | Admitting: Family Medicine

## 2015-12-17 DIAGNOSIS — R270 Ataxia, unspecified: Secondary | ICD-10-CM

## 2015-12-17 DIAGNOSIS — R32 Unspecified urinary incontinence: Secondary | ICD-10-CM

## 2015-12-17 DIAGNOSIS — F0390 Unspecified dementia without behavioral disturbance: Secondary | ICD-10-CM

## 2015-12-17 MED ORDER — IOPAMIDOL (ISOVUE-300) INJECTION 61%
75.0000 mL | Freq: Once | INTRAVENOUS | Status: AC | PRN
  Administered 2015-12-17: 75 mL via INTRAVENOUS

## 2015-12-20 ENCOUNTER — Telehealth: Payer: Self-pay | Admitting: Hematology and Oncology

## 2015-12-20 ENCOUNTER — Ambulatory Visit (INDEPENDENT_AMBULATORY_CARE_PROVIDER_SITE_OTHER): Payer: Medicare PPO | Admitting: Neurology

## 2015-12-20 ENCOUNTER — Encounter: Payer: Self-pay | Admitting: Neurology

## 2015-12-20 VITALS — BP 138/76 | HR 78 | Resp 20 | Ht 78.0 in | Wt 331.0 lb

## 2015-12-20 DIAGNOSIS — R296 Repeated falls: Secondary | ICD-10-CM

## 2015-12-20 DIAGNOSIS — F0390 Unspecified dementia without behavioral disturbance: Secondary | ICD-10-CM | POA: Diagnosis not present

## 2015-12-20 DIAGNOSIS — M7989 Other specified soft tissue disorders: Secondary | ICD-10-CM

## 2015-12-20 DIAGNOSIS — R0683 Snoring: Secondary | ICD-10-CM

## 2015-12-20 DIAGNOSIS — N39498 Other specified urinary incontinence: Secondary | ICD-10-CM

## 2015-12-20 DIAGNOSIS — R269 Unspecified abnormalities of gait and mobility: Secondary | ICD-10-CM

## 2015-12-20 DIAGNOSIS — R2689 Other abnormalities of gait and mobility: Secondary | ICD-10-CM

## 2015-12-20 DIAGNOSIS — R930 Abnormal findings on diagnostic imaging of skull and head, not elsewhere classified: Secondary | ICD-10-CM

## 2015-12-20 DIAGNOSIS — E669 Obesity, unspecified: Secondary | ICD-10-CM | POA: Diagnosis not present

## 2015-12-20 DIAGNOSIS — I8312 Varicose veins of left lower extremity with inflammation: Secondary | ICD-10-CM

## 2015-12-20 DIAGNOSIS — I8311 Varicose veins of right lower extremity with inflammation: Secondary | ICD-10-CM

## 2015-12-20 DIAGNOSIS — R5381 Other malaise: Secondary | ICD-10-CM

## 2015-12-20 DIAGNOSIS — I872 Venous insufficiency (chronic) (peripheral): Secondary | ICD-10-CM

## 2015-12-20 NOTE — Progress Notes (Signed)
Subjective:    Patient ID: Dale George is a 70 y.o. male.  HPI     Dale Age, MD, PhD [facility] [address] [place], [place] [address]  Dear Dr. Alyson George,   I saw your patient, Dale George, upon your kind request, in my neurologic clinic today for initial consultation of his dementia and gait disorder, concern for normal pressure hydrocephalus. The patient is accompanied by his daughter, Dale George, today. As you know, Dale George is a 69 year old right-handed gentleman with an underlying complex medical history of chronic kidney disease, gout, lower extremity swelling, hypertension, hyperlipidemia, obesity, CLL, followed by hematology, cancer of the tongue, status post surgery and radiation therapy, who has had memory loss for the past year. His history is provided primarily by his daughter. The patient is not able to provide a concise history. He feels that his memory has not been as sharp. She reports memory loss for the past 4+ months. He has fallen. He fell last year in the summer after which he was taken to the hospital. He hit his head on concrete as he was trying to get out of the car. He has since then not been driving. Of note, he lives alone. His daughter lives close by, his ex-wife checks on him as well, currently at one or the other on a daily basis. His 93 year old son lives in [place]. He is divorced and has a good relationship with his ex-wife. He quit smoking in 1988. He denies any heavy alcohol use in the past and none in the recent several months. He does not always drink enough water. His daughter and ex-wife help with groceries, cooking and cleaning. He has had urinary incontinence for months. He has not yet seen a urologist as I understand. He has had lower extremity swelling for months or years, his daughter is not sure if he has seen a vein her vascular specialist for this. He does not sleep well. He snores. He has never had a sleep  study. He has nocturia but also nocturnal urinary incontinence, usually does not wear depends. He has been walking with a 2 wheeled walker. He has not been on any memory medication. I reviewed your office note from,12/10/2015 as well as an office note from Dr. Antony George from 12/18/2015, which you kindly included. He had blood work on 12/10/2015 at your office which I reviewed: WBC was 91.3. Platelets 104, glucose 100, BUN 21, creatinine 1.21, sodium 133, potassium 4.4, otherwise CMP unremarkable. Uric acid 6.1, PSA 1.5. Lipid profile showed total cholesterol of 151, triglycerides 84, LDL 100.  You ordered a [place]. 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   Prominent chronic small vessel disease changes.   No parenchymal enhancing lesion.   Moderate global atrophy. Ventricular prominence may be related to atrophy although not able to completely exclude component of mild hydrocephalus. Appearance unchanged. In addition, I personally reviewed the images through the PACS system. I also shared some images with the patient and his daughter on the computer. He had a prior CT head and CT cervical spine without contrast on 03/29/2015 after a fall: IMPRESSION: 1. No acute intracranial abnormality. 2. Moderate generalized brain atrophy and moderate chronic microvascular ischemic changes of the white matter diffusely. 3. No cervical spine fractures identified. 4. 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 5. Multilevel degenerative disc disease, spondylosis and facet  degenerative changes resulting in mild multifactorial spinal stenosis at C3-4 and multilevel foraminal stenoses as detailed above. Of note, the patient presented to the emergency room on 03/29/2015 after a fall. He was admitted to the hospital and discharged on 03/30/2015. He had fallen at home and sustained a head  laceration. Found to have orthostatic hypotension and some of his antihypertensives were held. I reviewed the discharge summary and emergency room notes.  His Past Medical History Is Significant For: Past Medical History  Diagnosis Date  . Cancer (HCC)     tongue cancer  . Hypertension   . Hyperlipidemia   . Lymphocytosis 12/14/2013  . Fatigue 12/14/2013  . CLL (chronic lymphocytic leukemia) ([place]) 12/23/2013  . Gout   . Insomnia   . Chronic kidney disease     Stage 3  . Dementia   . Ataxia     His Past Surgical History Is Significant For: Past Surgical History  Procedure Laterality Date  . Lymph node dissection      His Family History Is Significant For: No family history on file.  His Social History Is Significant For: Social History   Social History  . Marital Status: Single    Spouse Name: N/A  . Number of Children: 2  . Years of Education: N/A   Occupational History  . Retired    Social History Main Topics  . Smoking status: Former Smoker -- 2.00 packs/day for 10 years    Quit date: 09/22/1981  . Smokeless tobacco: Never Used  . Alcohol Use: 1.2 oz/week    2 Standard drinks or equivalent per week  . Drug Use: No  . Sexual Activity: Not Asked   Other Topics Concern  . None   Social History Narrative   Occasionally drinks coffee     His Allergies Are:  No Known Allergies:   His Current Medications Are:  Outpatient Encounter Prescriptions as of 12/20/2015  Medication Sig  . allopurinol (ZYLOPRIM) 300 MG tablet Take 300 mg by mouth daily.  . simvastatin (ZOCOR) 10 MG tablet Take 1 tablet (10 mg total) by mouth at bedtime.  Marland Kitchen amLODipine (NORVASC) 5 MG tablet Take 5 mg by mouth daily. Reported on 12/20/2015  . chlorhexidine (PERIDEX) 0.12 % solution Use as directed 15 mLs in the mouth or throat as needed. Reported on 12/20/2015  . indomethacin (INDOCIN) 50 MG capsule Take one tablet twice daily only when needed for bad arthritis flares (Patient not taking:  Reported on 12/20/2015)   No facility-administered encounter medications on file as of 12/20/2015.  : Review of Systems:  Out of a complete 14 point review of systems, all are reviewed and negative with the exception of these symptoms as listed below:   Review of Systems  Neurological:       Patient's daughter states that referring doctor sent patient here to discuss results of CT scan. Patient lives alone. States he has some short term memory loss.     Objective:  Neurologic Exam  Physical Exam Physical Examination:   Filed Vitals:   12/20/15 1558  BP: 138/76  Pulse: 78  Resp: 20   General Examination: The patient is a very pleasant 70 y.o. male in no acute distress. He appears frail and deconditioned. He is obese. He is marginally groomed, walker had food residue on it. Strong urine odor.   HEENT: Normocephalic, atraumatic, pupils are equal, round and reactive to light and accommodation. Extraocular tracking is good without limitation to gaze excursion or nystagmus noted.  Normal smooth pursuit is noted. Hearing is grossly intact. Face is symmetric with normal facial animation and normal facial sensation. Speech is clear with no dysarthria noted. There is no hypophonia. There is no lip, neck/head, jaw or voice tremor. Neck is supple with full range of passive and active motion. There are no carotid bruits on auscultation. Oropharynx exam reveals: moderate mouth dryness, and sclerotic appearing soft palate. Moderate airway crowding secondary to smaller airway entry primarily. Food particles in the mouth. Marginal dental hygiene. Tongue protrudes centrally and palate elevates symmetrically. Tonsils are likely absent. No carotid bruits. He has status post neck surgery with decrease in muscle bulk on the R neck.     Chest: Clear to auscultation without wheezing, rhonchi or crackles noted.  Heart: S1+S2+0, regular and normal without murmurs, rubs or gallops noted.   Abdomen: Soft,  non-tender and non-distended with normal bowel sounds appreciated on auscultation.  Extremities: There is 2+ pitting edema in the distal lower extremities bilaterally. Pedal pulses areDifficult to palpate.   Skin: Chronic appearing discoloration in the distal lower extremities, from the midshin downwards. Otherwise, skin very dry throughout with patchy changes in the back of the legs.   Musculoskeletal: exam reveals no obvious joint deformities, tenderness or joint swelling or erythema.   Neurologically:  Mental status: The patient is awake, alert and oriented in all 4 spheres. His immediate and remote memory, attention, language skills and fund of knowledge are impaired. He is not able to provide a reliable history. Speech is scant. He does not answer and more than a few word sentences. Mood is constricted and affect is blunted.   12/20/2015: MMSE: 17/30, CDT: 2/4, AFT: 12/min.   Cranial nerves II - XII are as described above under HEENT exam. In addition: shoulder shrug is normal with equal shoulder height noted.  Motor exam: Normal to thin bulk, 4 out of 5 global strength and tone is noted. There is no drift, tremor or rebound. Reflexes are 1+ in the upper extremities and absent in the lower extremities. Fine motor skills and coordination: impaired throughout with no lateralization. Cerebellar testing: No dysmetria or intention tremor. Heel to shin is not possible for him.  Sensory exam: intact to light touch, pinprick, vibration, temperature sense in the upper  extremities and decrease to all modalities in the lower extremities up to mid calf area approximately.  Gait, station and balance: He  is unable to stand by himself. He needs to push up but also needs assistance which his daughter provided with near maximum assistance. He is unable to stand narrow based. Romberg is not testable. Tandem walk is not possible. He is very insecure standing by himself even wide-based. He walks with a 2  wheeled walker. He walks slowly and cautiously and turns and 3-4 steps. He does not have significant shuffling or magnetic gait.   Assessment and Plan:   In summary, MANCEL KINTZEL is a very pleasant 70 y.o.-year old male with an underlying complex medical history of chronic kidney disease, gout, lower extremity swelling, hypertension, hyperlipidemia, obesity, CLL, followed by hematology, cancer of the tongue, status post surgery and radiation therapy, who Presents for initial consultation of his memory loss and gait disorder, recent fall and recurrent falls, history of incontinence, concern for underlying normal pressure hydrocephalus. On examination, he appears deconditioned, frail, possibly dehydrated, has significant lower extremity swelling and discoloration, has evidence of neuropathy, and a prominent gait disorder, without telltale shuffling or magnetic appearing gait. I talked to the  patient and his daughter at length today. His situation is complicated. I'm not convinced that he actually has NPH, as his CT scan shows chronic appearing changes with exvacuodilatation of his ventricles. Nevertheless, I did talk to the patient and his daughter about evaluation for NPH which will include a large volume spinal tap to remove fluid from the brain to see if his gait and balance improved temporarily. We also talked about ultimately placing potentially of VP shunt to perpetually drain excess fluid off the brain.  More likely, I feel the patient has a multifactorial gait disorder secondary to muscular deconditioning, chronic atherosclerotic white matter changes of the brain, chronic lower extremity swelling, morbid obesity, and evidence of neuropathy. In addition, he has a history and physical exam concerning for underlying obstructive sleep apnea.  His memory score indicates moderate dementia without obvious behavioral changes. I had a very long discussion about all of this with the patient and his daughter. I  do not believe he is safe to live by himself at this time. He is advised to get a call alert button in the interim. He is advised to use his walker at all times and wear depends at night. He is advised to stay well-hydrated with water. He is advised no longer to drive. He is advised to discuss this all with his family, daughter has POA. He is furthermore advised to discuss with you evaluation with a urologist and a vascular/pain specialist.  I would like to proceed with a brain MRI without contrast and a sleep study. They are advised to think about the spinal tap and I explained the lumbar puncture procedure and large volume tap to them and also the potential placement of a VP shunt in the future. The patient is very reluctant to proceed with multiple tests and multiple evaluations. Nevertheless, I asked him to discuss this with his family and not make a decision quite yet. I have placed an order for a sleep study and MRI brain and we will keep them posted over the phone as to the results. This was an extended and complex visit. Multiple issues were discussed and addressed. I will see him back routinely in a couple months, sooner if needed.  I answered all their questions today and the patient and his daughter were in agreement.   Thank you very much for allowing me to participate in the care of this nice patient. If I can be of any further assistance to you please do not hesitate to call me at [phone].  Sincerely,   Dale Age, MD, PhD

## 2015-12-20 NOTE — Patient Instructions (Addendum)
We will do a brain scan, called MRI and call you with the test results. We will have to schedule you for this on a separate date. This test requires authorization from your insurance, and we will take care of the insurance process.  Based on your symptoms and your exam I believe you are at risk for obstructive sleep apnea or OSA, and I think we should proceed with a sleep study to determine whether you do or do not have OSA and how severe it is. If you have more than mild OSA, I want you to consider treatment with CPAP. Please remember, the risks and ramifications of moderate to severe obstructive sleep apnea or OSA are: Cardiovascular disease, including congestive heart failure, stroke, difficult to control hypertension, arrhythmias, and even type 2 diabetes has been linked to untreated OSA. Sleep apnea causes disruption of sleep and sleep deprivation in most cases, which, in turn, can cause recurrent headaches, problems with memory, mood, concentration, focus, and vigilance. Most people with untreated sleep apnea report excessive daytime sleepiness, which can affect their ability to drive. Please do not drive if you feel sleepy.   I will likely see you back after your sleep study to go over the test results and where to go from there. We will call you after your sleep study to advise about the results (most likely, you will hear from Beverlee Nims, my nurse) and to set up an appointment at the time, as necessary.    Our sleep lab administrative assistant, Arrie Aran will meet with you or call you to schedule your sleep study. If you don't hear back from her by next week please feel free to call her at [phone]. This is her direct line and please leave a message with your phone number to call back if you get the voicemail box. She will call back as soon as possible.   Please talk things over with your family: 1. spinal tap, to get excess fluid off your brain, to see if walking and balance improve. If you have  significant improvement in your walking skills, and balance, we will consider referral for a shunt placement, as discussed. 2. longterm living situation, as I do not think you are safe to live by yourself. Please, get a call alert button in the interim.   Do not drive.   Please discuss with Dr. Alyson Ingles the following: 1. referral to urologist, 2. referral to a vascular/vein specialist.   We will call you with your brain MRI results. I'm not convinced that you have normal pressure hydrocephalus. Is more likely that you have a gait disorder secondary to multiple issues including swelling of the legs, neuropathy, deconditioning, obesity, and atherosclerotic type vascular changes in your brain.

## 2015-12-20 NOTE — Telephone Encounter (Signed)
lvm and advised on April appt....pt ok and aware

## 2015-12-27 ENCOUNTER — Telehealth: Payer: Self-pay

## 2015-12-27 NOTE — Telephone Encounter (Signed)
Dr. Alyson Ingles called and would like to talk to you about this patient. cell# is [phone] or ofc [phone]

## 2016-01-03 ENCOUNTER — Telehealth: Payer: Self-pay | Admitting: Hematology and Oncology

## 2016-01-03 NOTE — Telephone Encounter (Signed)
returned call and r/s appt....pt ok and aware

## 2016-01-07 ENCOUNTER — Ambulatory Visit: Payer: Medicare PPO | Admitting: Hematology and Oncology

## 2016-01-07 ENCOUNTER — Other Ambulatory Visit: Payer: Medicare PPO

## 2016-01-28 ENCOUNTER — Other Ambulatory Visit: Payer: Medicare PPO

## 2016-01-28 ENCOUNTER — Telehealth: Payer: Self-pay | Admitting: Hematology and Oncology

## 2016-01-28 ENCOUNTER — Ambulatory Visit: Payer: Medicare PPO | Admitting: Hematology and Oncology

## 2016-01-28 NOTE — Telephone Encounter (Signed)
returned call and vm not set up.Marland KitchenMarland KitchenMarland KitchenMarland Kitchenpt stated they will call back to r/s

## 2016-02-12 ENCOUNTER — Telehealth: Payer: Self-pay | Admitting: *Deleted

## 2016-02-12 NOTE — Telephone Encounter (Signed)
Notified Dr. Noland Fordyce office pt has canceled last two appts to see Dr. Alvy Bimler.  Scheduler has left message for pt to call us to r/s but she has not called [place] back yet.

## 2016-03-05 ENCOUNTER — Emergency Department (HOSPITAL_COMMUNITY): Payer: Medicare PPO

## 2016-03-05 ENCOUNTER — Encounter (HOSPITAL_COMMUNITY): Payer: Self-pay | Admitting: Emergency Medicine

## 2016-03-05 ENCOUNTER — Inpatient Hospital Stay (HOSPITAL_COMMUNITY)
Admission: EM | Admit: 2016-03-05 | Discharge: 2016-03-14 | DRG: 176 | Disposition: A | Discharge status: Skilled Nursing Facility | Payer: Medicare PPO | Attending: Internal Medicine | Admitting: Internal Medicine

## 2016-03-05 ENCOUNTER — Inpatient Hospital Stay (HOSPITAL_COMMUNITY): Payer: Medicare PPO

## 2016-03-05 DIAGNOSIS — R296 Repeated falls: Secondary | ICD-10-CM | POA: Diagnosis present

## 2016-03-05 DIAGNOSIS — I82622 Acute embolism and thrombosis of deep veins of left upper extremity: Secondary | ICD-10-CM | POA: Diagnosis present

## 2016-03-05 DIAGNOSIS — M5030 Other cervical disc degeneration, unspecified cervical region: Secondary | ICD-10-CM | POA: Diagnosis present

## 2016-03-05 DIAGNOSIS — I2699 Other pulmonary embolism without acute cor pulmonale: Secondary | ICD-10-CM | POA: Diagnosis not present

## 2016-03-05 DIAGNOSIS — W19XXXS Unspecified fall, sequela: Secondary | ICD-10-CM | POA: Diagnosis not present

## 2016-03-05 DIAGNOSIS — M109 Gout, unspecified: Secondary | ICD-10-CM | POA: Diagnosis present

## 2016-03-05 DIAGNOSIS — Z515 Encounter for palliative care: Secondary | ICD-10-CM | POA: Diagnosis present

## 2016-03-05 DIAGNOSIS — Z85810 Personal history of malignant neoplasm of tongue: Secondary | ICD-10-CM

## 2016-03-05 DIAGNOSIS — S90811A Abrasion, right foot, initial encounter: Secondary | ICD-10-CM | POA: Diagnosis present

## 2016-03-05 DIAGNOSIS — I82B19 Acute embolism and thrombosis of unspecified subclavian vein: Secondary | ICD-10-CM | POA: Diagnosis present

## 2016-03-05 DIAGNOSIS — B372 Candidiasis of skin and nail: Secondary | ICD-10-CM | POA: Diagnosis present

## 2016-03-05 DIAGNOSIS — A419 Sepsis, unspecified organism: Secondary | ICD-10-CM

## 2016-03-05 DIAGNOSIS — R000 Tachycardia, unspecified: Secondary | ICD-10-CM | POA: Diagnosis not present

## 2016-03-05 DIAGNOSIS — M4802 Spinal stenosis, cervical region: Secondary | ICD-10-CM | POA: Diagnosis present

## 2016-03-05 DIAGNOSIS — K921 Melena: Secondary | ICD-10-CM | POA: Diagnosis present

## 2016-03-05 DIAGNOSIS — S90812A Abrasion, left foot, initial encounter: Secondary | ICD-10-CM | POA: Diagnosis present

## 2016-03-05 DIAGNOSIS — R609 Edema, unspecified: Secondary | ICD-10-CM

## 2016-03-05 DIAGNOSIS — I82413 Acute embolism and thrombosis of femoral vein, bilateral: Secondary | ICD-10-CM | POA: Diagnosis present

## 2016-03-05 DIAGNOSIS — I517 Cardiomegaly: Secondary | ICD-10-CM | POA: Diagnosis present

## 2016-03-05 DIAGNOSIS — Z86718 Personal history of other venous thrombosis and embolism: Secondary | ICD-10-CM | POA: Diagnosis not present

## 2016-03-05 DIAGNOSIS — E860 Dehydration: Secondary | ICD-10-CM | POA: Diagnosis present

## 2016-03-05 DIAGNOSIS — M6282 Rhabdomyolysis: Secondary | ICD-10-CM | POA: Diagnosis present

## 2016-03-05 DIAGNOSIS — N183 Chronic kidney disease, stage 3 unspecified: Secondary | ICD-10-CM | POA: Diagnosis present

## 2016-03-05 DIAGNOSIS — N179 Acute kidney failure, unspecified: Secondary | ICD-10-CM

## 2016-03-05 DIAGNOSIS — R1310 Dysphagia, unspecified: Secondary | ICD-10-CM | POA: Diagnosis not present

## 2016-03-05 DIAGNOSIS — Z79899 Other long term (current) drug therapy: Secondary | ICD-10-CM | POA: Diagnosis not present

## 2016-03-05 DIAGNOSIS — I340 Nonrheumatic mitral (valve) insufficiency: Secondary | ICD-10-CM | POA: Diagnosis not present

## 2016-03-05 DIAGNOSIS — I10 Essential (primary) hypertension: Secondary | ICD-10-CM | POA: Diagnosis present

## 2016-03-05 DIAGNOSIS — I129 Hypertensive chronic kidney disease with stage 1 through stage 4 chronic kidney disease, or unspecified chronic kidney disease: Secondary | ICD-10-CM | POA: Diagnosis present

## 2016-03-05 DIAGNOSIS — Z7901 Long term (current) use of anticoagulants: Secondary | ICD-10-CM

## 2016-03-05 DIAGNOSIS — I444 Left anterior fascicular block: Secondary | ICD-10-CM | POA: Diagnosis present

## 2016-03-05 DIAGNOSIS — E8770 Fluid overload, unspecified: Secondary | ICD-10-CM | POA: Diagnosis present

## 2016-03-05 DIAGNOSIS — I240 Acute coronary thrombosis not resulting in myocardial infarction: Secondary | ICD-10-CM | POA: Diagnosis present

## 2016-03-05 DIAGNOSIS — C9110 Chronic lymphocytic leukemia of B-cell type not having achieved remission: Secondary | ICD-10-CM | POA: Diagnosis present

## 2016-03-05 DIAGNOSIS — I672 Cerebral atherosclerosis: Secondary | ICD-10-CM | POA: Diagnosis present

## 2016-03-05 DIAGNOSIS — J690 Pneumonitis due to inhalation of food and vomit: Secondary | ICD-10-CM | POA: Insufficient documentation

## 2016-03-05 DIAGNOSIS — E875 Hyperkalemia: Secondary | ICD-10-CM | POA: Diagnosis present

## 2016-03-05 DIAGNOSIS — B9789 Other viral agents as the cause of diseases classified elsewhere: Secondary | ICD-10-CM | POA: Diagnosis present

## 2016-03-05 DIAGNOSIS — B3789 Other sites of candidiasis: Secondary | ICD-10-CM

## 2016-03-05 DIAGNOSIS — W19XXXA Unspecified fall, initial encounter: Secondary | ICD-10-CM | POA: Diagnosis present

## 2016-03-05 DIAGNOSIS — K219 Gastro-esophageal reflux disease without esophagitis: Secondary | ICD-10-CM | POA: Diagnosis present

## 2016-03-05 DIAGNOSIS — B348 Other viral infections of unspecified site: Secondary | ICD-10-CM | POA: Diagnosis present

## 2016-03-05 DIAGNOSIS — J218 Acute bronchiolitis due to other specified organisms: Secondary | ICD-10-CM | POA: Diagnosis present

## 2016-03-05 DIAGNOSIS — R6510 Systemic inflammatory response syndrome (SIRS) of non-infectious origin without acute organ dysfunction: Secondary | ICD-10-CM | POA: Diagnosis present

## 2016-03-05 DIAGNOSIS — F0390 Unspecified dementia without behavioral disturbance: Secondary | ICD-10-CM | POA: Diagnosis present

## 2016-03-05 DIAGNOSIS — Z66 Do not resuscitate: Secondary | ICD-10-CM | POA: Diagnosis present

## 2016-03-05 DIAGNOSIS — M7989 Other specified soft tissue disorders: Secondary | ICD-10-CM | POA: Diagnosis present

## 2016-03-05 DIAGNOSIS — L89312 Pressure ulcer of right buttock, stage 2: Secondary | ICD-10-CM | POA: Diagnosis present

## 2016-03-05 DIAGNOSIS — E785 Hyperlipidemia, unspecified: Secondary | ICD-10-CM | POA: Diagnosis present

## 2016-03-05 DIAGNOSIS — L89322 Pressure ulcer of left buttock, stage 2: Secondary | ICD-10-CM | POA: Diagnosis present

## 2016-03-05 DIAGNOSIS — M4312 Spondylolisthesis, cervical region: Secondary | ICD-10-CM | POA: Diagnosis present

## 2016-03-05 DIAGNOSIS — D696 Thrombocytopenia, unspecified: Secondary | ICD-10-CM | POA: Diagnosis present

## 2016-03-05 DIAGNOSIS — T796XXD Traumatic ischemia of muscle, subsequent encounter: Secondary | ICD-10-CM | POA: Diagnosis not present

## 2016-03-05 DIAGNOSIS — Z87891 Personal history of nicotine dependence: Secondary | ICD-10-CM | POA: Diagnosis not present

## 2016-03-05 DIAGNOSIS — T796XXA Traumatic ischemia of muscle, initial encounter: Secondary | ICD-10-CM | POA: Diagnosis not present

## 2016-03-05 DIAGNOSIS — R270 Ataxia, unspecified: Secondary | ICD-10-CM | POA: Diagnosis present

## 2016-03-05 DIAGNOSIS — R319 Hematuria, unspecified: Secondary | ICD-10-CM | POA: Diagnosis present

## 2016-03-05 DIAGNOSIS — M79602 Pain in left arm: Secondary | ICD-10-CM | POA: Diagnosis present

## 2016-03-05 DIAGNOSIS — L8990 Pressure ulcer of unspecified site, unspecified stage: Secondary | ICD-10-CM | POA: Diagnosis present

## 2016-03-05 DIAGNOSIS — K625 Hemorrhage of anus and rectum: Secondary | ICD-10-CM | POA: Diagnosis present

## 2016-03-05 DIAGNOSIS — R0602 Shortness of breath: Secondary | ICD-10-CM

## 2016-03-05 LAB — COMPREHENSIVE METABOLIC PANEL
ALBUMIN: 3.7 g/dL (ref 3.5–5.0)
ALK PHOS: 93 U/L (ref 38–126)
ALT: 55 U/L (ref 17–63)
ALT: 53 U/L (ref 17–63)
ANION GAP: 9 (ref 5–15)
AST: 95 U/L — ABNORMAL HIGH (ref 15–41)
AST: 96 U/L — AB (ref 15–41)
Albumin: 3.8 g/dL (ref 3.5–5.0)
Alkaline Phosphatase: 95 U/L (ref 38–126)
Anion gap: 8 (ref 5–15)
BUN: 19 mg/dL (ref 6–20)
BUN: 21 mg/dL — ABNORMAL HIGH (ref 6–20)
CALCIUM: 8.7 mg/dL — AB (ref 8.9–10.3)
CHLORIDE: 102 mmol/L (ref 101–111)
CHLORIDE: 104 mmol/L (ref 101–111)
CO2: 21 mmol/L — ABNORMAL LOW (ref 22–32)
CO2: 20 mmol/L — AB (ref 22–32)
CREATININE: 1.33 mg/dL — AB (ref 0.61–1.24)
CREATININE: 1.38 mg/dL — AB (ref 0.61–1.24)
Calcium: 8.7 mg/dL — ABNORMAL LOW (ref 8.9–10.3)
GFR calc Af Amer: 58 mL/min — ABNORMAL LOW (ref >60)
GFR calc non Af Amer: 50 mL/min — ABNORMAL LOW (ref >60)
GFR, EST NON AFRICAN AMERICAN: 53 mL/min — AB (ref >60)
GLUCOSE: 122 mg/dL — AB (ref 65–99)
Glucose, Bld: 136 mg/dL — ABNORMAL HIGH (ref 65–99)
Potassium: 7.4 mmol/L (ref 3.5–5.1)
SODIUM: 132 mmol/L — AB (ref 135–145)
SODIUM: 132 mmol/L — AB (ref 135–145)
TOTAL PROTEIN: 7.8 g/dL (ref 6.5–8.1)
Total Bilirubin: 1.7 mg/dL — ABNORMAL HIGH (ref 0.3–1.2)
Total Bilirubin: 1.8 mg/dL — ABNORMAL HIGH (ref 0.3–1.2)
Total Protein: 6.7 g/dL (ref 6.5–8.1)

## 2016-03-05 LAB — I-STAT CG4 LACTIC ACID, ED: Lactic Acid, Venous: 2.32 mmol/L (ref 0.5–2.0)

## 2016-03-05 LAB — TYPE AND SCREEN
ABO/RH(D): O POS
ANTIBODY SCREEN: NEGATIVE

## 2016-03-05 LAB — URINALYSIS, ROUTINE W REFLEX MICROSCOPIC
GLUCOSE, UA: NEGATIVE mg/dL
Ketones, ur: 15 mg/dL — AB
Leukocytes, UA: NEGATIVE
Nitrite: NEGATIVE
PH: 5 (ref 5.0–8.0)
Protein, ur: 30 mg/dL — AB
SPECIFIC GRAVITY, URINE: 1.033 — AB (ref 1.005–1.030)

## 2016-03-05 LAB — CBC WITH DIFFERENTIAL/PLATELET
Band Neutrophils: 0 %
Basophils Absolute: 0 10*3/uL (ref 0.0–0.1)
Basophils Relative: 0 %
Blasts: 0 %
EOS PCT: 0 %
Eosinophils Absolute: 0 10*3/uL (ref 0.0–0.7)
HCT: 49 % (ref 39.0–52.0)
HEMOGLOBIN: 15.8 g/dL (ref 13.0–17.0)
Lymphocytes Relative: 89 %
Lymphs Abs: 117.7 10*3/uL — ABNORMAL HIGH (ref 0.7–4.0)
MCH: 26.6 pg (ref 26.0–34.0)
MCHC: 32.2 g/dL (ref 30.0–36.0)
MCV: 82.6 fL (ref 78.0–100.0)
METAMYELOCYTES PCT: 0 %
MONO ABS: 2.6 10*3/uL — AB (ref 0.1–1.0)
MYELOCYTES: 0 %
Monocytes Relative: 2 %
Neutro Abs: 11.9 10*3/uL — ABNORMAL HIGH (ref 1.7–7.7)
Neutrophils Relative %: 9 %
Platelets: 101 10*3/uL — ABNORMAL LOW (ref 150–400)
Promyelocytes Absolute: 0 %
RBC: 5.93 MIL/uL — AB (ref 4.22–5.81)
RDW: 15.1 % (ref 11.5–15.5)
WBC: 132.2 10*3/uL — AB (ref 4.0–10.5)
nRBC: 0 /100 WBC

## 2016-03-05 LAB — I-STAT CHEM 8, ED
BUN: 32 mg/dL — AB (ref 6–20)
CALCIUM ION: 0.85 mmol/L — AB (ref 1.13–1.30)
CREATININE: 1.1 mg/dL (ref 0.61–1.24)
Chloride: 108 mmol/L (ref 101–111)
GLUCOSE: 114 mg/dL — AB (ref 65–99)
HCT: 51 % (ref 39.0–52.0)
Hemoglobin: 17.3 g/dL — ABNORMAL HIGH (ref 13.0–17.0)
POTASSIUM: 4.7 mmol/L (ref 3.5–5.1)
Sodium: 136 mmol/L (ref 135–145)
TCO2: 21 mmol/L (ref 0–100)

## 2016-03-05 LAB — URINE MICROSCOPIC-ADD ON

## 2016-03-05 LAB — CK: Total CK: 2524 U/L — ABNORMAL HIGH (ref 49–397)

## 2016-03-05 LAB — I-STAT TROPONIN, ED
TROPONIN I, POC: 0.02 ng/mL (ref 0.00–0.08)
Troponin i, poc: 0.02 ng/mL (ref 0.00–0.08)

## 2016-03-05 LAB — ABO/RH: ABO/RH(D): O POS

## 2016-03-05 MED ORDER — NYSTATIN 100000 UNIT/GM EX POWD
Freq: Once | CUTANEOUS | Status: AC
  Administered 2016-03-05: 20:00:00 via TOPICAL
  Filled 2016-03-05: qty 15

## 2016-03-05 MED ORDER — SODIUM CHLORIDE 0.9 % IV BOLUS (SEPSIS)
1000.0000 mL | Freq: Once | INTRAVENOUS | Status: AC
Start: 2016-03-05 — End: 2016-03-05
  Administered 2016-03-05: 1000 mL via INTRAVENOUS

## 2016-03-05 MED ORDER — VANCOMYCIN HCL 10 G IV SOLR
1250.0000 mg | Freq: Two times a day (BID) | INTRAVENOUS | Status: DC
  Filled 2016-03-05: qty 1250

## 2016-03-05 MED ORDER — SODIUM CHLORIDE 0.9 % IV SOLN
Freq: Once | INTRAVENOUS | Status: AC
  Administered 2016-03-06: 01:00:00 via INTRAVENOUS

## 2016-03-05 MED ORDER — ONDANSETRON HCL 4 MG PO TABS: 4.0000 mg | ORAL_TABLET | Freq: Four times a day (QID) | ORAL | Status: DC | PRN

## 2016-03-05 MED ORDER — PIPERACILLIN-TAZOBACTAM 3.375 G IVPB 30 MIN
3.3750 g | INTRAVENOUS | Status: AC
  Administered 2016-03-06: 3.375 g via INTRAVENOUS
  Filled 2016-03-05: qty 50

## 2016-03-05 MED ORDER — SODIUM CHLORIDE 0.9 % IV SOLN
Freq: Once | INTRAVENOUS | Status: AC
  Administered 2016-03-05: 22:00:00 via INTRAVENOUS

## 2016-03-05 MED ORDER — SODIUM CHLORIDE 0.9% FLUSH
3.0000 mL | Freq: Two times a day (BID) | INTRAVENOUS | Status: DC
  Administered 2016-03-06 – 2016-03-14 (×12): 3 mL via INTRAVENOUS

## 2016-03-05 MED ORDER — VANCOMYCIN HCL 10 G IV SOLR
2500.0000 mg | Freq: Once | INTRAVENOUS | Status: AC
  Administered 2016-03-06: 2500 mg via INTRAVENOUS
  Filled 2016-03-05: qty 2500

## 2016-03-05 MED ORDER — MORPHINE SULFATE (PF) 2 MG/ML IV SOLN
2.0000 mg | INTRAVENOUS | Status: DC | PRN
  Administered 2016-03-09 – 2016-03-10 (×3): 2 mg via INTRAVENOUS
  Filled 2016-03-05 (×3): qty 1

## 2016-03-05 MED ORDER — ACETAMINOPHEN 650 MG RE SUPP: 650.0000 mg | Freq: Four times a day (QID) | RECTAL | Status: DC | PRN

## 2016-03-05 MED ORDER — SODIUM CHLORIDE 0.9 % IV BOLUS (SEPSIS)
1000.0000 mL | Freq: Once | INTRAVENOUS | Status: AC
  Administered 2016-03-05: 1000 mL via INTRAVENOUS

## 2016-03-05 MED ORDER — ONDANSETRON HCL 4 MG/2ML IJ SOLN: 4.0000 mg | Freq: Four times a day (QID) | INTRAMUSCULAR | Status: DC | PRN

## 2016-03-05 MED ORDER — ACETAMINOPHEN 325 MG PO TABS: 650.0000 mg | ORAL_TABLET | Freq: Four times a day (QID) | ORAL | Status: DC | PRN

## 2016-03-05 MED ORDER — SODIUM CHLORIDE 0.9 % IV BOLUS (SEPSIS)
1000.0000 mL | Freq: Once | INTRAVENOUS | Status: AC
  Administered 2016-03-06: 1000 mL via INTRAVENOUS

## 2016-03-05 MED ORDER — SODIUM CHLORIDE 0.9 % IV SOLN
Freq: Once | INTRAVENOUS | Status: DC
Start: 2016-03-05 — End: 2016-03-05

## 2016-03-05 MED ORDER — VANCOMYCIN HCL 10 G IV SOLR
2500.0000 mg | Freq: Two times a day (BID) | INTRAVENOUS | Status: DC
  Filled 2016-03-05: qty 2500

## 2016-03-05 MED ORDER — PIPERACILLIN-TAZOBACTAM 3.375 G IVPB
3.3750 g | Freq: Three times a day (TID) | INTRAVENOUS | Status: DC
  Administered 2016-03-06: 3.375 g via INTRAVENOUS
  Filled 2016-03-05 (×3): qty 50

## 2016-03-05 MED ORDER — OXYCODONE-ACETAMINOPHEN 5-325 MG PO TABS
2.0000 | ORAL_TABLET | Freq: Four times a day (QID) | ORAL | Status: DC | PRN
  Administered 2016-03-07: 2 via ORAL
  Filled 2016-03-05: qty 2

## 2016-03-05 NOTE — ED Notes (Signed)
Pt attempted to give urine sample, unable to go at this time

## 2016-03-05 NOTE — ED Notes (Signed)
Patient transported to CT 

## 2016-03-05 NOTE — ED Notes (Signed)
Pt informed of the need of a urine sample. Pt stated that he is not able to provide at present moment. Will try back later.

## 2016-03-05 NOTE — ED Notes (Signed)
Patient transported to X-ray 

## 2016-03-05 NOTE — ED Notes (Addendum)
Per EMS: pt from home c/o laying on floor since midnight with approx 200 cc BRB from rectum noted by EMS; 18g L FA; pt fell last night at 1800 and EMS helped back to bed; pt noted to have dried blood on him and stool

## 2016-03-05 NOTE — ED Provider Notes (Addendum)
CSN: [id number]     Arrival date & time 03/05/16  1814 History   First MD Initiated Contact with Patient 03/05/16 1815     Chief Complaint  Patient presents with  . Rectal Bleeding     (Consider location/radiation/quality/duration/timing/severity/associated sxs/prior Treatment) HPI Comments: 70 year old male with past medical history including tongue cancer, CLL, hypertension, hyperlipidemia, CKD, dementia who presents with fall and rectal bleeding. The patient reports that around 6 PM last night, he fell when trying to get out of bed. He called EMS and they assisted him to standing. He did not want transport at that time. Around midnight, he got up again and had another fall. He remained on the floor on his left side, unable to get up. He was found this afternoon with approximately 265ml bright red blood per rectum noted by EMS. He denies any recent history of GI bleeding. He reports a chronic mild cough with phlegm. He denies any urinary symptoms. He denies any hip pain. He does report hitting his head but did not lose consciousness.  LEVEL 5 CAVEAT DUE TO DEMENTIA  Patient is a 70 y.o. male presenting with hematochezia. The history is provided by the patient.  Rectal Bleeding   Past Medical History  Diagnosis Date  . Cancer (HCC)     tongue cancer  . Hypertension   . Hyperlipidemia   . Lymphocytosis 12/14/2013  . Fatigue 12/14/2013  . CLL (chronic lymphocytic leukemia) ([place]) 12/23/2013  . Gout   . Insomnia   . Chronic kidney disease     Stage 3  . Dementia   . Ataxia    Past Surgical History  Procedure Laterality Date  . Lymph node dissection     History reviewed. No pertinent family history. Social History  Substance Use Topics  . Smoking status: Former Smoker -- 2.00 packs/day for 10 years    Quit date: 09/22/1981  . Smokeless tobacco: Never Used  . Alcohol Use: 1.2 oz/week    2 Standard drinks or equivalent per week    Review of Systems  Unable to perform ROS:  Dementia  Gastrointestinal: Positive for hematochezia.      Allergies  Review of patient's allergies indicates no known allergies.  Home Medications   Prior to Admission medications   Medication Sig Start Date End Date Taking? Authorizing Provider  allopurinol (ZYLOPRIM) 300 MG tablet Take 300 mg by mouth daily. 11/28/13   Historical Provider, MD  amLODipine (NORVASC) 5 MG tablet Take 5 mg by mouth daily. Reported on 12/20/2015 11/28/13   Historical Provider, MD  chlorhexidine (PERIDEX) 0.12 % solution Use as directed 15 mLs in the mouth or throat as needed. Reported on 12/20/2015    Historical Provider, MD  indomethacin (INDOCIN) 50 MG capsule Take one tablet twice daily only when needed for bad arthritis flares Patient not taking: Reported on 12/20/2015 03/11/13   Posey Boyer, MD  simvastatin (ZOCOR) 10 MG tablet Take 1 tablet (10 mg total) by mouth at bedtime. 03/11/13   Posey Boyer, MD   BP 114/79 mmHg  Pulse 120  Temp(Src) 99.8 F (37.7 C) (Rectal)  Resp 30  SpO2 97% Physical Exam  Constitutional: He appears well-developed and well-nourished. No distress.  Ill-appearing but awake and alert  HENT:  Head: Normocephalic and atraumatic.  dry mucous membranes  Eyes: Pupils are equal, round, and reactive to light. Right eye exhibits discharge. Left eye exhibits discharge.  Neck: Neck supple.  Cardiovascular: Normal heart sounds.  An irregularly irregular rhythm  present. Tachycardia present.   No murmur heard. Pulmonary/Chest: Breath sounds normal. No respiratory distress.  Tachypnea w/ mildly increased WOB  Abdominal: Soft. Bowel sounds are normal. He exhibits no distension. There is no tenderness.  Musculoskeletal: He exhibits no edema.  Neurological: He is alert.  Oriented to person and place, mildly confused but able to answer questions, moving all 4 extremities  Skin: Skin is warm.  Extensive candidal skin infection under pannus, in groin and involving scrotum w/ small scab  on scrotum; bullae and erythema on L side, L lateral knee, R medial knee, R great toe, L 2nd toe  Nursing note and vitals reviewed.   ED Course  .Critical Care Performed by: Sharlett Iles Authorized by: Sharlett Iles Total critical care time: 45 minutes Critical care was necessary to treat or prevent imminent or life-threatening deterioration of the following conditions: dehydration. Critical care was time spent personally by me on the following activities: development of treatment plan with patient or surrogate, evaluation of patient's response to treatment, examination of patient, obtaining history from patient or surrogate, ordering and performing treatments and interventions, ordering and review of laboratory studies, ordering and review of radiographic studies, re-evaluation of patient's condition and review of old charts.   (including critical care time) Labs Review Labs Reviewed  COMPREHENSIVE METABOLIC PANEL - Abnormal; Notable for the following:    Sodium 132 (*)    Potassium >7.5 (*)    CO2 21 (*)    Glucose, Bld 136 (*)    Creatinine, Ser 1.33 (*)    Calcium 8.7 (*)    AST 95 (*)    Total Bilirubin 1.7 (*)    GFR calc non Af Amer 53 (*)    All other components within normal limits  CBC WITH DIFFERENTIAL/PLATELET - Abnormal; Notable for the following:    WBC 132.2 (*)    RBC 5.93 (*)    Platelets 101 (*)    Neutro Abs 11.9 (*)    Lymphs Abs 117.7 (*)    Monocytes Absolute 2.6 (*)    All other components within normal limits  URINALYSIS, ROUTINE W REFLEX MICROSCOPIC (NOT AT [facility]) - Abnormal; Notable for the following:    Color, Urine ORANGE (*)    Specific Gravity, Urine 1.033 (*)    Hgb urine dipstick SMALL (*)    Bilirubin Urine SMALL (*)    Ketones, ur 15 (*)    Protein, ur 30 (*)    All other components within normal limits  CK - Abnormal; Notable for the following:    Total CK 2524 (*)    All other components within normal limits   COMPREHENSIVE METABOLIC PANEL - Abnormal; Notable for the following:    Sodium 132 (*)    Potassium 7.4 (*)    CO2 20 (*)    Glucose, Bld 122 (*)    BUN 21 (*)    Creatinine, Ser 1.38 (*)    Calcium 8.7 (*)    AST 96 (*)    Total Bilirubin 1.8 (*)    GFR calc non Af Amer 50 (*)    GFR calc Af Amer 58 (*)    All other components within normal limits  PROTIME-INR - Abnormal; Notable for the following:    Prothrombin Time 17.4 (*)    All other components within normal limits  CBC - Abnormal; Notable for the following:    WBC 127.4 (*)    Platelets 104 (*)    All other components within  normal limits  URINE MICROSCOPIC-ADD ON - Abnormal; Notable for the following:    Squamous Epithelial / LPF 0-5 (*)    Bacteria, UA RARE (*)    Casts HYALINE CASTS (*)    All other components within normal limits  I-STAT CG4 LACTIC ACID, ED - Abnormal; Notable for the following:    Lactic Acid, Venous 2.32 (*)    All other components within normal limits  I-STAT CHEM 8, ED - Abnormal; Notable for the following:    BUN 32 (*)    Glucose, Bld 114 (*)    Calcium, Ion 0.85 (*)    Hemoglobin 17.3 (*)    All other components within normal limits  CULTURE, BLOOD (ROUTINE X 2)  URINE CULTURE  CULTURE, BLOOD (ROUTINE X 2) W REFLEX TO ID PANEL  APTT  LIPID PANEL  OCCULT BLOOD X 1 CARD TO LAB, STOOL  PHOSPHORUS  URIC ACID  LACTIC ACID, PLASMA  LACTIC ACID, PLASMA  PROCALCITONIN  BASIC METABOLIC PANEL  CK  I-STAT TROPOININ, ED  I-STAT TROPOININ, ED  TYPE AND SCREEN  ABO/RH    Imaging Review Dg Chest 2 View  03/05/2016  CLINICAL DATA:  Found lying on the floor with bright red bleeding from rectum. Fell yesterday. EXAM: CHEST  2 VIEW COMPARISON:  03/29/2015 FINDINGS: The lungs are clear without airspace disease or pulmonary edema. Heart and mediastinum are within normal limits. Negative for a pneumothorax. Lateral view is limited because the arms are not elevated. No large pleural effusions.  Bony thorax is grossly intact. Degenerative changes at the right [facility] joint. IMPRESSION: No acute chest abnormality. Electronically Signed   By: Markus Daft M.D.   On: 03/05/2016 19:57   Ct Head Wo Contrast  03/05/2016  CLINICAL DATA:  Unwitnessed fall. EXAM: CT HEAD WITHOUT CONTRAST CT CERVICAL SPINE WITHOUT CONTRAST TECHNIQUE: Multidetector CT imaging of the head and cervical spine was performed following the standard protocol without intravenous contrast. Multiplanar CT image reconstructions of the cervical spine were also generated. COMPARISON:  03/29/2015 head and cervical spine CT. FINDINGS: CT HEAD FINDINGS No evidence of parenchymal hemorrhage or extra-axial fluid collection. No mass lesion, mass effect, or midline shift. No CT evidence of acute infarction. Mild intracranial atherosclerosis. Nonspecific moderate subcortical and periventricular white matter hypodensity, most in keeping with chronic small vessel ischemic change. Generalized cerebral volume loss. Cerebral ventricle sizes are stable and concordant with the degree of cerebral volume loss. Mucoperiosteal thickening throughout the visualized left maxillary sinus with associated small fluid level. Mucoperiosteal thickening throughout the dependent frontal sinus, sphenoid sinus and bilateral ethmoidal air cells. Small right greater than left mastoid effusions. No evidence of calvarial fracture. CT CERVICAL SPINE FINDINGS No fracture is detected in the cervical spine. 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 No prevertebral soft tissue swelling. Normal cervical lordosis. Dens is well positioned between the lateral masses of C1. No acute facet subluxation. Marked degenerative disc disease throughout the cervical spine, most prominent at C6-7 with bulky anterior marginal osteophytes at C3-4. Posterior disc bulge at C3-4 effaces the anterior thecal sac with no definite  cord flattening, unchanged. Severe bilateral facet arthropathy. Moderate to severe foraminal stenosis on the left at C3-4. Severe bilateral foraminal stenosis at C4-5. Stable 4 mm anterolisthesis at C6-7. No acute cervical spine subluxation. Small bilateral inferior mastoid effusions. No evidence of intra-axial hemorrhage in the visualized brain. No gross cervical canal hematoma. No significant pulmonary nodules at  the visualized lung apices. No cervical adenopathy or other significant neck soft tissue abnormality. IMPRESSION: 1. No evidence of acute intracranial abnormality. No evidence of calvarial fracture. 2. Generalized cerebral volume loss and moderate chronic small vessel ischemia. 3. No cervical spine fracture or acute malalignment. 4. 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 Stable 4 mm anterolisthesis at C6-7. 5. Severe degenerative changes in the cervical spine as described. 6. Paranasal sinusitis, possibly acute. 7. Small bilateral mastoid effusions. Electronically Signed   By: Ilona Sorrel M.D.   On: 03/05/2016 20:47   Ct Cervical Spine Wo Contrast  03/05/2016  CLINICAL DATA:  Unwitnessed fall. EXAM: CT HEAD WITHOUT CONTRAST CT CERVICAL SPINE WITHOUT CONTRAST TECHNIQUE: Multidetector CT imaging of the head and cervical spine was performed following the standard protocol without intravenous contrast. Multiplanar CT image reconstructions of the cervical spine were also generated. COMPARISON:  03/29/2015 head and cervical spine CT. FINDINGS: CT HEAD FINDINGS No evidence of parenchymal hemorrhage or extra-axial fluid collection. No mass lesion, mass effect, or midline shift. No CT evidence of acute infarction. Mild intracranial atherosclerosis. Nonspecific moderate subcortical and periventricular white matter hypodensity, most in keeping with chronic small vessel ischemic change. Generalized cerebral volume loss. Cerebral ventricle sizes are stable and concordant with the  degree of cerebral volume loss. Mucoperiosteal thickening throughout the visualized left maxillary sinus with associated small fluid level. Mucoperiosteal thickening throughout the dependent frontal sinus, sphenoid sinus and bilateral ethmoidal air cells. Small right greater than left mastoid effusions. No evidence of calvarial fracture. CT CERVICAL SPINE FINDINGS No fracture is detected in the cervical spine. 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 No prevertebral soft tissue swelling. Normal cervical lordosis. Dens is well positioned between the lateral masses of C1. No acute facet subluxation. Marked degenerative disc disease throughout the cervical spine, most prominent at C6-7 with bulky anterior marginal osteophytes at C3-4. Posterior disc bulge at C3-4 effaces the anterior thecal sac with no definite cord flattening, unchanged. Severe bilateral facet arthropathy. Moderate to severe foraminal stenosis on the left at C3-4. Severe bilateral foraminal stenosis at C4-5. Stable 4 mm anterolisthesis at C6-7. No acute cervical spine subluxation. Small bilateral inferior mastoid effusions. No evidence of intra-axial hemorrhage in the visualized brain. No gross cervical canal hematoma. No significant pulmonary nodules at the visualized lung apices. No cervical adenopathy or other significant neck soft tissue abnormality. IMPRESSION: 1. No evidence of acute intracranial abnormality. No evidence of calvarial fracture. 2. Generalized cerebral volume loss and moderate chronic small vessel ischemia. 3. No cervical spine fracture or acute malalignment. 4. 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 Stable 4 mm anterolisthesis at C6-7. 5. Severe degenerative changes in the cervical spine as described. 6. Paranasal sinusitis, possibly acute. 7. Small bilateral mastoid effusions. Electronically Signed   By: Ilona Sorrel M.D.   On: 03/05/2016 20:47   Dg Humerus Left  03/05/2016  CLINICAL DATA:  [place] yesterday.  Posterior elbow pain. EXAM: LEFT HUMERUS - 2+ VIEW COMPARISON:  None. FINDINGS: The left humerus appears intact. No evidence of acute fracture or dislocation. No focal bone lesion or bone destruction. Soft tissues are unremarkable. Note that the elbow is not optimally evaluated on humeral films. If there is suspicion of elbow injury, elbow series should be obtained. IMPRESSION: Negative. Electronically Signed   By: Lucienne Capers M.D.   On: 03/05/2016 23:58   I have personally reviewed and  evaluated these  lab results as part of my medical decision-making.   EKG Interpretation   Date/Time:  Wednesday March 05 2016 18:26:02 EDT Ventricular Rate:  127 PR Interval:    QRS Duration: 106 QT Interval:  343 QTC Calculation: 499 R Axis:   -64 Text Interpretation:  Atrial fibrillation Left anterior fascicular block  Consider anterior infarct Minimal ST depression, lateral leads A fib with  RVR new from previous T wave inversion aVL, flattening in I Confirmed by  LITTLE MD, RACHEL [phone]) on 03/05/2016 7:03:26 PM       MDM   Final diagnoses:  Non-traumatic rhabdomyolysis  Acute kidney injury (HCC)  Candida rash of groin  Left arm pain   Patient presents over 12 hours after a fall which occurred last night and patient laid on floor since midnight until discovery this afternoon. On arrival, the patient was ill-appearing but awake and alert, comfortable. Vital signs notable for tachycardia, BP normal. Rectal temperature was 99.8, repeat oral temperature several hours later was 98. He had several areas of redness and blistering on his skin from pressure wounds and extensive candidal infection in his groin. Ordered multiple IV fluid boluses, nystatin powder, and obtained above lab work including CK and lactate. Lactate mildly elevated at 2.3, troponin normal, CK 2524. CBC notable for WBC 132,000,  platelets 101,000. Patient does have history of CLL and has had significantly elevated WBC count previously, however this is higher than his baseline. CMP shows creatinine of 1.33, sodium 132, initial potassium elevated, however nonhemolyzed sample shows normal potassium. AST mildly elevated at 95, total bilirubin 1.7. CT of head and C-spine negative for acute injury. Chest x-ray unremarkable. Although initial EKG showed irregularly irregular rhythm suggestive of atrial fibrillation with RVR, repeat EKG showed sinus tachycardia. I discussed admission with [facility] hospitalist, Dr. Blaine Hamper, and pt admitted for treatment of rhabdomyolysis and w/u of tachycardia.    Sharlett Iles, MD 03/06/16 [place], MD 03/06/16 [phone]

## 2016-03-05 NOTE — Progress Notes (Signed)
Pharmacy Antibiotic Note  Dale George is a 70 y.o. male admitted on 03/05/2016 with sepsis.  Pharmacy has been consulted for Vancomycin and Zosyn dosing.  Plan: Zosyn 3.375gm IV now over 30 min then 3.375gm IV q8h - subsequent doses over 4 hours Vancomycin 2500mg  IV now then 1250mg  IV q12h Will f/u micro data, renal function, and pt's clinical condition Vanc trough at Css in obese pt    Temp (24hrs), Avg:99.8 F (37.7 C), Min:99.8 F (37.7 C), Max:99.8 F (37.7 C)   Recent Labs Lab 03/05/16 1909 03/05/16 1912 03/05/16 2054 03/05/16 2221  WBC 132.2*  --   --   --   CREATININE 1.33*  --  1.38* 1.10  LATICACIDVEN  --  2.32*  --   --     CrCl cannot be calculated (Unknown ideal weight.).    No Known Allergies  Antimicrobials this admission: 6/14 Vanc >>  6/14 Zosyn >>   Dose adjustments this admission: n/a  Microbiology results: 6/14 BCx x2:   UCx:    Thank you for allowing pharmacy to be a part of this patient's care.  Sherlon Handing, PharmD, BCPS Clinical pharmacist, pager [phone] 03/05/2016 11:09 PM

## 2016-03-05 NOTE — H&P (Signed)
History and Physical    SOUL COMPANION [id number] DOB: 17-Mar-1946 DOA: 03/05/2016  Referring MD/NP/PA:   PCP: Vena Austria, MD   Patient coming from:  The patient is coming from home.  At baseline, pt is independent for most of ADL.       Chief Complaint: fall, left arm pain, rectal bleeding  HPI: Dale George is a 70 y.o. male with medical history significant of CLL under observation, remote tongue cancer (s/p of surgery and XRT), CKD-III, dementia, Ataxia, hypertension, hyperlipidemia, gout, who presents with fall, left arm pain, rectal bleeding  he patient reports that around 6 PM last night, he fell when trying to get out of bed. No LOC. He called EMS and they assisted him to standing. He did not want transport at that time. Around midnight, he got up again and had another fall. He remained on the floor on his left side. He is complaining of left arm pain, which is constant, 10 out of 10 in severity, nonradiating. Per EMS, pt was found to have ~200 ml bright red blood per rectum. He reports a chronic mild cough with phlegm which is at baseline. No fever, chills, chest pain or shortness of breath. He denies symptoms of UTI, unilateral weakness. No nausea, vomiting, diarrhea, abdominal pain. Per his daughter, he had colonoscopy approximately 5 years ago which was negative. Patient denies head injury. No headache currently. Per family, pt's mental status is at his baseline.  ED Course: pt was found to have WBC was 32.2 which was 67.7 on 06/26/15, temperature 99.8, hemoglobin 15.8, platelet 101, lactate is 2.32, negative troponin 2, CK 2524, tachycardia, tachypnea, potassium was initially 7.5, but the repeated potassium is 4.7, stable renal function, negative UA, negative chest x-ray for acute abnormalities. 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 Stable 4 mm anterolisthesis at C6-7; severe degenerative changes in the cervical spine; paranasal sinusitis, possibly acute; small bilateral mastoid effusions. Pt is admitted to telemetry bed for further eval and treatment as inpatient.  Review of Systems:   General: no fevers, chills, no changes in body weight, has poor appetite, has fatigue HEENT: no blurry vision, hearing changes or sore throat Pulm: no dyspnea, coughing, wheezing CV: no chest pain, no palpitations Abd: no nausea, vomiting, abdominal pain, diarrhea, constipation. Has rectal bleeding. GU: no dysuria, burning on urination, increased urinary frequency, hematuria  Ext: no leg edema Neuro: no unilateral weakness, numbness, or tingling, no vision change or hearing loss Skin: no rash MSK: has left upper arm pain Heme: No easy bruising.  Travel history: No recent long distant travel.  Allergy: No Known Allergies  Past Medical History  Diagnosis Date  . Cancer (HCC)     tongue cancer  . Hypertension   . Hyperlipidemia   . Lymphocytosis 12/14/2013  . Fatigue 12/14/2013  . CLL (chronic lymphocytic leukemia) ([place]) 12/23/2013  . Gout   . Insomnia   . Chronic kidney disease     Stage 3  . Dementia   . Ataxia     Past Surgical History  Procedure Laterality Date  . Lymph node dissection      Social History:  reports that he quit smoking about 34 years ago. He has never used smokeless tobacco. He reports that he drinks about 1.2 oz of alcohol per week. He reports that he does not use  illicit drugs.  Family History:  Family History  Problem Relation Age of Onset  . Tuberculosis Mother      Prior to Admission medications   Medication Sig Start Date End Date Taking? Authorizing Provider  indomethacin (INDOCIN) 50 MG capsule Take one tablet twice daily only when needed for bad arthritis flares Patient not taking: Reported on 12/20/2015 03/11/13   Posey Boyer, MD  simvastatin  (ZOCOR) 10 MG tablet Take 1 tablet (10 mg total) by mouth at bedtime. Patient not taking: Reported on 03/05/2016 03/11/13   Posey Boyer, MD    Physical Exam: Filed Vitals:   03/05/16 2230 03/05/16 2300 03/05/16 2315 03/05/16 2330  BP: 148/92 148/83 128/83 126/89  Pulse: 121 124 124 124  Temp:      TempSrc:      Resp: 26 24 26 26   SpO2: 100% 99% 98% 99%   General: Not in acute distress. Dry mucus and membrane. HEENT:       Eyes: PERRL, EOMI, no scleral icterus.       ENT: No discharge from the ears and nose, no pharynx injection, no tonsillar enlargement.        Neck: No JVD, no bruit, no mass felt. Heme: No neck lymph node enlargement. Cardiac: S1/S2, RRR, Tachycardia, No murmurs, No gallops or rubs. Pulm: No rales, wheezing, rhonchi or rubs. Abd: Soft, nondistended, nontender, no rebound pain, no organomegaly, BS present. GU: No hematuria Ext: No pitting leg edema bilaterally. 2+DP/PT pulse bilaterally. Musculoskeletal: has tenderness over left upper arm laterally, no swelling. Skin: No rashes.  Neuro: Alert, oriented place and person, but not time, cranial nerves II-XII grossly intact, moves all extremities normally. Psych: Patient is not psychotic, no suicidal or hemocidal ideation.  Labs on Admission: I have personally reviewed following labs and imaging studies  CBC:  Recent Labs Lab 03/05/16 1909 03/05/16 2221  WBC 132.2*  --   NEUTROABS 11.9*  --   HGB 15.8 17.3*  HCT 49.0 51.0  MCV 82.6  --   PLT 101*  --    Basic Metabolic Panel:  Recent Labs Lab 03/05/16 1909 03/05/16 2054 03/05/16 2221  NA 132* 132* 136  K >7.5* 7.4* 4.7  CL 102 104 108  CO2 21* 20*  --   GLUCOSE 136* 122* 114*  BUN 19 21* 32*  CREATININE 1.33* 1.38* 1.10  CALCIUM 8.7* 8.7*  --    GFR: CrCl cannot be calculated (Unknown ideal weight.). Liver Function Tests:  Recent Labs Lab 03/05/16 1909 03/05/16 2054  AST 95* 96*  ALT 55 53  ALKPHOS 95 93  BILITOT 1.7* 1.8*  PROT  7.8 6.7  ALBUMIN 3.8 3.7   No results for input(s): LIPASE, AMYLASE in the last 168 hours. No results for input(s): AMMONIA in the last 168 hours. Coagulation Profile: No results for input(s): INR, PROTIME in the last 168 hours. Cardiac Enzymes:  Recent Labs Lab 03/05/16 1909  CKTOTAL 2524*   BNP (last 3 results) No results for input(s): PROBNP in the last 8760 hours. HbA1C: No results for input(s): HGBA1C in the last 72 hours. CBG: No results for input(s): GLUCAP in the last 168 hours. Lipid Profile: No results for input(s): CHOL, HDL, LDLCALC, TRIG, CHOLHDL, LDLDIRECT in the last 72 hours. Thyroid Function Tests: No results for input(s): TSH, T4TOTAL, FREET4, T3FREE, THYROIDAB in the last 72 hours. Anemia Panel: No results for input(s): VITAMINB12, FOLATE, FERRITIN, TIBC, IRON, RETICCTPCT in the last 72 hours. Urine analysis:  Component Value Date/Time   COLORURINE ORANGE* 03/05/2016 2327   APPEARANCEUR CLEAR 03/05/2016 2327   LABSPEC 1.033* 03/05/2016 2327   PHURINE 5.0 03/05/2016 2327   GLUCOSEU NEGATIVE 03/05/2016 2327   HGBUR SMALL* 03/05/2016 2327   BILIRUBINUR SMALL* 03/05/2016 2327   KETONESUR 15* 03/05/2016 2327   PROTEINUR 30* 03/05/2016 2327   NITRITE NEGATIVE 03/05/2016 2327   LEUKOCYTESUR NEGATIVE 03/05/2016 2327   Sepsis Labs: @LABRCNTIP (procalcitonin:4,lacticidven:4) )No results found for this or any previous visit (from the past 240 hour(s)).   Radiological Exams on Admission: Dg Chest 2 View  03/05/2016  CLINICAL DATA:  Found lying on the floor with bright red bleeding from rectum. Fell yesterday. EXAM: CHEST  2 VIEW COMPARISON:  03/29/2015 FINDINGS: The lungs are clear without airspace disease or pulmonary edema. Heart and mediastinum are within normal limits. Negative for a pneumothorax. Lateral view is limited because the arms are not elevated. No large pleural effusions. Bony thorax is grossly intact. Degenerative changes at the right [facility] joint.  IMPRESSION: No acute chest abnormality. Electronically Signed   By: Markus Daft M.D.   On: 03/05/2016 19:57   Ct Head Wo Contrast  03/05/2016  CLINICAL DATA:  Unwitnessed fall. EXAM: CT HEAD WITHOUT CONTRAST CT CERVICAL SPINE WITHOUT CONTRAST TECHNIQUE: Multidetector CT imaging of the head and cervical spine was performed following the standard protocol without intravenous contrast. Multiplanar CT image reconstructions of the cervical spine were also generated. COMPARISON:  03/29/2015 head and cervical spine CT. FINDINGS: CT HEAD FINDINGS No evidence of parenchymal hemorrhage or extra-axial fluid collection. No mass lesion, mass effect, or midline shift. No CT evidence of acute infarction. Mild intracranial atherosclerosis. Nonspecific moderate subcortical and periventricular white matter hypodensity, most in keeping with chronic small vessel ischemic change. Generalized cerebral volume loss. Cerebral ventricle sizes are stable and concordant with the degree of cerebral volume loss. Mucoperiosteal thickening throughout the visualized left maxillary sinus with associated small fluid level. Mucoperiosteal thickening throughout the dependent frontal sinus, sphenoid sinus and bilateral ethmoidal air cells. Small right greater than left mastoid effusions. No evidence of calvarial fracture. CT CERVICAL SPINE FINDINGS No fracture is detected in the cervical spine. 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 No prevertebral soft tissue swelling. Normal cervical lordosis. Dens is well positioned between the lateral masses of C1. No acute facet subluxation. Marked degenerative disc disease throughout the cervical spine, most prominent at C6-7 with bulky anterior marginal osteophytes at C3-4. Posterior disc bulge at C3-4 effaces the anterior thecal sac with no definite cord flattening, unchanged. Severe bilateral facet arthropathy. Moderate to  severe foraminal stenosis on the left at C3-4. Severe bilateral foraminal stenosis at C4-5. Stable 4 mm anterolisthesis at C6-7. No acute cervical spine subluxation. Small bilateral inferior mastoid effusions. No evidence of intra-axial hemorrhage in the visualized brain. No gross cervical canal hematoma. No significant pulmonary nodules at the visualized lung apices. No cervical adenopathy or other significant neck soft tissue abnormality. IMPRESSION: 1. No evidence of acute intracranial abnormality. No evidence of calvarial fracture. 2. Generalized cerebral volume loss and moderate chronic small vessel ischemia. 3. No cervical spine fracture or acute malalignment. 4. 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 Stable 4 mm anterolisthesis at C6-7. 5. Severe degenerative changes in the cervical spine as described. 6. Paranasal sinusitis, possibly acute. 7. Small bilateral mastoid effusions. Electronically Signed   By: Ilona Sorrel M.D.   On: 03/05/2016 20:47  Ct Cervical Spine Wo Contrast  03/05/2016  CLINICAL DATA:  Unwitnessed fall. EXAM: CT HEAD WITHOUT CONTRAST CT CERVICAL SPINE WITHOUT CONTRAST TECHNIQUE: Multidetector CT imaging of the head and cervical spine was performed following the standard protocol without intravenous contrast. Multiplanar CT image reconstructions of the cervical spine were also generated. COMPARISON:  03/29/2015 head and cervical spine CT. FINDINGS: CT HEAD FINDINGS No evidence of parenchymal hemorrhage or extra-axial fluid collection. No mass lesion, mass effect, or midline shift. No CT evidence of acute infarction. Mild intracranial atherosclerosis. Nonspecific moderate subcortical and periventricular white matter hypodensity, most in keeping with chronic small vessel ischemic change. Generalized cerebral volume loss. Cerebral ventricle sizes are stable and concordant with the degree of cerebral volume loss. Mucoperiosteal thickening throughout the  visualized left maxillary sinus with associated small fluid level. Mucoperiosteal thickening throughout the dependent frontal sinus, sphenoid sinus and bilateral ethmoidal air cells. Small right greater than left mastoid effusions. No evidence of calvarial fracture. CT CERVICAL SPINE FINDINGS No fracture is detected in the cervical spine. 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 No prevertebral soft tissue swelling. Normal cervical lordosis. Dens is well positioned between the lateral masses of C1. No acute facet subluxation. Marked degenerative disc disease throughout the cervical spine, most prominent at C6-7 with bulky anterior marginal osteophytes at C3-4. Posterior disc bulge at C3-4 effaces the anterior thecal sac with no definite cord flattening, unchanged. Severe bilateral facet arthropathy. Moderate to severe foraminal stenosis on the left at C3-4. Severe bilateral foraminal stenosis at C4-5. Stable 4 mm anterolisthesis at C6-7. No acute cervical spine subluxation. Small bilateral inferior mastoid effusions. No evidence of intra-axial hemorrhage in the visualized brain. No gross cervical canal hematoma. No significant pulmonary nodules at the visualized lung apices. No cervical adenopathy or other significant neck soft tissue abnormality. IMPRESSION: 1. No evidence of acute intracranial abnormality. No evidence of calvarial fracture. 2. Generalized cerebral volume loss and moderate chronic small vessel ischemia. 3. No cervical spine fracture or acute malalignment. 4. 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 Stable 4 mm anterolisthesis at C6-7. 5. Severe degenerative changes in the cervical spine as described. 6. Paranasal sinusitis, possibly acute. 7. Small bilateral mastoid effusions. Electronically Signed   By: Ilona Sorrel M.D.   On: 03/05/2016 20:47   Dg Humerus Left  03/05/2016   CLINICAL DATA:  [place] yesterday.  Posterior elbow pain. EXAM: LEFT HUMERUS - 2+ VIEW COMPARISON:  None. FINDINGS: The left humerus appears intact. No evidence of acute fracture or dislocation. No focal bone lesion or bone destruction. Soft tissues are unremarkable. Note that the elbow is not optimally evaluated on humeral films. If there is suspicion of elbow injury, elbow series should be obtained. IMPRESSION: Negative. Electronically Signed   By: Lucienne Capers M.D.   On: 03/05/2016 23:58     EKG: Independently reviewed. Sinus rhythm, QTC 468, tachycardia, LAD, T-wave inversion in aVL and lead I.  Assessment/Plan Principal Problem:   Rhabdomyolysis Active Problems:   Hyperlipidemia   HTN (hypertension)   CLL (chronic lymphocytic leukemia) (HCC)   Thrombocytopenia (HCC)   Fall   Rectal bleeding   Sepsis ([place])   Left arm pain   CKD (chronic kidney disease), stage III   Rhabdomyolysis: Ck=2524. K=4.7. Renal fx stable. This is due to fall and laying on the floor for long time. -Will admit patient to telemetry bed as inpt -INF: NS 3L and then 125  cc/h -repeat CK in AM -Check Phosphorus level  Sepsis: pt meets criteria for sepsis. He has worsening leukocytosis, tachycardia, tachypnea, elevated lactate. Source of infections is not clear. Urinalysis negative. X-ray is negative. -will start IV vancomycin and Zosyn empirically -Follow-up blood culture and urine culture -will get Procalcitonin and trend lactic acid levels per sepsis protocol. -IVF: 3L of NS bolus in ED, followed by 125 cc/h   HTN: bp is 114/79. No taking meds at home -observer bp closely  CLL (chronic lymphocytic leukemia) ([place]): under observation now. Lymphocyte 117.1 today which was 69.5 on 03/30/15, likely due to sepsis. Pt is followed by Dr. Addison Bailey, last seen was on 06/26/15. -f/u by CBC -f/u with Dr. Lanell Matar -if persistently high, may need to consult to hematologist.  Fall: CT-head and C-spin are negative for  acute intracranial abnormalities. The etiology is likely multifactorial, including history of ataxia, multiple comorbidities and deconditioning. -PT/OT -Fall precaution -Treat underlying problems.  Rectal bleeding: Hemoglobin is stable, 15.8 on admission. Hemodynamically stable. Patient had a negative colonoscopy approximately 5 years ago per her daughter. -Check CBC in morning. -FOBT -INR/PTT/type & screen  Left arm pain: X-ray of left humerus is negative for bony abnormalities. -Pain control with prn Percocet  CKD (chronic kidney disease), stage III: stable. Baseline creatinine 1.1-1.5. His creatinine is 1.33, BUN 19. -Follow-up by BMP   DVT ppx: SCD Code Status: Full code Family Communication:  Yes, patient's daughter and ex-wife at bed side Disposition Plan:  Anticipate discharge back to previous home environment Consults called:  none Admission status: tele/inpation       Date of Service 03/06/2016    Ivor Costa [facility] Pager [phone]  If 7PM-7AM, please contact night-coverage [email or web address] Password [email or web address] 03/06/2016, 1:10 AM

## 2016-03-06 ENCOUNTER — Encounter (HOSPITAL_COMMUNITY): Payer: Self-pay | Admitting: *Deleted

## 2016-03-06 DIAGNOSIS — L8990 Pressure ulcer of unspecified site, unspecified stage: Secondary | ICD-10-CM | POA: Diagnosis present

## 2016-03-06 DIAGNOSIS — W19XXXS Unspecified fall, sequela: Secondary | ICD-10-CM

## 2016-03-06 DIAGNOSIS — T796XXA Traumatic ischemia of muscle, initial encounter: Secondary | ICD-10-CM

## 2016-03-06 LAB — CBC
HCT: 47.1 % (ref 39.0–52.0)
Hemoglobin: 15 g/dL (ref 13.0–17.0)
MCH: 26.8 pg (ref 26.0–34.0)
MCHC: 31.8 g/dL (ref 30.0–36.0)
MCV: 84.3 fL (ref 78.0–100.0)
Platelets: 104 10*3/uL — ABNORMAL LOW (ref 150–400)
RBC: 5.59 MIL/uL (ref 4.22–5.81)
RDW: 15.5 % (ref 11.5–15.5)
WBC: 127.4 10*3/uL (ref 4.0–10.5)

## 2016-03-06 LAB — BASIC METABOLIC PANEL
ANION GAP: 4 — AB (ref 5–15)
Anion gap: 9 (ref 5–15)
BUN: 19 mg/dL (ref 6–20)
BUN: 22 mg/dL — ABNORMAL HIGH (ref 6–20)
CALCIUM: 8.1 mg/dL — AB (ref 8.9–10.3)
CO2: 22 mmol/L (ref 22–32)
CO2: 24 mmol/L (ref 22–32)
Calcium: 7.8 mg/dL — ABNORMAL LOW (ref 8.9–10.3)
Chloride: 105 mmol/L (ref 101–111)
Chloride: 110 mmol/L (ref 101–111)
Creatinine, Ser: 1.15 mg/dL (ref 0.61–1.24)
Creatinine, Ser: 1.41 mg/dL — ABNORMAL HIGH (ref 0.61–1.24)
GFR calc Af Amer: >60 mL/min (ref >60)
GFR calc non Af Amer: >60 mL/min (ref >60)
GFR, EST AFRICAN AMERICAN: 57 mL/min — AB (ref >60)
GFR, EST NON AFRICAN AMERICAN: 49 mL/min — AB (ref >60)
Glucose, Bld: 105 mg/dL — ABNORMAL HIGH (ref 65–99)
Glucose, Bld: 108 mg/dL — ABNORMAL HIGH (ref 65–99)
Potassium: 6.1 mmol/L (ref 3.5–5.1)
Potassium: 5.5 mmol/L — ABNORMAL HIGH (ref 3.5–5.1)
SODIUM: 138 mmol/L (ref 135–145)
Sodium: 136 mmol/L (ref 135–145)

## 2016-03-06 LAB — BLOOD CULTURE ID PANEL (REFLEXED)
ACINETOBACTER BAUMANNII: NOT DETECTED
CANDIDA KRUSEI: NOT DETECTED
CANDIDA PARAPSILOSIS: NOT DETECTED
CARBAPENEM RESISTANCE: NOT DETECTED
Candida albicans: NOT DETECTED
Candida glabrata: NOT DETECTED
Candida tropicalis: NOT DETECTED
ENTEROBACTERIACEAE SPECIES: NOT DETECTED
ENTEROCOCCUS SPECIES: NOT DETECTED
Enterobacter cloacae complex: NOT DETECTED
Escherichia coli: NOT DETECTED
Haemophilus influenzae: NOT DETECTED
KLEBSIELLA OXYTOCA: NOT DETECTED
Klebsiella pneumoniae: NOT DETECTED
LISTERIA MONOCYTOGENES: NOT DETECTED
Methicillin resistance: DETECTED — AB
NEISSERIA MENINGITIDIS: NOT DETECTED
PSEUDOMONAS AERUGINOSA: NOT DETECTED
Proteus species: NOT DETECTED
SERRATIA MARCESCENS: NOT DETECTED
STAPHYLOCOCCUS AUREUS BCID: NOT DETECTED
STAPHYLOCOCCUS SPECIES: DETECTED — AB
STREPTOCOCCUS AGALACTIAE: NOT DETECTED
STREPTOCOCCUS SPECIES: NOT DETECTED
Streptococcus pneumoniae: NOT DETECTED
Streptococcus pyogenes: NOT DETECTED
VANCOMYCIN RESISTANCE: NOT DETECTED

## 2016-03-06 LAB — BASIC METABOLIC PANEL WITH GFR
Anion gap: 6 (ref 5–15)
BUN: 20 mg/dL (ref 6–20)
CO2: 22 mmol/L (ref 22–32)
Calcium: 8.7 mg/dL — ABNORMAL LOW (ref 8.9–10.3)
Chloride: 112 mmol/L — ABNORMAL HIGH (ref 101–111)
Creatinine, Ser: 1.25 mg/dL — ABNORMAL HIGH (ref 0.61–1.24)
GFR calc Af Amer: >60 mL/min
GFR calc non Af Amer: 57 mL/min — ABNORMAL LOW
Glucose, Bld: 107 mg/dL — ABNORMAL HIGH (ref 65–99)
Potassium: 6.6 mmol/L (ref 3.5–5.1)
Sodium: 140 mmol/L (ref 135–145)

## 2016-03-06 LAB — LACTIC ACID, PLASMA
Lactic Acid, Venous: 1.9 mmol/L (ref 0.5–2.0)
Lactic Acid, Venous: 1.4 mmol/L (ref 0.5–2.0)

## 2016-03-06 LAB — LIPID PANEL
CHOL/HDL RATIO: 3.7 ratio
Cholesterol: 128 mg/dL (ref 0–200)
HDL: 35 mg/dL — AB (ref >40)
LDL CALC: 80 mg/dL (ref 0–99)
Triglycerides: 65 mg/dL (ref <150)
VLDL: 13 mg/dL (ref 0–40)

## 2016-03-06 LAB — URIC ACID: Uric Acid, Serum: 8.9 mg/dL — ABNORMAL HIGH (ref 4.4–7.6)

## 2016-03-06 LAB — PROCALCITONIN: PROCALCITONIN: 0.34 ng/mL

## 2016-03-06 LAB — CK: CK TOTAL: 1782 U/L — AB (ref 49–397)

## 2016-03-06 LAB — PHOSPHORUS: PHOSPHORUS: 2.5 mg/dL (ref 2.5–4.6)

## 2016-03-06 LAB — PROTIME-INR
INR: 1.41 (ref 0.00–1.49)
Prothrombin Time: 17.4 s — ABNORMAL HIGH (ref 11.6–15.2)

## 2016-03-06 LAB — APTT: APTT: 32 s (ref 24–37)

## 2016-03-06 LAB — PATHOLOGIST SMEAR REVIEW

## 2016-03-06 MED ORDER — SODIUM POLYSTYRENE SULFONATE 15 GM/60ML PO SUSP
30.0000 g | ORAL | Status: AC
  Administered 2016-03-06 (×2): 30 g via ORAL
  Filled 2016-03-06 (×2): qty 120

## 2016-03-06 MED ORDER — DEXTROSE 50 % IV SOLN
1.0000 | Freq: Once | INTRAVENOUS | Status: AC
  Administered 2016-03-06: 50 mL via INTRAVENOUS
  Filled 2016-03-06: qty 50

## 2016-03-06 MED ORDER — SODIUM POLYSTYRENE SULFONATE 15 GM/60ML PO SUSP
30.0000 g | Freq: Once | ORAL | Status: AC
  Administered 2016-03-06: 30 g via ORAL
  Filled 2016-03-06: qty 120

## 2016-03-06 MED ORDER — SODIUM CHLORIDE 0.9 % IV SOLN
1.0000 g | Freq: Once | INTRAVENOUS | Status: AC
  Administered 2016-03-06: 1 g via INTRAVENOUS
  Filled 2016-03-06: qty 10

## 2016-03-06 MED ORDER — INSULIN ASPART 100 UNIT/ML ~~LOC~~ SOLN
10.0000 [IU] | Freq: Once | SUBCUTANEOUS | Status: AC
  Administered 2016-03-06: 10 [IU] via SUBCUTANEOUS

## 2016-03-06 MED ORDER — SODIUM CHLORIDE 0.9 % IV SOLN
INTRAVENOUS | Status: DC
  Administered 2016-03-08: 20:00:00 via INTRAVENOUS
  Administered 2016-03-08: 75 mL/h via INTRAVENOUS

## 2016-03-06 NOTE — Progress Notes (Signed)
Admitted to room [id number] under the services of Dr. Blaine Hamper with the diagnosis of Rhabdomylosis. Alert and oriented to self situation, place but confused with time. Patient stated he lives home alone. Oriented to call bell and room. On tele # 13 ST. Skin noted to have multiple  Blisters. Left hip-Stage 1. Groin and perineum with rash. Kept area cleaned. Condom cath applied.

## 2016-03-06 NOTE — Progress Notes (Signed)
Follow-up:  Notified by RN repeat K+ 6.6. EKG repeated and is w/o changes. Discussed w/ Dr Blaine Hamper who has also reviewed EKG. Will treat w/ Kayexalate, IV Insulin, D50 and Calcium Gluconate. Repeat bmet scheduled for 8 am.   Jeryl [place], NP-C [facility] Pager [phone]

## 2016-03-06 NOTE — Progress Notes (Signed)
PT Cancellation Note  Patient Details Name: Dale George MRN: [id number] DOB: 1946/06/12   Cancelled Treatment:    Reason Eval/Treat Not Completed: Medical issues which prohibited therapy.  Still at critical value after treatment and will try again later as time and pt's values allow.   Ramond Dial 03/06/2016, 11:27 AM    Mee Hives, PT MS Acute Rehab Dept. Number: [place] [facility] [place]

## 2016-03-06 NOTE — Progress Notes (Signed)
Results for JABRE, WASCO (MRN [id number]) as of 03/06/2016 01:39  Ref. Range 03/06/2016 01:12  Potassium Latest Ref Range: 3.5-5.1 mmol/L 6.6 (HH)  MD on call paged awaiting return call.

## 2016-03-06 NOTE — Progress Notes (Signed)
CRITICAL VALUE ALERT  Critical value received:  K+ 6.1  Date of notification:  03/06/16  Time of notification:  8:49  Critical value read back: yes  Nurse who received alert:  Cassell Smiles   MD notified (1st page):  Dr. Cathlean Sauer  Time of first page:  9:06  MD notified (2nd page):  Time of second page:  Responding MD:    Time MD responded

## 2016-03-06 NOTE — Progress Notes (Signed)
OT Cancellation Note  Patient Details Name: Dale George MRN: [id number] DOB: December 30, 1945   Cancelled Treatment:    Reason Eval/Treat Not Completed: Medical issues which prohibited therapy - K+ 6.1 after correction.  Will try back tomorrow.   Darlina Rumpf [place], OTR/L [id number]  03/06/2016, 2:38 PM

## 2016-03-06 NOTE — Care Management Important Message (Signed)
Important Message  Patient Details  Name: Dale George MRN: [id number] Date of Birth: May 31, 1946   Medicare Important Message Given:  Yes    [name] 03/06/2016, 10:01 AM

## 2016-03-06 NOTE — Progress Notes (Signed)
PHARMACY - PHYSICIAN COMMUNICATION CRITICAL VALUE ALERT - BLOOD CULTURE IDENTIFICATION (BCID)  Results for orders placed or performed during the hospital encounter of 03/05/16  Blood Culture ID Panel (Reflexed) (Collected: 03/05/2016  6:30 PM)  Result Value Ref Range   Enterococcus species NOT DETECTED NOT DETECTED   Vancomycin resistance NOT DETECTED NOT DETECTED   Listeria monocytogenes NOT DETECTED NOT DETECTED   Staphylococcus species DETECTED (A) NOT DETECTED   Staphylococcus aureus NOT DETECTED NOT DETECTED   Methicillin resistance DETECTED (A) NOT DETECTED   Streptococcus species NOT DETECTED NOT DETECTED   Streptococcus agalactiae NOT DETECTED NOT DETECTED   Streptococcus pneumoniae NOT DETECTED NOT DETECTED   Streptococcus pyogenes NOT DETECTED NOT DETECTED   Acinetobacter baumannii NOT DETECTED NOT DETECTED   Enterobacteriaceae species NOT DETECTED NOT DETECTED   Enterobacter cloacae complex NOT DETECTED NOT DETECTED   Escherichia coli NOT DETECTED NOT DETECTED   Klebsiella oxytoca NOT DETECTED NOT DETECTED   Klebsiella pneumoniae NOT DETECTED NOT DETECTED   Proteus species NOT DETECTED NOT DETECTED   Serratia marcescens NOT DETECTED NOT DETECTED   Carbapenem resistance NOT DETECTED NOT DETECTED   Haemophilus influenzae NOT DETECTED NOT DETECTED   Neisseria meningitidis NOT DETECTED NOT DETECTED   Pseudomonas aeruginosa NOT DETECTED NOT DETECTED   Candida albicans NOT DETECTED NOT DETECTED   Candida glabrata NOT DETECTED NOT DETECTED   Candida krusei NOT DETECTED NOT DETECTED   Candida parapsilosis NOT DETECTED NOT DETECTED   Candida tropicalis NOT DETECTED NOT DETECTED    Name of physician (or Provider) Contacted: Arrien  Changes to prescribed antibiotics required: None    Kawehi Hostetter S. Alford Highland, PharmD, [place] Clinical Staff Pharmacist Pager [phone]  Eilene Ghazi [facility] 03/06/2016  6:21 PM

## 2016-03-06 NOTE — Progress Notes (Signed)
PROGRESS NOTE    JABRI STURDEVANT  [id number] DOB: [birth year] DOA: 03/05/2016 PCP: Vena Austria, MD (Confirm with patient/family/NH records and if not entered, this HAS to be entered at [facility] point of entry. "No PCP" if truly none.)   Brief Narrative: (Start on day 1 of progress note - keep it brief and live) Impending rhabdomyolisis, complicated by hyeperkalemia and aki. 70 yo male with CLL lives at home alone, had multiple falls, no signs of infection.   Assessment & Plan:   Principal Problem:   Rhabdomyolysis Active Problems:   Hyperlipidemia   HTN (hypertension)   CLL (chronic lymphocytic leukemia) (HCC)   Thrombocytopenia (HCC)   Fall   Rectal bleeding   Sepsis ([place])   Left arm pain   CKD (chronic kidney disease), stage III   Pressure ulcer   1. Cardiovascular. No signs of infection, will hold on antibiotics for now, will continue to follow on cell count, temp curve and cultures. Ua with no infection. Will reduce rate if IV fluids to prevent risk of hypervolemia, blood pressure systolic 92 to [id number], with heart rate of 51. ekg with sinus rhythm, no peak t waves.   2. Pulmonary. No signs of pulmonary infection, chest film personally reviewed, will continue to monitor oxymetry, supplemental 02 per Espy to target 02 sat above 92%, aspiration precautions.  3. Nephrology. Will continue hydration with isotonic saline, at 75 cc.hr, will follow renal panel and cpk in am, avoid hypotension and nephrotoxic medications. CPK trending down, cr at 1.15 from 1.25, will continue kayexalate for hyperkalemia and follow bmp this pm.   4.  Neurology. No confusion or disorientation, patient weak, will have physical therapy evaluation.   5. Hematology. CLL with significant leukocytosis  Patient is at moderate risk for recurrent falls and worsening rhabdomyolisis.    DVT prophylaxis: (Lovenox/Heparin/SCD's/anticoagulated/None (if comfort care) Code Status: (Full/Partial - specify  details) Family Communication: (Specify name, relationship & date discussed. NO "discussed with patient") Disposition Plan: (specify when and where you expect patient to be discharged). Include barriers to DC in this tab.   Consultants:     Procedures: (Don't include imaging studies which can be auto populated. Include things that cannot be auto populated i.e. Echo, Carotid and venous dopplers, Foley, Bipap, HD, tubes/drains, wound vac, central lines etc)    Antimicrobials: (specify start and planned stop date. Auto populated tables are space occupying and do not give end dates)     Subjective: Patient feeling very weak, no nausea or vomiting, no dyspnea or chest pain, tolerating po well. Lives at home by himself, had multiple falls at home, positive ambulatory dysfunction.   Objective: Filed Vitals:   03/06/16 0100 03/06/16 0548 03/06/16 0639 03/06/16 0920  BP:  92/55 121/61 81/53  Pulse:  84 61 51  Temp:  97.8 F (36.6 C)    TempSrc:  Oral    Resp:  24  20  Height: 6\' 6"  (1.981 m)     Weight: 131.997 kg (291 lb)     SpO2:  96%  99%    Intake/Output Summary (Last 24 hours) at 03/06/16 1054 Last data filed at 03/06/16 1020  Gross per 24 hour  Intake    530 ml  Output      0 ml  Net    530 ml   Filed Weights   03/06/16 0100  Weight: 131.997 kg (291 lb)    Examination:  General exam: deconditioned E ENT: mild conjunctival pallor, oral mucosa dry. Respiratory system:.  Respiratory effort normal.Mild decrease breath sounds at bases, with no wheezing, rales or ronchi.  Cardiovascular system: S1 & S2 heard, RRR. No JVD, murmurs, rubs, gallops or clicks. + non pitting edema. Gastrointestinal system: Abdomen is nondistended, soft and nontender. No organomegaly or masses felt. Normal bowel sounds heard. Central nervous system: Alert and oriented. No focal neurological deficits. Extremities: Symmetric 5 x 5 power. Skin: dry skin, lower ext.  Psychiatry: Judgement and  insight appear normal. Mood & affect appropriate.     Data Reviewed: I have personally reviewed following labs and imaging studies  CBC:  Recent Labs Lab 03/05/16 1909 03/05/16 2221 03/06/16 0112  WBC 132.2*  --  127.4*  NEUTROABS 11.9*  --   --   HGB 15.8 17.3* 15.0  HCT 49.0 51.0 47.1  MCV 82.6  --  84.3  PLT 101*  --  123456*   Basic Metabolic Panel:  Recent Labs Lab 03/05/16 1909 03/05/16 2054 03/05/16 2221 03/06/16 0112 03/06/16 0724  NA 132* 132* 136 140 136  K >7.5* 7.4* 4.7 6.6* 6.1*  CL 102 104 108 112* 105  CO2 21* 20*  --  22 22  GLUCOSE 136* 122* 114* 107* 105*  BUN 19 21* 32* 20 19  CREATININE 1.33* 1.38* 1.10 1.25* 1.15  CALCIUM 8.7* 8.7*  --  8.7* 7.8*  PHOS  --   --   --  2.5  --    GFR: Estimated Creatinine Clearance: 91 mL/min (by C-G formula based on Cr of 1.15). Liver Function Tests:  Recent Labs Lab 03/05/16 1909 03/05/16 2054  AST 95* 96*  ALT 55 53  ALKPHOS 95 93  BILITOT 1.7* 1.8*  PROT 7.8 6.7  ALBUMIN 3.8 3.7   No results for input(s): LIPASE, AMYLASE in the last 168 hours. No results for input(s): AMMONIA in the last 168 hours. Coagulation Profile:  Recent Labs Lab 03/06/16 0112  INR 1.41   Cardiac Enzymes:  Recent Labs Lab 03/05/16 1909 03/06/16 0457  CKTOTAL 2524* 1782*   BNP (last 3 results) No results for input(s): PROBNP in the last 8760 hours. HbA1C: No results for input(s): HGBA1C in the last 72 hours. CBG: No results for input(s): GLUCAP in the last 168 hours. Lipid Profile:  Recent Labs  03/06/16 0111  CHOL 128  HDL 35*  LDLCALC 80  TRIG 65  CHOLHDL 3.7   Thyroid Function Tests: No results for input(s): TSH, T4TOTAL, FREET4, T3FREE, THYROIDAB in the last 72 hours. Anemia Panel: No results for input(s): VITAMINB12, FOLATE, FERRITIN, TIBC, IRON, RETICCTPCT in the last 72 hours. Sepsis Labs:  Recent Labs Lab 03/05/16 1912 03/06/16 0112 03/06/16 0457  PROCALCITON  --  0.34  --     LATICACIDVEN 2.32* 1.9 1.4    No results found for this or any previous visit (from the past 240 hour(s)).       Radiology Studies: Dg Chest 2 View  03/05/2016  CLINICAL DATA:  Found lying on the floor with bright red bleeding from rectum. Fell yesterday. EXAM: CHEST  2 VIEW COMPARISON:  03/29/2015 FINDINGS: The lungs are clear without airspace disease or pulmonary edema. Heart and mediastinum are within normal limits. Negative for a pneumothorax. Lateral view is limited because the arms are not elevated. No large pleural effusions. Bony thorax is grossly intact. Degenerative changes at the right [facility] joint. IMPRESSION: No acute chest abnormality. Electronically Signed   By: Markus Daft M.D.   On: 03/05/2016 19:57   Ct Head Wo Contrast  03/05/2016  CLINICAL DATA:  Unwitnessed fall. EXAM: CT HEAD WITHOUT CONTRAST CT CERVICAL SPINE WITHOUT CONTRAST TECHNIQUE: Multidetector CT imaging of the head and cervical spine was performed following the standard protocol without intravenous contrast. Multiplanar CT image reconstructions of the cervical spine were also generated. COMPARISON:  03/29/2015 head and cervical spine CT. FINDINGS: CT HEAD FINDINGS No evidence of parenchymal hemorrhage or extra-axial fluid collection. No mass lesion, mass effect, or midline shift. No CT evidence of acute infarction. Mild intracranial atherosclerosis. Nonspecific moderate subcortical and periventricular white matter hypodensity, most in keeping with chronic small vessel ischemic change. Generalized cerebral volume loss. Cerebral ventricle sizes are stable and concordant with the degree of cerebral volume loss. Mucoperiosteal thickening throughout the visualized left maxillary sinus with associated small fluid level. Mucoperiosteal thickening throughout the dependent frontal sinus, sphenoid sinus and bilateral ethmoidal air cells. Small right greater than left mastoid effusions. No evidence of calvarial fracture. CT CERVICAL  SPINE FINDINGS No fracture is detected in the cervical spine. 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 No prevertebral soft tissue swelling. Normal cervical lordosis. Dens is well positioned between the lateral masses of C1. No acute facet subluxation. Marked degenerative disc disease throughout the cervical spine, most prominent at C6-7 with bulky anterior marginal osteophytes at C3-4. Posterior disc bulge at C3-4 effaces the anterior thecal sac with no definite cord flattening, unchanged. Severe bilateral facet arthropathy. Moderate to severe foraminal stenosis on the left at C3-4. Severe bilateral foraminal stenosis at C4-5. Stable 4 mm anterolisthesis at C6-7. No acute cervical spine subluxation. Small bilateral inferior mastoid effusions. No evidence of intra-axial hemorrhage in the visualized brain. No gross cervical canal hematoma. No significant pulmonary nodules at the visualized lung apices. No cervical adenopathy or other significant neck soft tissue abnormality. IMPRESSION: 1. No evidence of acute intracranial abnormality. No evidence of calvarial fracture. 2. Generalized cerebral volume loss and moderate chronic small vessel ischemia. 3. No cervical spine fracture or acute malalignment. 4. 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 Stable 4 mm anterolisthesis at C6-7. 5. Severe degenerative changes in the cervical spine as described. 6. Paranasal sinusitis, possibly acute. 7. Small bilateral mastoid effusions. Electronically Signed   By: Ilona Sorrel M.D.   On: 03/05/2016 20:47   Ct Cervical Spine Wo Contrast  03/05/2016  CLINICAL DATA:  Unwitnessed fall. EXAM: CT HEAD WITHOUT CONTRAST CT CERVICAL SPINE WITHOUT CONTRAST TECHNIQUE: Multidetector CT imaging of the head and cervical spine was performed following the standard protocol without intravenous contrast. Multiplanar CT image  reconstructions of the cervical spine were also generated. COMPARISON:  03/29/2015 head and cervical spine CT. FINDINGS: CT HEAD FINDINGS No evidence of parenchymal hemorrhage or extra-axial fluid collection. No mass lesion, mass effect, or midline shift. No CT evidence of acute infarction. Mild intracranial atherosclerosis. Nonspecific moderate subcortical and periventricular white matter hypodensity, most in keeping with chronic small vessel ischemic change. Generalized cerebral volume loss. Cerebral ventricle sizes are stable and concordant with the degree of cerebral volume loss. Mucoperiosteal thickening throughout the visualized left maxillary sinus with associated small fluid level. Mucoperiosteal thickening throughout the dependent frontal sinus, sphenoid sinus and bilateral ethmoidal air cells. Small right greater than left mastoid effusions. No evidence of calvarial fracture. CT CERVICAL SPINE FINDINGS No fracture is detected in the cervical spine. 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 No prevertebral soft tissue swelling.  Normal cervical lordosis. Dens is well positioned between the lateral masses of C1. No acute facet subluxation. Marked degenerative disc disease throughout the cervical spine, most prominent at C6-7 with bulky anterior marginal osteophytes at C3-4. Posterior disc bulge at C3-4 effaces the anterior thecal sac with no definite cord flattening, unchanged. Severe bilateral facet arthropathy. Moderate to severe foraminal stenosis on the left at C3-4. Severe bilateral foraminal stenosis at C4-5. Stable 4 mm anterolisthesis at C6-7. No acute cervical spine subluxation. Small bilateral inferior mastoid effusions. No evidence of intra-axial hemorrhage in the visualized brain. No gross cervical canal hematoma. No significant pulmonary nodules at the visualized lung apices. No cervical adenopathy or other  significant neck soft tissue abnormality. IMPRESSION: 1. No evidence of acute intracranial abnormality. No evidence of calvarial fracture. 2. Generalized cerebral volume loss and moderate chronic small vessel ischemia. 3. No cervical spine fracture or acute malalignment. 4. 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 Stable 4 mm anterolisthesis at C6-7. 5. Severe degenerative changes in the cervical spine as described. 6. Paranasal sinusitis, possibly acute. 7. Small bilateral mastoid effusions. Electronically Signed   By: Ilona Sorrel M.D.   On: 03/05/2016 20:47   Dg Humerus Left  03/05/2016  CLINICAL DATA:  [place] yesterday.  Posterior elbow pain. EXAM: LEFT HUMERUS - 2+ VIEW COMPARISON:  None. FINDINGS: The left humerus appears intact. No evidence of acute fracture or dislocation. No focal bone lesion or bone destruction. Soft tissues are unremarkable. Note that the elbow is not optimally evaluated on humeral films. If there is suspicion of elbow injury, elbow series should be obtained. IMPRESSION: Negative. Electronically Signed   By: Lucienne Capers M.D.   On: 03/05/2016 23:58        Scheduled Meds: . piperacillin-tazobactam (ZOSYN)  IV  3.375 g Intravenous Q8H  . sodium chloride flush  3 mL Intravenous Q12H  . sodium polystyrene  30 g Oral Q4H  . vancomycin  1,250 mg Intravenous Q12H   Continuous Infusions: . sodium chloride       LOS: 1 day        Kyriakos Babler Gerome Apley, MD [facility] Pager [phone]  If 7PM-7AM, please contact night-coverage [email or web address] Password [facility] 03/06/2016, 10:54 AM

## 2016-03-06 NOTE — Progress Notes (Signed)
PT Cancellation Note  Patient Details Name: Dale George MRN: [id number] DOB: [birth year]   Cancelled Treatment:    Reason Eval/Treat Not Completed: Medical issues which prohibited therapy (critical lab values).  Will check back later to see if pt is available and able to be seen.   Ramond Dial 03/06/2016, 8:15 AM    Mee Hives, PT MS Acute Rehab Dept. Number: [place] and [place]

## 2016-03-07 LAB — CBC WITH DIFFERENTIAL/PLATELET
BAND NEUTROPHILS: 0 %
Basophils Absolute: 0 10*3/uL (ref 0.0–0.1)
Basophils Relative: 0 %
Blasts: 0 %
EOS ABS: 0 10*3/uL (ref 0.0–0.7)
EOS PCT: 0 %
HCT: 43 % (ref 39.0–52.0)
HEMOGLOBIN: 13.6 g/dL (ref 13.0–17.0)
LYMPHS ABS: 62.5 10*3/uL — AB (ref 0.7–4.0)
Lymphocytes Relative: 67 %
MCH: 26.5 pg (ref 26.0–34.0)
MCHC: 31.6 g/dL (ref 30.0–36.0)
MCV: 83.7 fL (ref 78.0–100.0)
METAMYELOCYTES PCT: 0 %
MONOS PCT: 0 %
MYELOCYTES: 0 %
Monocytes Absolute: 0 10*3/uL — ABNORMAL LOW (ref 0.1–1.0)
NEUTROS ABS: 30.8 10*3/uL — AB (ref 1.7–7.7)
Neutrophils Relative %: 33 %
Other: 0 %
Platelets: 70 10*3/uL — ABNORMAL LOW (ref 150–400)
Promyelocytes Absolute: 0 %
RBC: 5.14 MIL/uL (ref 4.22–5.81)
RDW: 15.7 % — AB (ref 11.5–15.5)
WBC: 93.3 10*3/uL (ref 4.0–10.5)
nRBC: 0 /100 WBC

## 2016-03-07 LAB — URINE CULTURE
Culture: NO GROWTH
Special Requests: NORMAL

## 2016-03-07 LAB — BASIC METABOLIC PANEL
Anion gap: 8 (ref 5–15)
BUN: 16 mg/dL (ref 6–20)
CHLORIDE: 111 mmol/L (ref 101–111)
CO2: 22 mmol/L (ref 22–32)
Calcium: 8.4 mg/dL — ABNORMAL LOW (ref 8.9–10.3)
Creatinine, Ser: 1.07 mg/dL (ref 0.61–1.24)
GFR calc Af Amer: >60 mL/min (ref >60)
GFR calc non Af Amer: >60 mL/min (ref >60)
Glucose, Bld: 93 mg/dL (ref 65–99)
Potassium: 5.3 mmol/L — ABNORMAL HIGH (ref 3.5–5.1)
SODIUM: 141 mmol/L (ref 135–145)

## 2016-03-07 LAB — CK: CK TOTAL: 769 U/L — AB (ref 49–397)

## 2016-03-07 MED ORDER — OXYCODONE-ACETAMINOPHEN 5-325 MG PO TABS
1.0000 | ORAL_TABLET | ORAL | Status: DC | PRN
  Administered 2016-03-07 – 2016-03-10 (×3): 1 via ORAL
  Filled 2016-03-07 (×4): qty 1

## 2016-03-07 MED ORDER — NYSTATIN 100000 UNIT/GM EX OINT
TOPICAL_OINTMENT | Freq: Two times a day (BID) | CUTANEOUS | Status: DC
  Administered 2016-03-07 – 2016-03-12 (×9): via TOPICAL
  Administered 2016-03-12 (×2): 1 via TOPICAL
  Administered 2016-03-12 – 2016-03-14 (×4): via TOPICAL
  Filled 2016-03-07 (×2): qty 15

## 2016-03-07 NOTE — Consult Note (Signed)
WOC wound consult note Reason for Consult:  Multiple wounds Wound type: partial thickness skin abrasions to the bilateral feet, right knee, left hip, left flank.  Wound bed: wounds are all clean, with minimal serous drainage.  Periwound: patient is noted to have darkening of the bilateral LE, most likely related to venous insuffiencey  Patient is also noted to have intense erythema of the scrotum and bilateral groin, not sure if this patient is incontinent or not.  Would most likely be related to moisture associated skin damage.  Dressing procedure/placement/frequency: Soft silicone foam to the LE wounds.   Add antimicrobial wicking fabric in the skin folds under the patient's abdomen for tx MASD with fungal overgrowth.   Discussed POC with patient and bedside nurse.  Re consult if needed, will not follow at this time. Thanks  Azzie Thiem [place], [place] [phone])

## 2016-03-07 NOTE — Progress Notes (Signed)
PT Cancellation Note  Patient Details Name: Dale George MRN: [id number] DOB: 06-13-1946   Cancelled Treatment:    Reason Eval/Treat Not Completed: Other (comment) (Refused, very poor feeling and fatigued).  Try later as time and pt allow.   Ramond Dial 03/07/2016, 10:04 AM    Mee Hives, PT MS Acute Rehab Dept. Number: [place] and [place]

## 2016-03-07 NOTE — Progress Notes (Signed)
OT Cancellation Note  Patient Details Name: Dale George MRN: [id number] DOB: [birth year]   Cancelled Treatment:    Reason Eval/Treat Not Completed: Patient declined, no reason specified. Will check back as schedule and patient allows. Thank you for the order.   Chrys Racer , MS, OTR/L, CLT  03/07/2016, 11:27 AM

## 2016-03-07 NOTE — Clinical Social Work Note (Signed)
Clinical Social Work Assessment  Patient Details  Name: Dale George MRN: [id number] Date of Birth: 17-Apr-1946  Date of referral:  03/07/16               Reason for consult:  Facility Placement                Permission sought to share information with:  Other (CSW talked with patient's daughter. Patient oriented to Per, Place only) Permission granted to share information::  No (Patient oriented to person, place only)  Name::     Dale George  Agency::     Relationship::  Daughter  Contact Information:  [phone]  Housing/Transportation Living arrangements for the past 2 months:  Apartment Source of Information:  Adult Children Dale George) Patient Interpreter Needed:  None Criminal Activity/Legal Involvement Pertinent to Current Situation/Hospitalization:  No - Comment as needed Significant Relationships:  Adult Children Lives with:  Self Do you feel safe going back to the place where you live?  No (Daughter aware that patient not safe at home and needs rehab and then LTC in a SNF or ALF) Need for family participation in patient care:  Yes (Comment)  Care giving concerns: Daughter very concerned about patient living alone and knows that he can no longer appropriately and safely care for himself at home, due to increasing confusion, and cognitive decline.   Social Worker assessment / plan:  CSW and nurse case manager Dale George talked with patient's daughter, Dale George regarding discharge planning and her concerns that patient no longer safe to be at home alone. Daughter very concerned about her father and open with CSW regarding the changes she has observed with her father. Dale George reported that she began to notice a significant change in patient's cognition and ability to adequately care for himself last summer and since that time the decline has been rapid.  Dale George reported that her dad is agitated all the time, has tremendous difficulty rotating himself in bed,  transferring, and ambulating (uses a walker). Patient also has difficulty in identifying objects and their appropriate use (will pick-up an object to make a phone call). Patient is also incontinent per daughter and cannot clean up after himself. Dale George indicated that she has children and cannot care for her father and knows that he needs rehab and then LTC. Nurse case manager made daughter aware that patient has refused PT 3 times, and Dale George voiced that she can be available to assure patient participates in PT/OT evaluations if she knows when they will happen.  Daughter agreeable to ST rehab and LTC also discussed, along with applying for Medicaid while patient in the facility for LTC.   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)  Patient/Family's Response to care:  Daughter voices no concern regarding care during hospitalization.  Patient/Family's Understanding of and Emotional Response to Diagnosis, Current Treatment, and Prognosis:  Daughter aware that her dad probably has dementia and that it is progressing and his need for care in a facility that can meet his needs.  Emotional Assessment Appearance:  Other (Comment Required (Did not visit with patient.) Attitude/Demeanor/Rapport:  Unable to Assess Affect (typically observed):  Unable to Assess Orientation:  Oriented to Self, Oriented to Place Alcohol / Substance use:  Tobacco Use, Alcohol Use (Patient reports that  he quit smoking 34 years ago and that he drinks 1.2 oz of alcohol per week.) Psych involvement (Current and /or in the community):  No (Comment)  Discharge Needs  Concerns to be addressed:  Discharge Planning Concerns, Cognitive Concerns Readmission within the last 30 days:  No Current discharge risk:   None Barriers to Discharge:  No Barriers Identified   Dale Feil, LCSW 03/07/2016, 5:46 PM

## 2016-03-07 NOTE — Care Management Important Message (Signed)
Important Message  Patient Details  Name: Dale George MRN: [id number] Date of Birth: Mar 26, 1946   Medicare Important Message Given:  Yes    [name] 03/07/2016, 9:32 AM

## 2016-03-07 NOTE — Progress Notes (Signed)
[facility] notified that pt HR 140's non sustained pt asymptomatic bp charted in EPIC no new orders at this time. Will continue to monitor. Arthor Captain LPN

## 2016-03-07 NOTE — Progress Notes (Signed)
   03/07/16 1400  Clinical Encounter Type  Visited With Patient  Visit Type Spiritual support  Referral From Nurse  Consult/Referral To Chaplain  Spiritual Encounters  Spiritual Needs Prayer;Emotional  Stress Factors  Patient Stress Factors Exhausted;Health changes;Loss of control  Chaplain visit made per nursing request, patient provided emotional support, spiritual presence, also assisted with making call to daughter. Advised that further services are available upon request 24/7.

## 2016-03-07 NOTE — Progress Notes (Signed)
PROGRESS NOTE    Dale George  [id number] DOB: Feb 10, 1946 DOA: 03/05/2016 PCP: Vena Austria, MD (Confirm with patient/family/NH records and if not entered, this HAS to be entered at [facility] point of entry. "No PCP" if truly none.)   Brief Narrative: (Start on day 1 of progress note - keep it brief and live) Impending rhabdomyolisis, complicated by hyeperkalemia and aki. 70 yo male with CLL lives at home alone, had multiple falls, no signs of infection. Will need placement.   Assessment & Plan:   Principal Problem:   Rhabdomyolysis Active Problems:   Hyperlipidemia   HTN (hypertension)   CLL (chronic lymphocytic leukemia) (HCC)   Thrombocytopenia (HCC)   Fall   Rectal bleeding   Sepsis ([place])   Left arm pain   CKD (chronic kidney disease), stage III   Pressure ulcer AKI  1. Cardiovascular. Patient continue to be hemodynamic stable, blood culture coag negative staph X1, possible contamination, will continue to hold on antibiotics. Patient afebrile and cell count at 93 ( CLL).   2. Pulmonary. Will continue to monitor oxymetry, supplemental 02 per [place], aspiration precautions, gentle hydration to to avoid volume overload.   3. Nephrology. Cr trending down with 1.0 from 1.4, cpk trending down to 769, will follow on renal panel, will change fluids to 1/2 saline at 75 cc/hr to avoid hyperNatremia and hyperchloremia, noted serum bicarb to be 22.   4. Neurology. No confusion or disorientation, patient weak, will have physical therapy evaluation.   5. Hematology. CLL with significant leukocytosis. No signs of infection, will continue to follow cell count.  Platelets down to 70 from 104, will continue supportive therapy no signs of bleeding.  6, Skin.Tinae cruris, will consult wound care team and will apply nystatin bid. No sacral ulcers noted.   7. DVT prophylaxis  Patient is at moderate risk for recurrent falls and worsening rhabdomyolisis.    DVT prophylaxis:  Lovenox Code Status: (Full/Partial - specify details) Family Communication: (Specify name, relationship & date discussed. NO "discussed with patient") Disposition Plan: (specify when and where you expect patient to be discharged). Include barriers to DC in this tab.   Consultants:     Procedures: (Don't include imaging studies which can be auto populated. Include things that cannot be auto populated i.e. Echo, Carotid and venous dopplers, Foley, Bipap, HD, tubes/drains, wound vac, central lines etc)    Antimicrobials: (specify start and planned stop date. Auto populated tables are space occupying and do not give end dates)     Subjective: Patient this am with right lower extremity pain, moderate in intensity,   Objective: Filed Vitals:   03/06/16 2030 03/07/16 0130 03/07/16 0530 03/07/16 0739  BP: 118/77 117/81 123/68 138/93  Pulse: 108 107 105 114  Temp: 98.4 F (36.9 C) 98.2 F (36.8 C) 99.7 F (37.6 C) 97.7 F (36.5 C)  TempSrc: Oral Oral Axillary Oral  Resp: 20 20 20 18   Height:      Weight:  131.77 kg (290 lb 8 oz)    SpO2: 100% 100% 97% 98%    Intake/Output Summary (Last 24 hours) at 03/07/16 0854 Last data filed at 03/07/16 0645  Gross per 24 hour  Intake   1965 ml  Output    253 ml  Net   1712 ml   Filed Weights   03/06/16 0100 03/07/16 0130  Weight: 131.997 kg (291 lb) 131.77 kg (290 lb 8 oz)    Examination:  General exam:  Deconditioned and ill looking appearing.  E ENT: mild conjunctival pallor, no icterus Respiratory system: Respiratory effort normal. Decreased breath sounds at the dependent zones, no wheezing, rales or rhonchi. Cardiovascular system: S1 & S2 heard, RRR. No JVD, murmurs, rubs, gallops or clicks. No pedal edema. Gastrointestinal system: Abdomen is nondistended, soft and nontender. No organomegaly or masses felt. Normal bowel sounds heard. Central nervous system: Alert and oriented. No focal neurological deficits. Extremities:  Symmetric 5 x 5 power. Skin: positive erythema on the scrotum and groins, well defined margins in patches.  Psychiatry: Judgement and insight appear normal. Mood & affect appropriate.     Data Reviewed: I have personally reviewed following labs and imaging studies  CBC:  Recent Labs Lab 03/05/16 1909 03/05/16 2221 03/06/16 0112  WBC 132.2*  --  127.4*  NEUTROABS 11.9*  --   --   HGB 15.8 17.3* 15.0  HCT 49.0 51.0 47.1  MCV 82.6  --  84.3  PLT 101*  --  123456*   Basic Metabolic Panel:  Recent Labs Lab 03/05/16 1909 03/05/16 2054 03/05/16 2221 03/06/16 0112 03/06/16 0724 03/06/16 1549  NA 132* 132* 136 140 136 138  K >7.5* 7.4* 4.7 6.6* 6.1* 5.5*  CL 102 104 108 112* 105 110  CO2 21* 20*  --  22 22 24   GLUCOSE 136* 122* 114* 107* 105* 108*  BUN 19 21* 32* 20 19 22*  CREATININE 1.33* 1.38* 1.10 1.25* 1.15 1.41*  CALCIUM 8.7* 8.7*  --  8.7* 7.8* 8.1*  PHOS  --   --   --  2.5  --   --    GFR: Estimated Creatinine Clearance: 74.2 mL/min (by C-G formula based on Cr of 1.41). Liver Function Tests:  Recent Labs Lab 03/05/16 1909 03/05/16 2054  AST 95* 96*  ALT 55 53  ALKPHOS 95 93  BILITOT 1.7* 1.8*  PROT 7.8 6.7  ALBUMIN 3.8 3.7   No results for input(s): LIPASE, AMYLASE in the last 168 hours. No results for input(s): AMMONIA in the last 168 hours. Coagulation Profile:  Recent Labs Lab 03/06/16 0112  INR 1.41   Cardiac Enzymes:  Recent Labs Lab 03/05/16 1909 03/06/16 0457  CKTOTAL 2524* 1782*   BNP (last 3 results) No results for input(s): PROBNP in the last 8760 hours. HbA1C: No results for input(s): HGBA1C in the last 72 hours. CBG: No results for input(s): GLUCAP in the last 168 hours. Lipid Profile:  Recent Labs  03/06/16 0111  CHOL 128  HDL 35*  LDLCALC 80  TRIG 65  CHOLHDL 3.7   Thyroid Function Tests: No results for input(s): TSH, T4TOTAL, FREET4, T3FREE, THYROIDAB in the last 72 hours. Anemia Panel: No results for input(s):  VITAMINB12, FOLATE, FERRITIN, TIBC, IRON, RETICCTPCT in the last 72 hours. Sepsis Labs:  Recent Labs Lab 03/05/16 1912 03/06/16 0112 03/06/16 0457  PROCALCITON  --  0.34  --   LATICACIDVEN 2.32* 1.9 1.4    Recent Results (from the past 240 hour(s))  Culture, blood (routine x 2)     Status: None (Preliminary result)   Collection Time: 03/05/16  6:30 PM  Result Value Ref Range Status   Specimen Description BLOOD RIGHT ANTECUBITAL  Final   Special Requests BOTTLES DRAWN AEROBIC AND ANAEROBIC 5CC  Final   Culture  Setup Time   Final    GRAM POSITIVE COCCI IN CLUSTERS ANAEROBIC BOTTLE ONLY Organism ID to follow CRITICAL RESULT CALLED TO, READ BACK BY AND VERIFIED WITH: C ROBERTSON PHARMD [id number] 03/06/16 A BROWNING  Culture NO GROWTH < 24 HOURS  Final   Report Status PENDING  Incomplete  Blood Culture ID Panel (Reflexed)     Status: Abnormal   Collection Time: 03/05/16  6:30 PM  Result Value Ref Range Status   Enterococcus species NOT DETECTED NOT DETECTED Final   Vancomycin resistance NOT DETECTED NOT DETECTED Final   Listeria monocytogenes NOT DETECTED NOT DETECTED Final   Staphylococcus species DETECTED (A) NOT DETECTED Final    Comment: CRITICAL RESULT CALLED TO, READ BACK BY AND VERIFIED WITH: C ROBERTSON PHARMD [id number] 03/06/16 A BROWNING    Staphylococcus aureus NOT DETECTED NOT DETECTED Final   Methicillin resistance DETECTED (A) NOT DETECTED Final    Comment: CRITICAL RESULT CALLED TO, READ BACK BY AND VERIFIED WITH: C ROBERTSON PHARMD 1818 03/06/16 A BROWNING    Streptococcus species NOT DETECTED NOT DETECTED Final   Streptococcus agalactiae NOT DETECTED NOT DETECTED Final   Streptococcus pneumoniae NOT DETECTED NOT DETECTED Final   Streptococcus pyogenes NOT DETECTED NOT DETECTED Final   Acinetobacter baumannii NOT DETECTED NOT DETECTED Final   Enterobacteriaceae species NOT DETECTED NOT DETECTED Final   Enterobacter cloacae complex NOT DETECTED NOT DETECTED Final    Escherichia coli NOT DETECTED NOT DETECTED Final   Klebsiella oxytoca NOT DETECTED NOT DETECTED Final   Klebsiella pneumoniae NOT DETECTED NOT DETECTED Final   Proteus species NOT DETECTED NOT DETECTED Final   Serratia marcescens NOT DETECTED NOT DETECTED Final   Carbapenem resistance NOT DETECTED NOT DETECTED Final   Haemophilus influenzae NOT DETECTED NOT DETECTED Final   Neisseria meningitidis NOT DETECTED NOT DETECTED Final   Pseudomonas aeruginosa NOT DETECTED NOT DETECTED Final   Candida albicans NOT DETECTED NOT DETECTED Final   Candida glabrata NOT DETECTED NOT DETECTED Final   Candida krusei NOT DETECTED NOT DETECTED Final   Candida parapsilosis NOT DETECTED NOT DETECTED Final   Candida tropicalis NOT DETECTED NOT DETECTED Final  Culture, blood (Routine X 2) w Reflex to ID Panel     Status: None (Preliminary result)   Collection Time: 03/05/16  8:35 PM  Result Value Ref Range Status   Specimen Description BLOOD RIGHT HAND  Final   Special Requests IN PEDIATRIC BOTTLE 2CC  Final   Culture NO GROWTH < 24 HOURS  Final   Report Status PENDING  Incomplete         Radiology Studies: Dg Chest 2 View  03/05/2016  CLINICAL DATA:  Found lying on the floor with bright red bleeding from rectum. Fell yesterday. EXAM: CHEST  2 VIEW COMPARISON:  03/29/2015 FINDINGS: The lungs are clear without airspace disease or pulmonary edema. Heart and mediastinum are within normal limits. Negative for a pneumothorax. Lateral view is limited because the arms are not elevated. No large pleural effusions. Bony thorax is grossly intact. Degenerative changes at the right [facility] joint. IMPRESSION: No acute chest abnormality. Electronically Signed   By: Markus Daft M.D.   On: 03/05/2016 19:57   Ct Head Wo Contrast  03/05/2016  CLINICAL DATA:  Unwitnessed fall. EXAM: CT HEAD WITHOUT CONTRAST CT CERVICAL SPINE WITHOUT CONTRAST TECHNIQUE: Multidetector CT imaging of the head and cervical spine was performed following  the standard protocol without intravenous contrast. Multiplanar CT image reconstructions of the cervical spine were also generated. COMPARISON:  03/29/2015 head and cervical spine CT. FINDINGS: CT HEAD FINDINGS No evidence of parenchymal hemorrhage or extra-axial fluid collection. No mass lesion, mass effect, or midline shift. No CT evidence of acute infarction. Mild intracranial atherosclerosis.  Nonspecific moderate subcortical and periventricular white matter hypodensity, most in keeping with chronic small vessel ischemic change. Generalized cerebral volume loss. Cerebral ventricle sizes are stable and concordant with the degree of cerebral volume loss. Mucoperiosteal thickening throughout the visualized left maxillary sinus with associated small fluid level. Mucoperiosteal thickening throughout the dependent frontal sinus, sphenoid sinus and bilateral ethmoidal air cells. Small right greater than left mastoid effusions. No evidence of calvarial fracture. CT CERVICAL SPINE FINDINGS No fracture is detected in the cervical spine. 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 No prevertebral soft tissue swelling. Normal cervical lordosis. Dens is well positioned between the lateral masses of C1. No acute facet subluxation. Marked degenerative disc disease throughout the cervical spine, most prominent at C6-7 with bulky anterior marginal osteophytes at C3-4. Posterior disc bulge at C3-4 effaces the anterior thecal sac with no definite cord flattening, unchanged. Severe bilateral facet arthropathy. Moderate to severe foraminal stenosis on the left at C3-4. Severe bilateral foraminal stenosis at C4-5. Stable 4 mm anterolisthesis at C6-7. No acute cervical spine subluxation. Small bilateral inferior mastoid effusions. No evidence of intra-axial hemorrhage in the visualized brain. No gross cervical canal hematoma. No significant pulmonary  nodules at the visualized lung apices. No cervical adenopathy or other significant neck soft tissue abnormality. IMPRESSION: 1. No evidence of acute intracranial abnormality. No evidence of calvarial fracture. 2. Generalized cerebral volume loss and moderate chronic small vessel ischemia. 3. No cervical spine fracture or acute malalignment. 4. 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 Stable 4 mm anterolisthesis at C6-7. 5. Severe degenerative changes in the cervical spine as described. 6. Paranasal sinusitis, possibly acute. 7. Small bilateral mastoid effusions. Electronically Signed   By: Ilona Sorrel M.D.   On: 03/05/2016 20:47   Ct Cervical Spine Wo Contrast  03/05/2016  CLINICAL DATA:  Unwitnessed fall. EXAM: CT HEAD WITHOUT CONTRAST CT CERVICAL SPINE WITHOUT CONTRAST TECHNIQUE: Multidetector CT imaging of the head and cervical spine was performed following the standard protocol without intravenous contrast. Multiplanar CT image reconstructions of the cervical spine were also generated. COMPARISON:  03/29/2015 head and cervical spine CT. FINDINGS: CT HEAD FINDINGS No evidence of parenchymal hemorrhage or extra-axial fluid collection. No mass lesion, mass effect, or midline shift. No CT evidence of acute infarction. Mild intracranial atherosclerosis. Nonspecific moderate subcortical and periventricular white matter hypodensity, most in keeping with chronic small vessel ischemic change. Generalized cerebral volume loss. Cerebral ventricle sizes are stable and concordant with the degree of cerebral volume loss. Mucoperiosteal thickening throughout the visualized left maxillary sinus with associated small fluid level. Mucoperiosteal thickening throughout the dependent frontal sinus, sphenoid sinus and bilateral ethmoidal air cells. Small right greater than left mastoid effusions. No evidence of calvarial fracture. CT CERVICAL SPINE FINDINGS No fracture is detected in the cervical  spine. 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 No prevertebral soft tissue swelling. Normal cervical lordosis. Dens is well positioned between the lateral masses of C1. No acute facet subluxation. Marked degenerative disc disease throughout the cervical spine, most prominent at C6-7 with bulky anterior marginal osteophytes at C3-4. Posterior disc bulge at C3-4 effaces the anterior thecal sac with no definite cord flattening, unchanged. Severe bilateral facet arthropathy. Moderate to severe foraminal stenosis on the left at C3-4. Severe bilateral foraminal stenosis at C4-5. Stable 4 mm anterolisthesis at C6-7. No acute cervical spine subluxation. Small bilateral  inferior mastoid effusions. No evidence of intra-axial hemorrhage in the visualized brain. No gross cervical canal hematoma. No significant pulmonary nodules at the visualized lung apices. No cervical adenopathy or other significant neck soft tissue abnormality. IMPRESSION: 1. No evidence of acute intracranial abnormality. No evidence of calvarial fracture. 2. Generalized cerebral volume loss and moderate chronic small vessel ischemia. 3. No cervical spine fracture or acute malalignment. 4. 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 Stable 4 mm anterolisthesis at C6-7. 5. Severe degenerative changes in the cervical spine as described. 6. Paranasal sinusitis, possibly acute. 7. Small bilateral mastoid effusions. Electronically Signed   By: Ilona Sorrel M.D.   On: 03/05/2016 20:47   Dg Humerus Left  03/05/2016  CLINICAL DATA:  [place] yesterday.  Posterior elbow pain. EXAM: LEFT HUMERUS - 2+ VIEW COMPARISON:  None. FINDINGS: The left humerus appears intact. No evidence of acute fracture or dislocation. No focal bone lesion or bone destruction. Soft tissues are unremarkable. Note that the elbow is not optimally evaluated on humeral films. If there is  suspicion of elbow injury, elbow series should be obtained. IMPRESSION: Negative. Electronically Signed   By: Lucienne Capers M.D.   On: 03/05/2016 23:58        Scheduled Meds: . sodium chloride flush  3 mL Intravenous Q12H   Continuous Infusions: . sodium chloride 75 mL/hr at 03/06/16 1100     LOS: 2 days       Joaopedro Eschbach Gerome Apley, MD [facility] Pager [phone]  If 7PM-7AM, please contact night-coverage [email or web address] Password [facility] 03/07/2016, 8:54 AM

## 2016-03-07 NOTE — Care Management Note (Addendum)
Case Management Note  Patient Details  Name: Dale George MRN: [id number] Date of Birth: 08/09/1946  Subjective/Objective:       CM following for progress and d/c planning.              Action/Plan: 03/07/2016 Chart reviewed , noted the PT not initiated yesterday due to elevated electrolytes and today pt declined.  Met with pt re d/c planning, pt somewhat disoriented, difficult to make conversation as pt states that he did not understand why PT came to see him or what he was suspose to do. Pt ask several time what he was suspose to do next. Per pt he lives alone with daughter near by. States that his daughter brings food as he is unable to get out or shop. Pt states that he uses a walker at home  Pt not clear , however states that this CM may contact his daughter, he is unable to remember her number.  Discussed with CSW, Dale George, this CM will contact daughter.  03/07/2016 3:15 pm, met with pt daughter, Dale George, CSW Dale George to discuss d/c needs. This pt has become increasingly confused and debilitated per daughter. Has been using his walker at home, is incontinent, daughter visits daily, provides meals. Pt with multiple falls lately. Spoke with Dale George with Physical Therapy who states that PT will see this pt over there weekend and will call pt daughter to ask her to be here when they come to evaluate so that pt will be more likely to work with the therapist for a adequate evaluation. Will continue to follow.  Expected Discharge Date:  03/10/16               Expected Discharge Plan:  Skilled Nursing Facility  In-House Referral:  Clinical Social Work  Discharge planning Services  CM Consult  Post Acute Care Choice:    Choice offered to:     DME Arranged:    DME Agency:     HH Arranged:    [place] Agency:     Status of Service:  In process, will continue to follow  Medicare Important Message Given:  Yes Date Medicare IM Given:    Medicare IM give by:    Date Additional  Medicare IM Given:    Additional Medicare Important Message give by:     If discussed at [place] of Stay Meetings, dates discussed:    Additional Comments:  Dale Bene, RN 03/07/2016, 2:08 PM

## 2016-03-07 NOTE — Care Management Note (Signed)
Case Management Note  Patient Details  Name: RUE ROUTON MRN: [id number] Date of Birth: 1945-12-26  Subjective/Objective:                    Action/Plan:   Expected Discharge Date:  03/10/16               Expected Discharge Plan:  Skilled Nursing Facility  In-House Referral:  Clinical Social Work  Discharge planning Services  CM Consult  Post Acute Care Choice:    Choice offered to:     DME Arranged:    DME Agency:     HH Arranged:    [place] Agency:     Status of Service:  In process, will continue to follow  Medicare Important Message Given:  Yes Date Medicare IM Given:    Medicare IM give by:    Date Additional Medicare IM Given:    Additional Medicare Important Message give by:     If discussed at [place] of Stay Meetings, dates discussed:    Additional Comments:  Adron Bene, RN 03/07/2016, 3:53 PM

## 2016-03-08 DIAGNOSIS — T796XXD Traumatic ischemia of muscle, subsequent encounter: Secondary | ICD-10-CM

## 2016-03-08 LAB — BASIC METABOLIC PANEL
ANION GAP: 11 (ref 5–15)
BUN: 13 mg/dL (ref 6–20)
CALCIUM: 8.1 mg/dL — AB (ref 8.9–10.3)
CHLORIDE: 103 mmol/L (ref 101–111)
CO2: 21 mmol/L — AB (ref 22–32)
Creatinine, Ser: 1 mg/dL (ref 0.61–1.24)
GFR calc non Af Amer: >60 mL/min (ref >60)
Glucose, Bld: 100 mg/dL — ABNORMAL HIGH (ref 65–99)
POTASSIUM: 4.5 mmol/L (ref 3.5–5.1)
Sodium: 135 mmol/L (ref 135–145)

## 2016-03-08 LAB — CBC WITH DIFFERENTIAL/PLATELET
BASOS PCT: 0 %
Basophils Absolute: 0 10*3/uL (ref 0.0–0.1)
EOS PCT: 0 %
Eosinophils Absolute: 0 10*3/uL (ref 0.0–0.7)
HEMATOCRIT: 43 % (ref 39.0–52.0)
HEMOGLOBIN: 13.9 g/dL (ref 13.0–17.0)
LYMPHS PCT: 89 %
Lymphs Abs: 87.3 10*3/uL — ABNORMAL HIGH (ref 0.7–4.0)
MCH: 26.6 pg (ref 26.0–34.0)
MCHC: 32.3 g/dL (ref 30.0–36.0)
MCV: 82.4 fL (ref 78.0–100.0)
MONOS PCT: 2 %
Monocytes Absolute: 2 10*3/uL — ABNORMAL HIGH (ref 0.1–1.0)
NEUTROS PCT: 9 %
Neutro Abs: 8.8 10*3/uL — ABNORMAL HIGH (ref 1.7–7.7)
Platelets: 82 10*3/uL — ABNORMAL LOW (ref 150–400)
RBC: 5.22 MIL/uL (ref 4.22–5.81)
RDW: 15.7 % — AB (ref 11.5–15.5)
WBC: 98.1 10*3/uL — AB (ref 4.0–10.5)

## 2016-03-08 LAB — CULTURE, BLOOD (ROUTINE X 2)

## 2016-03-08 NOTE — Progress Notes (Signed)
PROGRESS NOTE                                                                                                                                                                                                             Patient Demographics:    Mateja Der, is a 70 y.o. male, DOB - 1946/03/02, [id number]  Admit date - 03/05/2016   Admitting Physician Ivor Costa, MD  Outpatient Primary MD for the patient is Vena Austria, MD  LOS - 3  Outpatient Specialists: Oncology (Dr. Alvy Bimler) Neurology (Dr. Rexene Alberts)  Chief Complaint  Patient presents with  . 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 Patient called EMS who found him on the floor and complained of left arm pain. He was also found to have about 200 mL bright red blood per rectum. In the ED he had WBC of 32K, temperature 99.8 F, normal hemoglobin, platelets of 101, lactate of 2.3 and CK of 2500. He was tachycardic and tachypnea with potassium of 7.5 (repeated of 4.7). UA and chest x-ray unremarkable. Head CT and cervical spine negative for acute fracture or abnormality. Patient admitted for acute rhabdomyolysis .   Subjective:   Patient complains of pain in his right thigh. Reports feeling very weak.   Assessment  & Plan :    Principal Problem: Sepsis with Acute rhabdomyolysis Secondary to fall and possible dehydration.no signs of infection. CPK improving with IV fluids. (2500-->750). Statin discontinued. Seen by PT and recommend skilled nursing facility.  Active Problems:   Acute on chronic kidney injury stage III Mild. Resolved with IV fluids.  Generalized weakness with frequent falls Seen by PT and recommend  skilled nursing facility. Daughter concerned that patient cannot go back home and live by himself. [facility] for short-term rehabilitation with possible long-term skilled care thereafter.  Multiple wounds with skin abrasion in bilateral feet knee, hip and flank Appreciate wound care evaluation.  Chronic thrombocytopenia Stable.     CLL (chronic lymphocytic leukemia) (HCC) Chronically elevated WBC. Follows with Dr. Alvy Bimler    Thrombocytopenia [facility]) Chronic     Rectal bleeding No episodes while in the hospital.  H&H stable.         Code Status : full code  Family Communication  : none  Disposition Plan  : SNF possibly on 6/19  Barriers For Discharge : weakness, improving CPK, plcement  Consults  :  none  Procedures  : CT head and cervical spine  DVT Prophylaxis  : scd  Lab Results  Component Value Date   PLT 82* 03/08/2016    Antibiotics  :    Anti-infectives    Start     Dose/Rate Route Frequency Ordered Stop   03/06/16 1200  vancomycin (VANCOCIN) 1,250 mg in sodium chloride 0.9 % 250 mL IVPB  Status:  Discontinued     1,250 mg 166.7 mL/hr over 90 Minutes Intravenous Every 12 hours 03/05/16 2317 03/06/16 1059   03/06/16 0600  piperacillin-tazobactam (ZOSYN) IVPB 3.375 g  Status:  Discontinued     3.375 g 12.5 mL/hr over 240 Minutes Intravenous Every 8 hours 03/05/16 2317 03/06/16 1059   03/05/16 2330  piperacillin-tazobactam (ZOSYN) IVPB 3.375 g     3.375 g 100 mL/hr over 30 Minutes Intravenous STAT 03/05/16 2317 03/06/16 0033   03/05/16 2330  vancomycin (VANCOCIN) 2,500 mg in sodium chloride 0.9 % 500 mL IVPB  Status:  Discontinued     2,500 mg 250 mL/hr over 120 Minutes Intravenous Every 12 hours 03/05/16 2317 03/05/16 2318   03/05/16 2330  vancomycin (VANCOCIN) 2,500 mg in sodium chloride 0.9 % 500 mL IVPB     2,500 mg 250 mL/hr over 120 Minutes Intravenous  Once 03/05/16 2318 03/06/16 0203        Objective:   Filed Vitals:   03/07/16 2105 03/07/16  2200 03/08/16 0541 03/08/16 0925  BP: 125/60 137/86 147/71 115/60  Pulse: 65 115 58 68  Temp: 97.6 F (36.4 C)  98.2 F (36.8 C) 97.9 F (36.6 C)  TempSrc: Oral  Oral Oral  Resp: 18  18 18   Height:      Weight: 131.77 kg (290 lb 8 oz)     SpO2: 97%  95% 96%    Wt Readings from Last 3 Encounters:  03/07/16 131.77 kg (290 lb 8 oz)  12/20/15 150.141 kg (331 lb)  06/26/15 154.631 kg (340 lb 14.4 oz)     Intake/Output Summary (Last 24 hours) at 03/08/16 1408 Last data filed at 03/08/16 [id number]  Gross per 24 hour  Intake   4420 ml  Output   1000 ml  Net   3420 ml     Physical Exam  Gen: not in distress, fatugied HEENT: no pallor, moist mucosa, supple neck Chest: clear b/l, no added sounds CVS: N S1&S2, no murmurs, rubs or gallop GI: soft, NT, ND, BS+ Musculoskeletal: warm, no edema, limited mobility of right knee with tenderness CNS: AAOX3, non focal    Data Review:    CBC  Recent Labs Lab 03/05/16 1909 03/05/16 2221 03/06/16 0112 03/07/16 0816 03/08/16 0540  WBC 132.2*  --  127.4* 93.3* 98.1*  HGB 15.8 17.3* 15.0 13.6 13.9  HCT 49.0 51.0 47.1 43.0 43.0  PLT 101*  --  104* 70* 82*  MCV 82.6  --  84.3 83.7 82.4  MCH 26.6  --  26.8 26.5 26.6  MCHC 32.2  --  31.8 31.6 32.3  RDW 15.1  --  15.5 15.7* 15.7*  LYMPHSABS 117.7*  --   --  62.5* 87.3*  MONOABS 2.6*  --   --  0.0* 2.0*  EOSABS 0.0  --   --  0.0 0.0  BASOSABS 0.0  --   --  0.0 0.0    Chemistries   Recent Labs Lab 03/05/16 1909 03/05/16 2054  03/06/16 0112 03/06/16 0724 03/06/16 1549 03/07/16 0816 03/08/16 0540  NA 132* 132*  < > 140 136 138 141 135  K >7.5* 7.4*  < > 6.6* 6.1* 5.5* 5.3* 4.5  CL 102 104  < > 112* 105 110 111 103  CO2 21* 20*  --  22 22 24 22  21*  GLUCOSE 136* 122*  < > 107* 105* 108* 93 100*  BUN 19 21*  < > 20 19 22* 16 13  CREATININE 1.33* 1.38*  < > 1.25* 1.15 1.41* 1.07 1.00  CALCIUM 8.7* 8.7*  --  8.7* 7.8* 8.1* 8.4* 8.1*  AST 95* 96*  --   --   --   --   --   --     ALT 55 53  --   --   --   --   --   --   ALKPHOS 95 93  --   --   --   --   --   --   BILITOT 1.7* 1.8*  --   --   --   --   --   --   < > = values in this interval not displayed. ------------------------------------------------------------------------------------------------------------------  Recent Labs  03/06/16 0111  CHOL 128  HDL 35*  LDLCALC 80  TRIG 65  CHOLHDL 3.7    No results found for: HGBA1C ------------------------------------------------------------------------------------------------------------------ No results for input(s): TSH, T4TOTAL, T3FREE, THYROIDAB in the last 72 hours.  Invalid input(s): FREET3 ------------------------------------------------------------------------------------------------------------------ No results for input(s): VITAMINB12, FOLATE, FERRITIN, TIBC, IRON, RETICCTPCT in the last 72 hours.  Coagulation profile  Recent Labs Lab 03/06/16 0112  INR 1.41    No results for input(s): DDIMER in the last 72 hours.  Cardiac Enzymes No results for input(s): CKMB, TROPONINI, MYOGLOBIN in the last 168 hours.  Invalid input(s): CK ------------------------------------------------------------------------------------------------------------------ No results found for: BNP  Inpatient Medications  Scheduled Meds: . nystatin ointment   Topical BID  . sodium chloride flush  3 mL Intravenous Q12H   Continuous Infusions: . sodium chloride 75 mL/hr (03/08/16 0648)   PRN Meds:.acetaminophen **OR** acetaminophen, morphine injection, ondansetron **OR** ondansetron (ZOFRAN) IV, oxyCODONE-acetaminophen  Micro Results Recent Results (from the past 240 hour(s))  Culture, blood (routine x 2)     Status: Abnormal   Collection Time: 03/05/16  6:30 PM  Result Value Ref Range Status   Specimen Description BLOOD RIGHT ANTECUBITAL  Final   Special Requests BOTTLES DRAWN AEROBIC AND ANAEROBIC 5CC  Final   Culture  Setup Time   Final    GRAM POSITIVE  COCCI IN CLUSTERS ANAEROBIC BOTTLE ONLY CRITICAL RESULT CALLED TO, READ BACK BY AND VERIFIED WITH: C ROBERTSON PHARMD 1818 03/06/16 A BROWNING    Culture (A)  Final    STAPHYLOCOCCUS SPECIES (COAGULASE NEGATIVE) THE SIGNIFICANCE OF ISOLATING THIS ORGANISM FROM A SINGLE SET OF BLOOD CULTURES WHEN MULTIPLE SETS ARE DRAWN IS UNCERTAIN. PLEASE NOTIFY THE MICROBIOLOGY DEPARTMENT WITHIN ONE WEEK IF SPECIATION AND SENSITIVITIES ARE REQUIRED.    Report Status 03/08/2016 FINAL  Final  Blood Culture ID Panel (Reflexed)     Status: Abnormal   Collection Time: 03/05/16  6:30 PM  Result Value Ref Range Status   Enterococcus species NOT DETECTED NOT DETECTED Final   Vancomycin resistance NOT DETECTED NOT DETECTED Final   Listeria monocytogenes NOT DETECTED NOT DETECTED Final  Staphylococcus species DETECTED (A) NOT DETECTED Final    Comment: CRITICAL RESULT CALLED TO, READ BACK BY AND VERIFIED WITH: C ROBERTSON PHARMD 1818 03/06/16 A BROWNING    Staphylococcus aureus NOT DETECTED NOT DETECTED Final   Methicillin resistance DETECTED (A) NOT DETECTED Final    Comment: CRITICAL RESULT CALLED TO, READ BACK BY AND VERIFIED WITH: C ROBERTSON PHARMD 1818 03/06/16 A BROWNING    Streptococcus species NOT DETECTED NOT DETECTED Final   Streptococcus agalactiae NOT DETECTED NOT DETECTED Final   Streptococcus pneumoniae NOT DETECTED NOT DETECTED Final   Streptococcus pyogenes NOT DETECTED NOT DETECTED Final   Acinetobacter baumannii NOT DETECTED NOT DETECTED Final   Enterobacteriaceae species NOT DETECTED NOT DETECTED Final   Enterobacter cloacae complex NOT DETECTED NOT DETECTED Final   Escherichia coli NOT DETECTED NOT DETECTED Final   Klebsiella oxytoca NOT DETECTED NOT DETECTED Final   Klebsiella pneumoniae NOT DETECTED NOT DETECTED Final   Proteus species NOT DETECTED NOT DETECTED Final   Serratia marcescens NOT DETECTED NOT DETECTED Final   Carbapenem resistance NOT DETECTED NOT DETECTED Final    Haemophilus influenzae NOT DETECTED NOT DETECTED Final   Neisseria meningitidis NOT DETECTED NOT DETECTED Final   Pseudomonas aeruginosa NOT DETECTED NOT DETECTED Final   Candida albicans NOT DETECTED NOT DETECTED Final   Candida glabrata NOT DETECTED NOT DETECTED Final   Candida krusei NOT DETECTED NOT DETECTED Final   Candida parapsilosis NOT DETECTED NOT DETECTED Final   Candida tropicalis NOT DETECTED NOT DETECTED Final  Culture, blood (Routine X 2) w Reflex to ID Panel     Status: None (Preliminary result)   Collection Time: 03/05/16  8:35 PM  Result Value Ref Range Status   Specimen Description BLOOD RIGHT HAND  Final   Special Requests IN PEDIATRIC BOTTLE 2CC  Final   Culture NO GROWTH 2 DAYS  Final   Report Status PENDING  Incomplete  Urine culture     Status: None   Collection Time: 03/05/16 11:27 PM  Result Value Ref Range Status   Specimen Description URINE, CATHETERIZED  Final   Special Requests Normal  Final   Culture NO GROWTH  Final   Report Status 03/07/2016 FINAL  Final    Radiology Reports Dg Chest 2 View  03/05/2016  CLINICAL DATA:  Found lying on the floor with bright red bleeding from rectum. Fell yesterday. EXAM: CHEST  2 VIEW COMPARISON:  03/29/2015 FINDINGS: The lungs are clear without airspace disease or pulmonary edema. Heart and mediastinum are within normal limits. Negative for a pneumothorax. Lateral view is limited because the arms are not elevated. No large pleural effusions. Bony thorax is grossly intact. Degenerative changes at the right [facility] joint. IMPRESSION: No acute chest abnormality. Electronically Signed   By: Markus Daft M.D.   On: 03/05/2016 19:57   Ct Head Wo Contrast  03/05/2016  CLINICAL DATA:  Unwitnessed fall. EXAM: CT HEAD WITHOUT CONTRAST CT CERVICAL SPINE WITHOUT CONTRAST TECHNIQUE: Multidetector CT imaging of the head and cervical spine was performed following the standard protocol without intravenous contrast. Multiplanar CT image  reconstructions of the cervical spine were also generated. COMPARISON:  03/29/2015 head and cervical spine CT. FINDINGS: CT HEAD FINDINGS No evidence of parenchymal hemorrhage or extra-axial fluid collection. No mass lesion, mass effect, or midline shift. No CT evidence of acute infarction. Mild intracranial atherosclerosis. Nonspecific moderate subcortical and periventricular white matter hypodensity, most in keeping with chronic small vessel ischemic change. Generalized cerebral volume loss. Cerebral ventricle sizes  are stable and concordant with the degree of cerebral volume loss. Mucoperiosteal thickening throughout the visualized left maxillary sinus with associated small fluid level. Mucoperiosteal thickening throughout the dependent frontal sinus, sphenoid sinus and bilateral ethmoidal air cells. Small right greater than left mastoid effusions. No evidence of calvarial fracture. CT CERVICAL SPINE FINDINGS No fracture is detected in the cervical spine. 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 No prevertebral soft tissue swelling. Normal cervical lordosis. Dens is well positioned between the lateral masses of C1. No acute facet subluxation. Marked degenerative disc disease throughout the cervical spine, most prominent at C6-7 with bulky anterior marginal osteophytes at C3-4. Posterior disc bulge at C3-4 effaces the anterior thecal sac with no definite cord flattening, unchanged. Severe bilateral facet arthropathy. Moderate to severe foraminal stenosis on the left at C3-4. Severe bilateral foraminal stenosis at C4-5. Stable 4 mm anterolisthesis at C6-7. No acute cervical spine subluxation. Small bilateral inferior mastoid effusions. No evidence of intra-axial hemorrhage in the visualized brain. No gross cervical canal hematoma. No significant pulmonary nodules at the visualized lung apices. No cervical adenopathy or other  significant neck soft tissue abnormality. IMPRESSION: 1. No evidence of acute intracranial abnormality. No evidence of calvarial fracture. 2. Generalized cerebral volume loss and moderate chronic small vessel ischemia. 3. No cervical spine fracture or acute malalignment. 4. 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 Stable 4 mm anterolisthesis at C6-7. 5. Severe degenerative changes in the cervical spine as described. 6. Paranasal sinusitis, possibly acute. 7. Small bilateral mastoid effusions. Electronically Signed   By: Ilona Sorrel M.D.   On: 03/05/2016 20:47   Ct Cervical Spine Wo Contrast  03/05/2016  CLINICAL DATA:  Unwitnessed fall. EXAM: CT HEAD WITHOUT CONTRAST CT CERVICAL SPINE WITHOUT CONTRAST TECHNIQUE: Multidetector CT imaging of the head and cervical spine was performed following the standard protocol without intravenous contrast. Multiplanar CT image reconstructions of the cervical spine were also generated. COMPARISON:  03/29/2015 head and cervical spine CT. FINDINGS: CT HEAD FINDINGS No evidence of parenchymal hemorrhage or extra-axial fluid collection. No mass lesion, mass effect, or midline shift. No CT evidence of acute infarction. Mild intracranial atherosclerosis. Nonspecific moderate subcortical and periventricular white matter hypodensity, most in keeping with chronic small vessel ischemic change. Generalized cerebral volume loss. Cerebral ventricle sizes are stable and concordant with the degree of cerebral volume loss. Mucoperiosteal thickening throughout the visualized left maxillary sinus with associated small fluid level. Mucoperiosteal thickening throughout the dependent frontal sinus, sphenoid sinus and bilateral ethmoidal air cells. Small right greater than left mastoid effusions. No evidence of calvarial fracture. CT CERVICAL SPINE FINDINGS No fracture is detected in the cervical spine. Re- demonstrated are hypoplastic pedicles, incomplete posterior  neural arch fusion and chronic bilateral pars defects at C6. No prevertebral soft tissue swelling. Normal cervical lordosis. Dens is well positioned between the lateral masses of C1. No acute facet subluxation. Marked degenerative disc disease throughout the cervical spine, most prominent at C6-7 with bulky anterior marginal osteophytes at C3-4. Posterior disc bulge at C3-4 effaces the anterior thecal sac with no definite cord flattening, unchanged. Severe bilateral facet arthropathy. Moderate to severe foraminal stenosis on the left at C3-4. Severe bilateral foraminal stenosis at C4-5. Stable 4 mm anterolisthesis at C6-7. No acute cervical spine subluxation. Small bilateral inferior mastoid effusions. No evidence of intra-axial hemorrhage in the visualized brain. No gross cervical canal hematoma. No significant pulmonary nodules at the visualized  lung apices. No cervical adenopathy or other significant neck soft tissue abnormality. IMPRESSION: 1. No evidence of acute intracranial abnormality. No evidence of calvarial fracture. 2. Generalized cerebral volume loss and moderate chronic small vessel ischemia. 3. No cervical spine fracture or acute malalignment. 4. 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 Stable 4 mm anterolisthesis at C6-7. 5. Severe degenerative changes in the cervical spine as described. 6. Paranasal sinusitis, possibly acute. 7. Small bilateral mastoid effusions. Electronically Signed   By: Ilona Sorrel M.D.   On: 03/05/2016 20:47   Dg Humerus Left  03/05/2016  CLINICAL DATA:  [place] yesterday.  Posterior elbow pain. EXAM: LEFT HUMERUS - 2+ VIEW COMPARISON:  None. FINDINGS: The left humerus appears intact. No evidence of acute fracture or dislocation. No focal bone lesion or bone destruction. Soft tissues are unremarkable. Note that the elbow is not optimally evaluated on humeral films. If there is suspicion of elbow injury, elbow series should be obtained.  IMPRESSION: Negative. Electronically Signed   By: Lucienne Capers M.D.   On: 03/05/2016 23:58    Time Spent in minutes  25   Louellen Molder M.D on 03/08/2016 at 2:08 PM  Between 7am to 7pm - Pager - [phone]  After 7pm go to [email or web address] - password [facility]  [facility] -  Office  [phone]

## 2016-03-08 NOTE — Plan of Care (Signed)
Problem: Skin Integrity: Goal: Risk for impaired skin integrity will decrease Outcome: Progressing Foam drrssing placed to L buttocks.MSAD noted with DTI to upper buttocks.

## 2016-03-08 NOTE — Evaluation (Signed)
Physical Therapy Evaluation Patient Details Name: Dale George MRN: [id number] DOB: 1946/09/11 Today's Date: 03/08/2016   History of Present Illness  Pt is a 70 y/o M who presents after fall at home.  Pt c/o Lt arm pain and rectal bleeding.  Pt was on floor for 24 hrs.  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    Clinical Impression  Pt admitted with above diagnosis. Pt currently with functional limitations due to the deficits listed below (see PT Problem List). Dale George presents w/ generalized weakness, pain, impaired balance, and poor activity tolerance.  His HR reaches 156 sitting EOB after bed mobility.  He is unable to reposition himself and will need assist to reposition q2h.  Pt is from home where he lives alone.  He will SNF level of care at d/c.  Pt will benefit from skilled PT to increase their independence and safety with mobility to allow discharge to the venue listed below.      Follow Up Recommendations SNF;Supervision/Assistance - 24 hour    Equipment Recommendations  Other (comment) (TBD at next venue of care)    Recommendations for Other Services Speech consult     Precautions / Restrictions Precautions Precautions: Fall Precaution Comments: monitor HR Restrictions Weight Bearing Restrictions: No      Mobility  Bed Mobility Overal bed mobility: Needs Assistance Bed Mobility: Supine to Sit;Sit to Supine     Supine to sit: Mod assist;HOB elevated Sit to supine: Max assist   General bed mobility comments: Assist to elevate trunk and to manage Bil LEs w/ HOB elevated.    Transfers                 General transfer comment: Deferred as pt's HR up to 156 sitting EOB.   Ambulation/Gait                Stairs            Wheelchair Mobility    Modified Rankin (Stroke Patients Only)       Balance Overall balance assessment: Needs assistance Sitting-balance support: Single  extremity supported;Feet supported Sitting balance-Leahy Scale: Poor Sitting balance - Comments: Relies on at least 1 UE supported while sitting EOB.                                       Pertinent Vitals/Pain Pain Assessment: Faces Faces Pain Scale: Hurts even more Pain Location: Lt UE and Rt LE Pain Descriptors / Indicators: Aching;Discomfort;Grimacing;Guarding Pain Intervention(s): Limited activity within patient's tolerance;Monitored during session;Repositioned;Patient requesting pain meds-RN notified    Home Living Family/patient expects to be discharged to:: Skilled nursing facility Living Arrangements: Alone Available Help at Discharge: Family;Available PRN/intermittently Type of Home: Apartment Home Access: Stairs to enter Entrance Stairs-Rails: Chemical engineer of Steps: 4 Home Layout: One level Home Equipment: Walker - 2 wheels      Prior Function Level of Independence: Needs assistance   Gait / Transfers Assistance Needed: Not using AD PTA.  ADL's / Homemaking Assistance Needed: Would shower only when family present but could complete independently.         Hand Dominance        Extremity/Trunk Assessment   Upper Extremity Assessment: LUE deficits/detail       LUE Deficits / Details: Swelling Lt UE and hand and pt guarding.     Lower  Extremity Assessment: Generalized weakness (see general notes below regarding Rt LE pain)         Communication   Communication: No difficulties  Cognition Arousal/Alertness: Awake/alert Behavior During Therapy: WFL for tasks assessed/performed Overall Cognitive Status: History of cognitive impairments - at baseline                      General Comments General comments (skin integrity, edema, etc.): Pt c/o lateral Rt LE pain above knee.  Upon entering room pt rotated to his Lt.  Additionally, pt was on the floor for ~24 hrs lying on his Lt side.  Suspect that lateral Rt LE  pain may be due to tension from positioning.  Notified RN that pt is unable to reposition himself in bed and that he will need to be rotated q2h.  After taking a sip of water pt appears to be unable to swallow and water begins dripping out of his mouth and nose, pt requests for towel to wipe it clean.  Daughter reports this has been a problem PTA.  Recommending Speech swallow evaluation, RN notified.      Exercises Other Exercises Other Exercises: Pt encouraged to continue w/ opening/closing Lt hand and keeping Lt UE elevated due to swelling.      Assessment/Plan    PT Assessment Patient needs continued PT services  PT Diagnosis Difficulty walking;Generalized weakness;Acute pain   PT Problem List Decreased strength;Decreased activity tolerance;Decreased balance;Decreased mobility;Decreased range of motion;Decreased cognition;Decreased knowledge of use of DME;Decreased safety awareness;Cardiopulmonary status limiting activity;Pain;Obesity  PT Treatment Interventions DME instruction;Gait training;Functional mobility training;Therapeutic activities;Therapeutic exercise;Balance training;Cognitive remediation;Patient/family education   PT Goals (Current goals can be found in the Care Plan section) Acute Rehab PT Goals Patient Stated Goal: to feel better and get stronger PT Goal Formulation: With patient/family Time For Goal Achievement: 03/22/16 Potential to Achieve Goals: Good    Frequency Min 2X/week   Barriers to discharge Inaccessible home environment;Decreased caregiver support Lives alone w/ steps to enter home    Co-evaluation               End of Session   Activity Tolerance: Patient limited by fatigue;Patient limited by pain;Treatment limited secondary to medical complications (Comment) (elevated HR) Patient left: in bed;with call bell/phone within reach;with bed alarm set Nurse Communication: Other (comment);Mobility status;Need for lift equipment (rotated q2h; need for  speech evaluation; HR)         Time: [id number] PT Time Calculation (min) (ACUTE ONLY): 34 min   Charges:   PT Evaluation $PT Eval High Complexity: 1 Procedure PT Treatments $Therapeutic Activity: 8-22 mins   PT G Codes:       Collie Siad PT, DPT  Pager: [phone] Phone: [phone] 03/08/2016, 2:53 PM

## 2016-03-09 ENCOUNTER — Other Ambulatory Visit: Payer: Self-pay

## 2016-03-09 ENCOUNTER — Encounter (HOSPITAL_COMMUNITY): Payer: Self-pay | Admitting: Radiology

## 2016-03-09 ENCOUNTER — Inpatient Hospital Stay (HOSPITAL_COMMUNITY): Payer: Medicare PPO

## 2016-03-09 DIAGNOSIS — R000 Tachycardia, unspecified: Secondary | ICD-10-CM

## 2016-03-09 DIAGNOSIS — R0682 Tachypnea, not elsewhere classified: Secondary | ICD-10-CM

## 2016-03-09 DIAGNOSIS — R7989 Other specified abnormal findings of blood chemistry: Secondary | ICD-10-CM

## 2016-03-09 LAB — CBC
HCT: 42.7 % (ref 39.0–52.0)
Hemoglobin: 13.7 g/dL (ref 13.0–17.0)
MCH: 26.5 pg (ref 26.0–34.0)
MCHC: 32.1 g/dL (ref 30.0–36.0)
MCV: 82.6 fL (ref 78.0–100.0)
PLATELETS: 72 10*3/uL — AB (ref 150–400)
RBC: 5.17 MIL/uL (ref 4.22–5.81)
RDW: 15.3 % (ref 11.5–15.5)
WBC: 113.6 10*3/uL (ref 4.0–10.5)

## 2016-03-09 LAB — BASIC METABOLIC PANEL
Anion gap: 9 (ref 5–15)
BUN: 15 mg/dL (ref 6–20)
CHLORIDE: 105 mmol/L (ref 101–111)
CO2: 23 mmol/L (ref 22–32)
CREATININE: 1.02 mg/dL (ref 0.61–1.24)
Calcium: 8.2 mg/dL — ABNORMAL LOW (ref 8.9–10.3)
GFR calc non Af Amer: >60 mL/min (ref >60)
GLUCOSE: 127 mg/dL — AB (ref 65–99)
Potassium: 4.6 mmol/L (ref 3.5–5.1)
Sodium: 137 mmol/L (ref 135–145)

## 2016-03-09 LAB — CK: Total CK: 732 U/L — ABNORMAL HIGH (ref 49–397)

## 2016-03-09 LAB — TROPONIN I
TROPONIN I: 0.12 ng/mL — AB (ref <0.031)
Troponin I: 0.08 ng/mL — ABNORMAL HIGH (ref <0.031)

## 2016-03-09 LAB — D-DIMER, QUANTITATIVE: D-Dimer, Quant: 12.34 ug/mL-FEU — ABNORMAL HIGH (ref 0.00–0.50)

## 2016-03-09 MED ORDER — METOPROLOL TARTRATE 25 MG PO TABS
25.0000 mg | ORAL_TABLET | Freq: Two times a day (BID) | ORAL | Status: DC
  Administered 2016-03-09: 25 mg via ORAL
  Filled 2016-03-09: qty 1

## 2016-03-09 MED ORDER — IOPAMIDOL (ISOVUE-370) INJECTION 76%
INTRAVENOUS | Status: AC
  Filled 2016-03-09: qty 100

## 2016-03-09 MED ORDER — FUROSEMIDE 10 MG/ML IJ SOLN
INTRAMUSCULAR | Status: AC
  Filled 2016-03-09: qty 4

## 2016-03-09 MED ORDER — NITROGLYCERIN 0.4 MG SL SUBL: 0.4000 mg | SUBLINGUAL_TABLET | SUBLINGUAL | Status: DC | PRN

## 2016-03-09 MED ORDER — IOPAMIDOL (ISOVUE-370) INJECTION 76%
INTRAVENOUS | Status: AC
  Administered 2016-03-09: 80 mL
  Filled 2016-03-09: qty 100

## 2016-03-09 MED ORDER — METOPROLOL TARTRATE 5 MG/5ML IV SOLN
5.0000 mg | Freq: Once | INTRAVENOUS | Status: AC
  Administered 2016-03-09: 5 mg via INTRAVENOUS
  Filled 2016-03-09: qty 5

## 2016-03-09 MED ORDER — FUROSEMIDE 10 MG/ML IJ SOLN
40.0000 mg | Freq: Once | INTRAMUSCULAR | Status: AC
  Administered 2016-03-09: 40 mg via INTRAVENOUS

## 2016-03-09 MED ORDER — HEPARIN (PORCINE) IN NACL 100-0.45 UNIT/ML-% IJ SOLN
2900.0000 [IU]/h | INTRAMUSCULAR | Status: DC
  Administered 2016-03-09: 1800 [IU]/h via INTRAVENOUS
  Administered 2016-03-10: 2400 [IU]/h via INTRAVENOUS
  Administered 2016-03-10: 2100 [IU]/h via INTRAVENOUS
  Administered 2016-03-11 (×2): 2700 [IU]/h via INTRAVENOUS
  Filled 2016-03-09 (×7): qty 250

## 2016-03-09 NOTE — Progress Notes (Signed)
ANTICOAGULATION CONSULT NOTE - Initial Consult  Pharmacy Consult for Heparin Indication: pulmonary embolus  No Known Allergies  Patient Measurements: Height: 6\' 6"  (198.1 cm) Weight: 290 lb 8 oz (131.77 kg) IBW/kg (Calculated) : 91.4 Heparin Dosing Weight: 119.5 kg  Vital Signs: Temp: 97.4 F (36.3 C) (06/18 1950) Temp Source: Oral (06/18 1950) BP: 127/93 mmHg (06/18 1950) Pulse Rate: 118 (06/18 1950)  Labs:  Recent Labs  03/07/16 0816 03/08/16 0540 03/09/16 1532  HGB 13.6 13.9 13.7  HCT 43.0 43.0 42.7  PLT 70* 82* 72*  CREATININE 1.07 1.00 1.02  CKTOTAL 769*  --   --   TROPONINI  --   --  0.12*    Estimated Creatinine Clearance: 102.6 mL/min (by C-G formula based on Cr of 1.02).   Medical History: Past Medical History  Diagnosis Date  . Cancer (HCC)     tongue cancer  . Hypertension   . Hyperlipidemia   . Lymphocytosis 12/14/2013  . Fatigue 12/14/2013  . CLL (chronic lymphocytic leukemia) ([place]) 12/23/2013  . Gout   . Insomnia   . Chronic kidney disease     Stage 3  . Dementia   . Ataxia     Assessment: 61 YOM with hx CLL who presented on 6/15 s/p fall with left arm pain, rhabdo, and rectal bleeding. Rectal bleeding since resolved. The patient was noted to have SOB and increased WOB with CTA on 6/18 showing PE. Pharmacy has been consulted to start heparin for anticoagulation.   Head CT was negative for bleed on admit. The patient has chronic thrombocytopenia (plts 72). Given this - will hold off on bolus. Hep Wt: 119 kg  Goal of Therapy:  Heparin level 0.3-0.7 units/ml Monitor platelets by anticoagulation protocol: Yes   Plan:  1. Start heparin at 1800 units/hr (18 ml/hr) 2. Daily heparin level and CBC 3. Will continue to monitor for any signs/symptoms of bleeding and will follow up with heparin level in 8 hours   Alycia Rossetti, PharmD, BCPS Clinical Pharmacist Pager: [phone] 03/09/2016 10:12 PM

## 2016-03-09 NOTE — Progress Notes (Signed)
   03/09/16 1323  Vitals  Temp 97.7 F (36.5 C)  Temp Source Oral  BP (!) 133/96 mmHg  BP Location Right Arm  BP Method Automatic  Patient Position (if appropriate) Lying  Pulse Rate 84  Pulse Rate Source Dinamap  ECG Heart Rate (!) 135  Resp (!) 40  Oxygen Therapy  SpO2 96 %  O2 Device Room Air  Patient c/o feeling short of breath. Respirations appeared labored with rate 40. Breath sounds coarse. L arm 2 + edema and red at inner elbow. MD notified. Orders received. Will continue to monitor. Bartholomew Crews, RN

## 2016-03-09 NOTE — Progress Notes (Signed)
PROGRESS NOTE                                                                                                                                                                                                             Patient Demographics:    Dale George, is a 70 y.o. male, DOB - 1945/12/18, TD:[id number]  Admit date - 03/05/2016   Admitting Physician Ivor Costa, MD  Outpatient Primary MD for the patient is Vena Austria, MD  LOS - 4  Outpatient Specialists: Oncology (Dr. Alvy Bimler) Neurology (Dr. Rexene Alberts)  Chief Complaint  Patient presents with  . 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 Patient called EMS who found him on the floor and complained of left arm pain. He was also found to have about 200 mL bright red blood per rectum. In the ED he had WBC of 32K, temperature 99.8 F, normal hemoglobin, platelets of 101, lactate of 2.3 and CK of 2500. He was tachycardic and tachypnea with potassium of 7.5 (repeated of 4.7). UA and chest x-ray unremarkable. Head CT and cervical spine negative for acute fracture or abnormality. Patient admitted for acute rhabdomyolysis .   Subjective:   Patient became tachycardic and tachypnea during the day. Also noted to have increased swelling in his left upper extremity. Denies any chest pain. Complained of pain in his legs.   Assessment  & Plan :    Principal Problem: Sepsis with Acute rhabdomyolysis Secondary to fall and possible dehydration.no signs of infection. CPK improving with IV fluids. (2500-->750). Statin discontinued. Seen by PT and recommend skilled nursing facility.  Active Problems:  Tachycardia with tachypnea Heart rate  went up to 150s. Chest x-ray showing cardiomegaly with mild pulmonary vascular congestion. Ordered 40 mg IV Lasix. EKG shows sinus tachycardia with LAFB (unchanged, no ST-T changes). Mildly elevated troponin. We'll cycle enzymes. Check d-dimer. If positive will need CT angiogram of the chest to rule out for PE. Sublingual nitrate when necessary. Added metoprolol. Continue telemetry monitor. Check 2-D echo.   elevated troponin Chest pain symptoms. No ST-T changes on EKG except for sinus tachycardia. We'll cycle enzymes. Continue  sublingual nitrate and metoprolol. Did not add aspirin due to chronic trauma cytopenia. If symptoms persist with further worsening troponin will need cardiology consult.  Upper extremity swelling with warmth Noted since 6/18. Mild tenderness. Check Doppler of the left upper extremities. Check uric acid.     Acute on chronic kidney injury stage III Mild. Resolved with IV fluids.  Generalized weakness with frequent falls Seen by PT and recommend skilled nursing facility. Daughter concerned that patient cannot go back home and live by himself. [facility] for short-term rehabilitation with possible long-term skilled care thereafter.  Multiple wounds with skin abrasion in bilateral feet knee, hip and flank Appreciate wound care evaluation.  Chronic thrombocytopenia Stable.     CLL (chronic lymphocytic leukemia) (HCC) Chronically elevated WBC. Follows with Dr. Alvy Bimler    Thrombocytopenia [facility]) Chronic     Rectal bleeding No episodes while in the hospital. H&H stable.         Code Status : DO NOT RESUSCITATE (confirmed with daughter)  Family Communication  : Spoke with daughter Caryl Pina on the phone  Disposition Plan  : SNF possibly on 6/19 or 6/20  Barriers For Discharge : Tachycardia, elevated troponin.  Consults  :  none  Procedures  : CT head and cervical spine  DVT Prophylaxis  : scd  Lab Results  Component Value Date   PLT PENDING 03/09/2016     Antibiotics  :    Anti-infectives    Start     Dose/Rate Route Frequency Ordered Stop   03/06/16 1200  vancomycin (VANCOCIN) 1,250 mg in sodium chloride 0.9 % 250 mL IVPB  Status:  Discontinued     1,250 mg 166.7 mL/hr over 90 Minutes Intravenous Every 12 hours 03/05/16 2317 03/06/16 1059   03/06/16 0600  piperacillin-tazobactam (ZOSYN) IVPB 3.375 g  Status:  Discontinued     3.375 g 12.5 mL/hr over 240 Minutes Intravenous Every 8 hours 03/05/16 2317 03/06/16 1059   03/05/16 2330  piperacillin-tazobactam (ZOSYN) IVPB 3.375 g     3.375 g 100 mL/hr over 30 Minutes Intravenous STAT 03/05/16 2317 03/06/16 0033   03/05/16 2330  vancomycin (VANCOCIN) 2,500 mg in sodium chloride 0.9 % 500 mL IVPB  Status:  Discontinued     2,500 mg 250 mL/hr over 120 Minutes Intravenous Every 12 hours 03/05/16 2317 03/05/16 2318   03/05/16 2330  vancomycin (VANCOCIN) 2,500 mg in sodium chloride 0.9 % 500 mL IVPB     2,500 mg 250 mL/hr over 120 Minutes Intravenous  Once 03/05/16 2318 03/06/16 0203        Objective:   Filed Vitals:   03/08/16 1827 03/08/16 2113 03/09/16 0728 03/09/16 1323  BP: 110/58 144/80 140/74 133/96  Pulse: 71 77 80 84  Temp: 97.4 F (36.3 C) 98.4 F (36.9 C) 98.7 F (37.1 C) 97.7 F (36.5 C)  TempSrc: Oral Oral Oral Oral  Resp: 18 17 18  40  Height:      Weight:      SpO2: 96% 94% 96% 96%    Wt Readings from Last 3 Encounters:  03/07/16 131.77 kg (290 lb 8 oz)  12/20/15 150.141 kg (331 lb)  06/26/15 154.631 kg (340 lb 14.4 oz)     Intake/Output Summary (Last 24 hours) at 03/09/16 1646 Last data filed at 03/09/16 1423  Gross per 24 hour  Intake   2043 ml  Output    452 ml  Net   1591 ml     Physical Exam  [id number] fatigued, in some  distress HEENT: moist mucosa, supple neck Chest: clear b/l, no added sounds CVS: Swan and S2 tachycardic, no murmurs rub or gallop GI: soft, NT, ND, BS+ Musculoskeletal: warm, swelling of the left upper extremity with  erythema and tenderness CNS: Alert and oriented, nonfocal    Data Review:    CBC  Recent Labs Lab 03/05/16 1909 03/05/16 2221 03/06/16 0112 03/07/16 0816 03/08/16 0540 03/09/16 1532  WBC 132.2*  --  127.4* 93.3* 98.1* 113.6*  HGB 15.8 17.3* 15.0 13.6 13.9 13.7  HCT 49.0 51.0 47.1 43.0 43.0 42.7  PLT 101*  --  104* 70* 82* PENDING  MCV 82.6  --  84.3 83.7 82.4 82.6  MCH 26.6  --  26.8 26.5 26.6 26.5  MCHC 32.2  --  31.8 31.6 32.3 32.1  RDW 15.1  --  15.5 15.7* 15.7* 15.3  LYMPHSABS 117.7*  --   --  62.5* 87.3*  --   MONOABS 2.6*  --   --  0.0* 2.0*  --   EOSABS 0.0  --   --  0.0 0.0  --   BASOSABS 0.0  --   --  0.0 0.0  --     Chemistries   Recent Labs Lab 03/05/16 1909 03/05/16 2054  03/06/16 0724 03/06/16 1549 03/07/16 0816 03/08/16 0540 03/09/16 1532  NA 132* 132*  < > 136 138 141 135 137  K >7.5* 7.4*  < > 6.1* 5.5* 5.3* 4.5 4.6  CL 102 104  < > 105 110 111 103 105  CO2 21* 20*  < > 22 24 22  21* 23  GLUCOSE 136* 122*  < > 105* 108* 93 100* 127*  BUN 19 21*  < > 19 22* 16 13 15   CREATININE 1.33* 1.38*  < > 1.15 1.41* 1.07 1.00 1.02  CALCIUM 8.7* 8.7*  < > 7.8* 8.1* 8.4* 8.1* 8.2*  AST 95* 96*  --   --   --   --   --   --   ALT 55 53  --   --   --   --   --   --   ALKPHOS 95 93  --   --   --   --   --   --   BILITOT 1.7* 1.8*  --   --   --   --   --   --   < > = values in this interval not displayed. ------------------------------------------------------------------------------------------------------------------ No results for input(s): CHOL, HDL, LDLCALC, TRIG, CHOLHDL, LDLDIRECT in the last 72 hours.  No results found for: HGBA1C ------------------------------------------------------------------------------------------------------------------ No results for input(s): TSH, T4TOTAL, T3FREE, THYROIDAB in the last 72 hours.  Invalid input(s):  FREET3 ------------------------------------------------------------------------------------------------------------------ No results for input(s): VITAMINB12, FOLATE, FERRITIN, TIBC, IRON, RETICCTPCT in the last 72 hours.  Coagulation profile  Recent Labs Lab 03/06/16 0112  INR 1.41    No results for input(s): DDIMER in the last 72 hours.  Cardiac Enzymes  Recent Labs Lab 03/09/16 1532  TROPONINI 0.12*   ------------------------------------------------------------------------------------------------------------------ No results found for: BNP  Inpatient Medications  Scheduled Meds: . furosemide      . nystatin ointment   Topical BID  . sodium chloride flush  3 mL Intravenous Q12H   Continuous Infusions:   PRN Meds:.acetaminophen **OR** acetaminophen, morphine injection, ondansetron **OR** ondansetron (ZOFRAN) IV, oxyCODONE-acetaminophen  Micro Results Recent Results (from the past 240 hour(s))  Culture, blood (routine x 2)     Status: Abnormal   Collection Time: 03/05/16  6:30 PM  Result Value Ref Range Status   Specimen Description BLOOD RIGHT ANTECUBITAL  Final   Special Requests BOTTLES DRAWN AEROBIC AND ANAEROBIC 5CC  Final   Culture  Setup Time   Final    GRAM POSITIVE COCCI IN CLUSTERS ANAEROBIC BOTTLE ONLY CRITICAL RESULT CALLED TO, READ BACK BY AND VERIFIED WITH: C ROBERTSON PHARMD 1818 03/06/16 A BROWNING    Culture (A)  Final    STAPHYLOCOCCUS SPECIES (COAGULASE NEGATIVE) THE SIGNIFICANCE OF ISOLATING THIS ORGANISM FROM A SINGLE SET OF BLOOD CULTURES WHEN MULTIPLE SETS ARE DRAWN IS UNCERTAIN. PLEASE NOTIFY THE MICROBIOLOGY DEPARTMENT WITHIN ONE WEEK IF SPECIATION AND SENSITIVITIES ARE REQUIRED.    Report Status 03/08/2016 FINAL  Final  Blood Culture ID Panel (Reflexed)     Status: Abnormal   Collection Time: 03/05/16  6:30 PM  Result Value Ref Range Status   Enterococcus species NOT DETECTED NOT DETECTED Final   Vancomycin resistance NOT DETECTED NOT  DETECTED Final   Listeria monocytogenes NOT DETECTED NOT DETECTED Final   Staphylococcus species DETECTED (A) NOT DETECTED Final    Comment: CRITICAL RESULT CALLED TO, READ BACK BY AND VERIFIED WITH: C ROBERTSON PHARMD 1818 03/06/16 A BROWNING    Staphylococcus aureus NOT DETECTED NOT DETECTED Final   Methicillin resistance DETECTED (A) NOT DETECTED Final    Comment: CRITICAL RESULT CALLED TO, READ BACK BY AND VERIFIED WITH: C ROBERTSON PHARMD 1818 03/06/16 A BROWNING    Streptococcus species NOT DETECTED NOT DETECTED Final   Streptococcus agalactiae NOT DETECTED NOT DETECTED Final   Streptococcus pneumoniae NOT DETECTED NOT DETECTED Final   Streptococcus pyogenes NOT DETECTED NOT DETECTED Final   Acinetobacter baumannii NOT DETECTED NOT DETECTED Final   Enterobacteriaceae species NOT DETECTED NOT DETECTED Final   Enterobacter cloacae complex NOT DETECTED NOT DETECTED Final   Escherichia coli NOT DETECTED NOT DETECTED Final   Klebsiella oxytoca NOT DETECTED NOT DETECTED Final   Klebsiella pneumoniae NOT DETECTED NOT DETECTED Final   Proteus species NOT DETECTED NOT DETECTED Final   Serratia marcescens NOT DETECTED NOT DETECTED Final   Carbapenem resistance NOT DETECTED NOT DETECTED Final   Haemophilus influenzae NOT DETECTED NOT DETECTED Final   Neisseria meningitidis NOT DETECTED NOT DETECTED Final   Pseudomonas aeruginosa NOT DETECTED NOT DETECTED Final   Candida albicans NOT DETECTED NOT DETECTED Final   Candida glabrata NOT DETECTED NOT DETECTED Final   Candida krusei NOT DETECTED NOT DETECTED Final   Candida parapsilosis NOT DETECTED NOT DETECTED Final   Candida tropicalis NOT DETECTED NOT DETECTED Final  Culture, blood (Routine X 2) w Reflex to ID Panel     Status: None (Preliminary result)   Collection Time: 03/05/16  8:35 PM  Result Value Ref Range Status   Specimen Description BLOOD RIGHT HAND  Final   Special Requests IN PEDIATRIC BOTTLE 2CC  Final   Culture NO GROWTH 3  DAYS  Final   Report Status PENDING  Incomplete  Urine culture     Status: None   Collection Time: 03/05/16 11:27 PM  Result Value Ref Range Status   Specimen Description URINE, CATHETERIZED  Final   Special Requests Normal  Final   Culture NO GROWTH  Final   Report Status 03/07/2016 FINAL  Final    Radiology Reports Dg Chest 2 View  03/05/2016  CLINICAL DATA:  Found lying on the floor with bright red bleeding from rectum. Fell yesterday. EXAM: CHEST  2 VIEW COMPARISON:  03/29/2015 FINDINGS: The lungs are clear without airspace disease  or pulmonary edema. Heart and mediastinum are within normal limits. Negative for a pneumothorax. Lateral view is limited because the arms are not elevated. No large pleural effusions. Bony thorax is grossly intact. Degenerative changes at the right [facility] joint. IMPRESSION: No acute chest abnormality. Electronically Signed   By: Markus Daft M.D.   On: 03/05/2016 19:57   Ct Head Wo Contrast  03/05/2016  CLINICAL DATA:  Unwitnessed fall. EXAM: CT HEAD WITHOUT CONTRAST CT CERVICAL SPINE WITHOUT CONTRAST TECHNIQUE: Multidetector CT imaging of the head and cervical spine was performed following the standard protocol without intravenous contrast. Multiplanar CT image reconstructions of the cervical spine were also generated. COMPARISON:  03/29/2015 head and cervical spine CT. FINDINGS: CT HEAD FINDINGS No evidence of parenchymal hemorrhage or extra-axial fluid collection. No mass lesion, mass effect, or midline shift. No CT evidence of acute infarction. Mild intracranial atherosclerosis. Nonspecific moderate subcortical and periventricular white matter hypodensity, most in keeping with chronic small vessel ischemic change. Generalized cerebral volume loss. Cerebral ventricle sizes are stable and concordant with the degree of cerebral volume loss. Mucoperiosteal thickening throughout the visualized left maxillary sinus with associated small fluid level. Mucoperiosteal thickening  throughout the dependent frontal sinus, sphenoid sinus and bilateral ethmoidal air cells. Small right greater than left mastoid effusions. No evidence of calvarial fracture. CT CERVICAL SPINE FINDINGS No fracture is detected in the cervical spine. 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 No prevertebral soft tissue swelling. Normal cervical lordosis. Dens is well positioned between the lateral masses of C1. No acute facet subluxation. Marked degenerative disc disease throughout the cervical spine, most prominent at C6-7 with bulky anterior marginal osteophytes at C3-4. Posterior disc bulge at C3-4 effaces the anterior thecal sac with no definite cord flattening, unchanged. Severe bilateral facet arthropathy. Moderate to severe foraminal stenosis on the left at C3-4. Severe bilateral foraminal stenosis at C4-5. Stable 4 mm anterolisthesis at C6-7. No acute cervical spine subluxation. Small bilateral inferior mastoid effusions. No evidence of intra-axial hemorrhage in the visualized brain. No gross cervical canal hematoma. No significant pulmonary nodules at the visualized lung apices. No cervical adenopathy or other significant neck soft tissue abnormality. IMPRESSION: 1. No evidence of acute intracranial abnormality. No evidence of calvarial fracture. 2. Generalized cerebral volume loss and moderate chronic small vessel ischemia. 3. No cervical spine fracture or acute malalignment. 4. 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 Stable 4 mm anterolisthesis at C6-7. 5. Severe degenerative changes in the cervical spine as described. 6. Paranasal sinusitis, possibly acute. 7. Small bilateral mastoid effusions. Electronically Signed   By: Ilona Sorrel M.D.   On: 03/05/2016 20:47   Ct Cervical Spine Wo Contrast  03/05/2016  CLINICAL DATA:  Unwitnessed fall. EXAM: CT HEAD WITHOUT CONTRAST CT CERVICAL SPINE  WITHOUT CONTRAST TECHNIQUE: Multidetector CT imaging of the head and cervical spine was performed following the standard protocol without intravenous contrast. Multiplanar CT image reconstructions of the cervical spine were also generated. COMPARISON:  03/29/2015 head and cervical spine CT. FINDINGS: CT HEAD FINDINGS No evidence of parenchymal hemorrhage or extra-axial fluid collection. No mass lesion, mass effect, or midline shift. No CT evidence of acute infarction. Mild intracranial atherosclerosis. Nonspecific moderate subcortical and periventricular white matter hypodensity, most in keeping with chronic small vessel ischemic change. Generalized cerebral volume loss. Cerebral ventricle sizes are stable and concordant with the degree of cerebral volume loss. Mucoperiosteal thickening throughout the visualized left maxillary  sinus with associated small fluid level. Mucoperiosteal thickening throughout the dependent frontal sinus, sphenoid sinus and bilateral ethmoidal air cells. Small right greater than left mastoid effusions. No evidence of calvarial fracture. CT CERVICAL SPINE FINDINGS No fracture is detected in the cervical spine. 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 No prevertebral soft tissue swelling. Normal cervical lordosis. Dens is well positioned between the lateral masses of C1. No acute facet subluxation. Marked degenerative disc disease throughout the cervical spine, most prominent at C6-7 with bulky anterior marginal osteophytes at C3-4. Posterior disc bulge at C3-4 effaces the anterior thecal sac with no definite cord flattening, unchanged. Severe bilateral facet arthropathy. Moderate to severe foraminal stenosis on the left at C3-4. Severe bilateral foraminal stenosis at C4-5. Stable 4 mm anterolisthesis at C6-7. No acute cervical spine subluxation. Small bilateral inferior mastoid effusions. No evidence of  intra-axial hemorrhage in the visualized brain. No gross cervical canal hematoma. No significant pulmonary nodules at the visualized lung apices. No cervical adenopathy or other significant neck soft tissue abnormality. IMPRESSION: 1. No evidence of acute intracranial abnormality. No evidence of calvarial fracture. 2. Generalized cerebral volume loss and moderate chronic small vessel ischemia. 3. No cervical spine fracture or acute malalignment. 4. 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 Stable 4 mm anterolisthesis at C6-7. 5. Severe degenerative changes in the cervical spine as described. 6. Paranasal sinusitis, possibly acute. 7. Small bilateral mastoid effusions. Electronically Signed   By: Ilona Sorrel M.D.   On: 03/05/2016 20:47   Dg Chest Port 1 View  03/09/2016  CLINICAL DATA:  Patient with shortness of breath. EXAM: PORTABLE CHEST 1 VIEW COMPARISON:  Chest radiograph 03/05/2016. FINDINGS: Multiple monitoring leads overlie the patient. Stable cardiomegaly. Tortuosity of the thoracic aorta. Pulmonary vascular redistribution. No large area of pulmonary consolidation. No pleural effusion pneumothorax. IMPRESSION: Cardiomegaly with pulmonary vascular redistribution. No acute cardiopulmonary process. Electronically Signed   By: Lovey Newcomer M.D.   On: 03/09/2016 14:43   Dg Humerus Left  03/05/2016  CLINICAL DATA:  [place] yesterday.  Posterior elbow pain. EXAM: LEFT HUMERUS - 2+ VIEW COMPARISON:  None. FINDINGS: The left humerus appears intact. No evidence of acute fracture or dislocation. No focal bone lesion or bone destruction. Soft tissues are unremarkable. Note that the elbow is not optimally evaluated on humeral films. If there is suspicion of elbow injury, elbow series should be obtained. IMPRESSION: Negative. Electronically Signed   By: Lucienne Capers M.D.   On: 03/05/2016 23:58    Time Spent in minutes 35   Louellen Molder M.D on 03/09/2016 at 4:46 PM  Between  7am to 7pm - Pager - [phone]  After 7pm go to [email or web address] - password [facility]  [facility] -  Office  [phone]

## 2016-03-10 ENCOUNTER — Inpatient Hospital Stay (HOSPITAL_COMMUNITY): Payer: Medicare PPO

## 2016-03-10 DIAGNOSIS — R609 Edema, unspecified: Secondary | ICD-10-CM

## 2016-03-10 DIAGNOSIS — I2699 Other pulmonary embolism without acute cor pulmonale: Secondary | ICD-10-CM

## 2016-03-10 LAB — CBC
HEMATOCRIT: 41.7 % (ref 39.0–52.0)
HEMOGLOBIN: 13.3 g/dL (ref 13.0–17.0)
MCH: 26.3 pg (ref 26.0–34.0)
MCHC: 31.9 g/dL (ref 30.0–36.0)
MCV: 82.6 fL (ref 78.0–100.0)
Platelets: 67 10*3/uL — ABNORMAL LOW (ref 150–400)
RBC: 5.05 MIL/uL (ref 4.22–5.81)
RDW: 15.5 % (ref 11.5–15.5)
WBC: 109.6 10*3/uL (ref 4.0–10.5)

## 2016-03-10 LAB — CULTURE, BLOOD (ROUTINE X 2): Culture: NO GROWTH

## 2016-03-10 LAB — HEPARIN LEVEL (UNFRACTIONATED)
HEPARIN UNFRACTIONATED: 0.13 [IU]/mL — AB (ref 0.30–0.70)
Heparin Unfractionated: 0.13 IU/mL — ABNORMAL LOW (ref 0.30–0.70)

## 2016-03-10 LAB — TROPONIN I: TROPONIN I: 0.07 ng/mL — AB (ref <0.031)

## 2016-03-10 MED ORDER — MORPHINE SULFATE (PF) 2 MG/ML IV SOLN
2.0000 mg | INTRAVENOUS | Status: DC | PRN
  Administered 2016-03-11 – 2016-03-13 (×10): 2 mg via INTRAVENOUS
  Filled 2016-03-10 (×10): qty 1

## 2016-03-10 MED ORDER — METOPROLOL TARTRATE 50 MG PO TABS
50.0000 mg | ORAL_TABLET | Freq: Two times a day (BID) | ORAL | Status: DC
  Administered 2016-03-10 (×2): 50 mg via ORAL
  Filled 2016-03-10 (×2): qty 1

## 2016-03-10 MED ORDER — DEXTROSE-NACL 5-0.9 % IV SOLN
INTRAVENOUS | Status: DC
  Administered 2016-03-10 – 2016-03-11 (×2): via INTRAVENOUS

## 2016-03-10 MED ORDER — METOPROLOL TARTRATE 5 MG/5ML IV SOLN
5.0000 mg | Freq: Once | INTRAVENOUS | Status: AC
  Administered 2016-03-10: 5 mg via INTRAVENOUS
  Filled 2016-03-10: qty 5

## 2016-03-10 MED ORDER — FUROSEMIDE 10 MG/ML IJ SOLN
40.0000 mg | Freq: Once | INTRAMUSCULAR | Status: AC
  Administered 2016-03-10: 40 mg via INTRAVENOUS
  Filled 2016-03-10: qty 4

## 2016-03-10 NOTE — Progress Notes (Addendum)
*  Preliminary Results* Left upper extremity venous duplex completed. Left upper extremity is positive for ACUTE MOBILE deep vein thrombosis involving the left subclavian vein. There is evidence of acute deep vein thrombosis involving the left axillary and left brachial veins. There is evidence of acute superficial vein thrombosis involving the left basilic and left cephalic veins.   Bilateral lower extremity venous duplex completed. Study was technically limited due to patient sitting upright due to difficulty breathing when lying flat. Bilateral femoral veins are noncompressible in some segments, suggestive of deep vein thrombosis, however they are patent by color flow Doppler. This is suggestive of non-occlusive acute deep vein thrombosis versus increased venous pressure secondary to patient position. Cannot exclude the presence of acute non-occlusive deep vein thrombosis.  Preliminary results discussed with Mendel Ryder, RN and Dr. Clementeen Graham.   03/10/2016 10:22 AM  Maudry Mayhew, RVT, RDCS, RDMS

## 2016-03-10 NOTE — Procedures (Signed)
Objective Swallowing Evaluation: Type of Study: FEES-Fiberoptic Endoscopic Evaluation of Swallow  Patient Details  Name: Dale George MRN: [id number] Date of Birth: 1946/01/03  Today's Date: 03/10/2016 Time: SLP Start Time (ACUTE ONLY): 1417-SLP Stop Time (ACUTE ONLY): 1453 SLP Time Calculation (min) (ACUTE ONLY): 36 min  Past Medical History:  Past Medical History  Diagnosis Date  . Cancer (HCC)     tongue cancer  . Hypertension   . Hyperlipidemia   . Lymphocytosis 12/14/2013  . Fatigue 12/14/2013  . CLL (chronic lymphocytic leukemia) ([place]) 12/23/2013  . Gout   . Insomnia   . Chronic kidney disease     Stage 3  . Dementia   . Ataxia    Past Surgical History:  Past Surgical History  Procedure Laterality Date  . Lymph node dissection     HPI: 70 y.o. 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 Stable 4 mm anterolisthesis at C6-7; severe degenerative changes in the cervical spine. CXR no acute  Subjective: "don't feel great. Can I have some water?"  Assessment / Plan / Recommendation  CHL IP CLINICAL IMPRESSIONS 03/10/2016  Therapy Diagnosis Moderate pharyngeal phase dysphagia  Clinical Impression Moderate sensorimotor pharygneal dysphagia. View of larynx limited due to frequent occlusion of scope with secretions and weak volitional swallow to clear. Decreased sensation with delayed swallow initiation to pyriform sinuses and residue with poor clearance. Laryngeal penetration with thin and nectar (mild and silent). Spoke with pt's daughter Caryl Pina who reported recent  difficulty swallowing at home. Highly suspect chronic dysphagia from effect of radiation treatment 20 years ago for lingual cancer with inability to compensate with illness and deconditioned state. Given today's clinical findings, increasing weakness since this am, recommend pt be NPO with possible short term alternative means of nutrition as he will likely need more than several days to improve.     Impact on safety and function Severe aspiration risk      CHL IP TREATMENT RECOMMENDATION 03/10/2016  Treatment Recommendations Therapy as outlined in treatment plan below     Prognosis 03/10/2016  Prognosis for Safe Diet Advancement Fair  Barriers to Reach Goals Time post onset;Severity of deficits  Barriers/Prognosis Comment --    CHL IP DIET RECOMMENDATION 03/10/2016  SLP Diet Recommendations NPO  Liquid Administration via --  Medication Administration Via alternative means  Compensations --  Postural Changes --      CHL IP OTHER RECOMMENDATIONS 03/10/2016  Recommended Consults --  Oral Care Recommendations Oral care QID  Other Recommendations --      CHL IP FOLLOW UP RECOMMENDATIONS 03/10/2016  Follow up Recommendations (No Data)      CHL IP FREQUENCY AND DURATION 03/10/2016  Speech Therapy Frequency (ACUTE ONLY) min 2x/week  Treatment Duration 2 weeks           CHL IP ORAL PHASE 03/10/2016  Oral Phase (No Data)  Oral - Pudding Teaspoon --  Oral - Pudding Cup --  Oral - Honey Teaspoon --  Oral - Honey Cup --  Oral - Nectar Teaspoon --  Oral - Nectar Cup --  Oral - Nectar Straw --  Oral - Thin Teaspoon --  Oral -  Thin Cup --  Oral - Thin Straw --  Oral - Puree --  Oral - Mech Soft --  Oral - Regular --  Oral - Multi-Consistency --  Oral - Pill --  Oral Phase - Comment --    CHL IP PHARYNGEAL PHASE 03/10/2016  Pharyngeal Phase Impaired  Pharyngeal- Pudding Teaspoon --  Pharyngeal --  Pharyngeal- Pudding Cup --  Pharyngeal --  Pharyngeal- Honey Teaspoon --   Pharyngeal --  Pharyngeal- Honey Cup --  Pharyngeal --  Pharyngeal- Nectar Teaspoon Delayed swallow initiation-vallecula;Pharyngeal residue - pyriform;Pharyngeal residue - valleculae;Reduced laryngeal elevation;Reduced tongue base retraction;Penetration/Aspiration during swallow;Delayed swallow initiation-pyriform sinuses  Pharyngeal Material enters airway, remains ABOVE vocal cords and not ejected out  Pharyngeal- Nectar Cup --  Pharyngeal --  Pharyngeal- Nectar Straw --  Pharyngeal --  Pharyngeal- Thin Teaspoon Penetration/Aspiration during swallow;Pharyngeal residue - pyriform;Reduced laryngeal elevation;Delayed swallow initiation-pyriform sinuses  Pharyngeal Material enters airway, remains ABOVE vocal cords and not ejected out  Pharyngeal- Thin Cup --  Pharyngeal --  Pharyngeal- Thin Straw --  Pharyngeal --  Pharyngeal- Puree Pharyngeal residue - valleculae;Pharyngeal residue - pyriform;Reduced tongue base retraction;Reduced laryngeal elevation  Pharyngeal --  Pharyngeal- Mechanical Soft --  Pharyngeal --  Pharyngeal- Regular --  Pharyngeal --  Pharyngeal- Multi-consistency --  Pharyngeal --  Pharyngeal- Pill --  Pharyngeal --  Pharyngeal Comment --     CHL IP CERVICAL ESOPHAGEAL PHASE 03/10/2016  Cervical Esophageal Phase WFL  Pudding Teaspoon --  Pudding Cup --  Honey Teaspoon --  Honey Cup --  Nectar Teaspoon --  Nectar Cup --  Nectar Straw --  Thin Teaspoon --  Thin Cup --  Thin Straw --  Puree --  Mechanical Soft --  Regular --  Multi-consistency --  Pill --  Cervical Esophageal Comment --    No flowsheet data found.  Houston Siren 03/10/2016, 3:57 PM   Orbie Pyo Colvin Caroli.Ed [employer] [phone]

## 2016-03-10 NOTE — Progress Notes (Signed)
ANTICOAGULATION CONSULT NOTE - Follow Up Consult  Pharmacy Consult for Heparin Indication: pulmonary embolus  No Known Allergies  Patient Measurements: Height: 6\' 6"  (198.1 cm) Weight: 290 lb 8 oz (131.77 kg) IBW/kg (Calculated) : 91.4 Heparin Dosing Weight: 119.5 kg  Vital Signs: Temp: 98 F (36.7 C) (06/19 0741) Temp Source: Oral (06/19 0741) BP: 129/74 mmHg (06/19 0741) Pulse Rate: 107 (06/19 0741)  Labs:  Recent Labs  03/08/16 0540 03/09/16 1532 03/09/16 2225 03/10/16 0428 03/10/16 0847  HGB 13.9 13.7  --  13.3  --   HCT 43.0 42.7  --  41.7  --   PLT 82* 72*  --  67*  --   HEPARINUNFRC  --   --   --   --  0.13*  CREATININE 1.00 1.02  --   --   --   CKTOTAL  --   --  732*  --   --   TROPONINI  --  0.12* 0.08* 0.07*  --     Estimated Creatinine Clearance: 102.6 mL/min (by C-G formula based on Cr of 1.02).  Assessment: 4 YOM with hx CLL who presented on 6/15 s/p fall with left arm pain, rhabdo, and rectal bleeding. Rectal bleeding since resolved. The patient was noted to have SOB and increased WOB with CTA on 6/18 showing PE. Pharmacy has been consulted to start heparin for anticoagulation.    Initial heparin level is subtherapeutic (0.13) on 1800 units/hr.  No infusion problems. No bleeding reported.  Chronic thrombocytopenia, platelet count seems to run ~100-120s. 101K on admit 6/14 and has trended down to 67K today. Patient is currently off the floor for studies.  Goal of Therapy:  Heparin level 0.3-0.7 units/ml Monitor platelets by anticoagulation protocol: Yes   Plan:   Increase heparin drip to 2100 units/hr upon return to floor.  Heparin level ~6 hrs after increase.  Daily heparin level and CBC while on heparin.  Monitor for any bleeding.   Arty Baumgartner, [place] Pager: [phone] 03/10/2016,10:06 AM

## 2016-03-10 NOTE — Care Management Important Message (Signed)
Important Message  Patient Details  Name: Dale George MRN: [id number] Date of Birth: 12-04-1945   Medicare Important Message Given:  Yes    [name] 03/10/2016, 10:29 AM

## 2016-03-10 NOTE — Progress Notes (Signed)
Pt urine dark orange/red. Dr. Clementeen Graham notified. Will monitor closely for now.

## 2016-03-10 NOTE — Progress Notes (Signed)
ANTICOAGULATION CONSULT NOTE - Follow Up Consult  Pharmacy Consult for Heparin Indication: pulmonary embolus  No Known Allergies  Patient Measurements: Height: 6\' 6"  (198.1 cm) Weight: 290 lb 8 oz (131.77 kg) IBW/kg (Calculated) : 91.4 Heparin Dosing Weight: 119.5 kg  Vital Signs: Temp: 98 F (36.7 C) (06/19 0741) Temp Source: Oral (06/19 0741) BP: 129/74 mmHg (06/19 0741) Pulse Rate: 107 (06/19 0741)  Labs:  Recent Labs  03/08/16 0540 03/09/16 1532 03/09/16 2225 03/10/16 0428 03/10/16 0847 03/10/16 1533  HGB 13.9 13.7  --  13.3  --   --   HCT 43.0 42.7  --  41.7  --   --   PLT 82* 72*  --  67*  --   --   HEPARINUNFRC  --   --   --   --  0.13* 0.13*  CREATININE 1.00 1.02  --   --   --   --   CKTOTAL  --   --  732*  --   --   --   TROPONINI  --  0.12* 0.08* 0.07*  --   --     Estimated Creatinine Clearance: 102.6 mL/min (by C-G formula based on Cr of 1.02).  Assessment: 33 YOM with hx CLL who presented on 6/15 s/p fall with left arm pain, rhabdo, and rectal bleeding. Rectal bleeding since resolved. The patient was noted to have SOB and increased WOB with CTA on 6/18 showing PE. Pharmacy has been consulted to start heparin for anticoagulation.   Heparin level unchanged at 0.13 units/mL despite rate increase earlier today. Confirmed with RN Mendel Ryder that there have been no issues with IV site/line, and it is running at ordered rate of 2100 units/hr. Patient does have dark amber urine- concerned for blood earlier, but not frank blood and has not been worsening. Difficult to tell d/t baseline urine color. Continue to monitor.    Chronic thrombocytopenia, platelet count seems to run ~100-120s. 101 on admit 6/14 and has trended down to 67 today. Hematologist Gorsuch ok with contining anticoagulation as long as platelets are >50 (per Dr. Verita Lamb note)  Goal of Therapy:  Heparin level 0.3-0.7 units/ml Monitor platelets by anticoagulation protocol: Yes   Plan:  - Increase  heparin drip to 2400 units/hr - Heparin level ~6 hrs after increase - Daily heparin level and CBC while on heparin - Monitor for any bleeding  Adlene Adduci D. Nicholson Starace, PharmD, BCPS Clinical Pharmacist Pager: [phone] 03/10/2016 4:57 PM

## 2016-03-10 NOTE — Progress Notes (Signed)
OT Cancellation Note  Patient Details Name: Dale George MRN: [id number] DOB: May 01, 1946   Cancelled Treatment:    Reason Eval/Treat Not Completed: Medical issues which prohibited therapy. Pt positive for PEs and has not been on heparin for 24 hours. Will hold evaluation for now.  Benito Mccreedy OTR/L [id number] 03/10/2016, 10:36 AM

## 2016-03-10 NOTE — Progress Notes (Signed)
PT Cancellation Note  Patient Details Name: Dale George MRN: [id number] DOB: 1946/04/28   Cancelled Treatment:    Reason Eval/Treat Not Completed: Patient at procedure or test/unavailable;Medical issues which prohibited therapy (critical labs).  Will check labs later and see how procedures are progressing as he has a swallow test at 1400.   Ramond Dial 03/10/2016, 10:02 AM    Mee Hives, PT MS Acute Rehab Dept. Number: [place] [facility] [place]

## 2016-03-10 NOTE — Evaluation (Signed)
Clinical/Bedside Swallow Evaluation Patient Details  Name: Dale George MRN: [id number] Date of Birth: 06-Nov-1945  Today's Date: 03/10/2016 Time: SLP Start Time (ACUTE ONLY): 0807 SLP Stop Time (ACUTE ONLY): 0832 SLP Time Calculation (min) (ACUTE ONLY): 25 min  Past Medical History:  Past Medical History  Diagnosis Date  . Cancer (HCC)     tongue cancer  . Hypertension   . Hyperlipidemia   . Lymphocytosis 12/14/2013  . Fatigue 12/14/2013  . CLL (chronic lymphocytic leukemia) ([place]) 12/23/2013  . Gout   . Insomnia   . Chronic kidney disease     Stage 3  . Dementia   . Ataxia    Past Surgical History:  Past Surgical History  Procedure Laterality Date  . Lymph node dissection     HPI:  70 y.o. 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 Stable 4 mm anterolisthesis at C6-7; severe degenerative changes in the cervical spine. CXR no acute   Assessment / Plan / Recommendation Clinical Impression  Consistent throat clearing/coughing attempting to clear pharynx at baseline. Apparent intervention (surgical ?) on right upper neck/base of tongue likely related to lingual cancer. Laryngeal elevation during swallow decreased with multiple swallows with sip water and immediate coughs. Recommend FEES (shoulders too broad for MBS) with pills crushed in applesauce only. FEES today at 1400.    Aspiration Risk   (mod-severe)    Diet Recommendation NPO except meds   Medication Administration: Crushed with puree    Other  Recommendations     Follow up Recommendations   (TBD)     Frequency and Duration            Prognosis        Swallow Study   General HPI: 70 y.o. 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 Stable 4 mm anterolisthesis at C6-7; severe degenerative changes in the cervical spine. CXR no acute Type of Study: Bedside Swallow Evaluation Previous Swallow Assessment: none Diet Prior to this Study: Regular;Thin liquids Temperature Spikes Noted: No Respiratory Status: Nasal cannula History of Recent Intubation: No Behavior/Cognition: Alert;Cooperative;Pleasant mood;Requires cueing Oral Cavity Assessment: Within Functional Limits Oral Care Completed by SLP: No Oral Cavity - Dentition: Adequate natural dentition Vision: Functional for self-feeding Self-Feeding Abilities: Able to feed self Patient Positioning: Upright in bed Baseline Vocal Quality: Normal Volitional Cough: Weak Volitional Swallow: Able to elicit    Oral/Motor/Sensory Function Overall Oral Motor/Sensory Function: Within functional limits   Ice Chips Ice chips: Not tested   Thin Liquid Thin Liquid: Impaired Presentation: Cup;Straw Oral Phase Impairments:  (none) Oral Phase Functional Implications:  (none) Pharyngeal  Phase Impairments: Decreased hyoid-laryngeal movement;Multiple swallows;Cough - Immediate;Cough - Delayed    Nectar Thick Nectar Thick Liquid: Not tested   Honey Thick Honey Thick Liquid: Not tested   Puree Puree: Not tested  Solid   GO   Solid: Not tested        [place] Siren 03/10/2016,9:03 AM  Orbie Pyo Colvin Caroli.Ed  [employer] [phone]

## 2016-03-10 NOTE — Progress Notes (Signed)
PROGRESS NOTE                                                                                                                                                                                                             Patient Demographics:    Dale George, is a 70 y.o. male, DOB - 09-14-1946, TD:[id number]  Admit date - 03/05/2016   Admitting Physician Ivor Costa, MD  Outpatient Primary MD for the patient is Vena Austria, MD  LOS - 5  Outpatient Specialists: Oncology (Dr. Alvy Bimler) Neurology (Dr. Rexene Alberts)  Chief Complaint  Patient presents with  . 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 Patient called EMS who found him on the floor and complained of left arm pain. He was also found to have about 200 mL bright red blood per rectum. In the ED he had WBC of 32K, temperature 99.8 F, normal hemoglobin, platelets of 101, lactate of 2.3 and CK of 2500. He was tachycardic and tachypnea with potassium of 7.5 (repeated of 4.7). UA and chest x-ray unremarkable. Head CT and cervical spine negative for acute fracture or abnormality. Patient admitted for acute rhabdomyolysis . Hospital course medicated due to development of acute PE and left upper extremity DVT   Subjective:   Patient suddenly became unstable and tachycardic, tachypneic. Noted to have acute left upper extremity swelling with redness. Chest x-ray showed mild congestion. Elevated d-dimer on labs. CT angiogram of the chest done showed moderate burden bilateral PE.   Assessment  & Plan :    Principal Problem:  Acute bilateral PE with mobile left upper extremity DVT Patient suddenly became tachycardic and tachypnea, 6/18  with elevated d-dimer. CT angiogram of the chest showing moderate burden of pulmonary emboli R >L small infarct or posterior right lower lobe. Doppler of left upper extremity shows acute mobile DVT involving the subclavian vein, acute DVT involving left axillary and left brachial veins along with acute superficial vein thrombosis in the left basal he can left cephalic vein. No asymmetry Doppler was technically limited as patient unable to lie flat due to shortness of breath. A nonocclusive DVT versus  increased venous pressure seen in bilateral femoral vein and cannot rule out DVT. Patient started on IV heparin drip. Noted for some drop in his baseline platelets. Spoke with patient's hematologist Dr. Alvy Bimler on the phone. Recommended he was okay to use anticoagulation as long as platelets >50. Recommended no significant role for thrombolysis or IVC filter. His dropping platelet could be due to consumption coagulopathy. Added low dose beta blocker. Check 2-D echo to rule out heart strain. Condition is guarded. Transfer to stepdown unit if remains tachycardic.   Sepsis with Acute rhabdomyolysis Secondary to fall and possible dehydration.no signs of infection. CPK improving with IV fluids. (2500-->750). Statin discontinued. Seen by PT and recommend skilled nursing facility.  Active Problems:    elevated troponin Chest pain symptoms. No ST-T changes on EKG except for sinus tachycardia. We'll cycle enzymes. Continue sublingual nitrate and metoprolol. Did not add aspirin due to chronic trauma cytopenia. If symptoms persist with further worsening troponin will need cardiology consult.     Acute on chronic kidney injury stage III Mild. Resolved with IV fluids.  Generalized weakness with frequent falls Seen by PT and recommend skilled nursing facility. Daughter concerned that patient cannot go back home and live by himself. [facility] for short-term rehabilitation with possible long-term skilled care  thereafter.  Multiple wounds with skin abrasion in bilateral feet knee, hip and flank Appreciate wound care evaluation.  Thrombocytopenia Baseline of around the low 100s. Noted for drop  In platelets to 70s this admission. Could be due to consumption from acute thrombosis. Monitor closely      CLL (chronic lymphocytic leukemia) (HCC) Chronically elevated WBC. Follows with Dr. Alvy Bimler    Thrombocytopenia [facility]) Chronic     Rectal bleeding and mild hematuria  No episodes while in the hospital. H&H stable.  mild hematuria noted this morning. Monitor H&H.     Transfer to stepdown unit if hemodynamically unstable.   Code Status : DO NOT RESUSCITATE (confirmed with daughter)  Family Communication  : Spoke with daughter Caryl Pina on the phone  Disposition Plan  : SNF in stable  Barriers For Discharge : Acute PE  Consults  :  none  Procedures  : CT head and cervical spine  DVT Prophylaxis  : IV heparin  Lab Results  Component Value Date   PLT 67* 03/10/2016    Antibiotics  :    Anti-infectives    Start     Dose/Rate Route Frequency Ordered Stop   03/06/16 1200  vancomycin (VANCOCIN) 1,250 mg in sodium chloride 0.9 % 250 mL IVPB  Status:  Discontinued     1,250 mg 166.7 mL/hr over 90 Minutes Intravenous Every 12 hours 03/05/16 2317 03/06/16 1059   03/06/16 0600  piperacillin-tazobactam (ZOSYN) IVPB 3.375 g  Status:  Discontinued     3.375 g 12.5 mL/hr over 240 Minutes Intravenous Every 8 hours 03/05/16 2317 03/06/16 1059   03/05/16 2330  piperacillin-tazobactam (ZOSYN) IVPB 3.375 g     3.375 g 100 mL/hr over 30 Minutes Intravenous STAT 03/05/16 2317 03/06/16 0033   03/05/16 2330  vancomycin (VANCOCIN) 2,500 mg in sodium chloride 0.9 % 500 mL IVPB  Status:  Discontinued     2,500 mg 250 mL/hr over 120 Minutes Intravenous Every 12 hours 03/05/16 2317 03/05/16 2318   03/05/16 2330  vancomycin (VANCOCIN) 2,500 mg in sodium chloride 0.9 % 500 mL IVPB     2,500 mg 250 mL/hr  over 120 Minutes Intravenous  Once 03/05/16 2318 03/06/16 0203  Objective:   Filed Vitals:   03/09/16 1835 03/09/16 1950 03/10/16 0434 03/10/16 0741  BP: 119/89 127/93 110/85 129/74  Pulse: 77 118 89 107  Temp: 97.8 F (36.6 C) 97.4 F (36.3 C) 98.2 F (36.8 C) 98 F (36.7 C)  TempSrc: Oral Oral Oral Oral  Resp: 20 20 18 18   Height:      Weight:  131.77 kg (290 lb 8 oz)    SpO2: 98% 96% 96% 97%    Wt Readings from Last 3 Encounters:  03/09/16 131.77 kg (290 lb 8 oz)  12/20/15 150.141 kg (331 lb)  06/26/15 154.631 kg (340 lb 14.4 oz)     Intake/Output Summary (Last 24 hours) at 03/10/16 1458 Last data filed at 03/10/16 1125  Gross per 24 hour  Intake  977.7 ml  Output   1675 ml  Net -697.3 ml     Physical Exam  GeNot in distress but fatigued, HEENT: moist mucosa, supple neck Chest: clear b/l, no added sounds CVS:  S1 and S2 tachycardic, no murmurs I: soft, NT, ND, BS+ Musculoskeletal:  swelling with vomiting left upper extremity with tenderness NS: Alert and oriented, nonfocal    Data Review:    CBC  Recent Labs Lab 03/05/16 1909  03/06/16 0112 03/07/16 0816 03/08/16 0540 03/09/16 1532 03/10/16 0428  WBC 132.2*  --  127.4* 93.3* 98.1* 113.6* 109.6*  HGB 15.8  < > 15.0 13.6 13.9 13.7 13.3  HCT 49.0  < > 47.1 43.0 43.0 42.7 41.7  PLT 101*  --  104* 70* 82* 72* 67*  MCV 82.6  --  84.3 83.7 82.4 82.6 82.6  MCH 26.6  --  26.8 26.5 26.6 26.5 26.3  MCHC 32.2  --  31.8 31.6 32.3 32.1 31.9  RDW 15.1  --  15.5 15.7* 15.7* 15.3 15.5  LYMPHSABS 117.7*  --   --  62.5* 87.3*  --   --   MONOABS 2.6*  --   --  0.0* 2.0*  --   --   EOSABS 0.0  --   --  0.0 0.0  --   --   BASOSABS 0.0  --   --  0.0 0.0  --   --   < > = values in this interval not displayed.  Chemistries   Recent Labs Lab 03/05/16 1909 03/05/16 2054  03/06/16 0724 03/06/16 1549 03/07/16 0816 03/08/16 0540 03/09/16 1532  NA 132* 132*  < > 136 138 141 135 137  K >7.5* 7.4*  < >  6.1* 5.5* 5.3* 4.5 4.6  CL 102 104  < > 105 110 111 103 105  CO2 21* 20*  < > 22 24 22  21* 23  GLUCOSE 136* 122*  < > 105* 108* 93 100* 127*  BUN 19 21*  < > 19 22* 16 13 15   CREATININE 1.33* 1.38*  < > 1.15 1.41* 1.07 1.00 1.02  CALCIUM 8.7* 8.7*  < > 7.8* 8.1* 8.4* 8.1* 8.2*  AST 95* 96*  --   --   --   --   --   --   ALT 55 53  --   --   --   --   --   --   ALKPHOS 95 93  --   --   --   --   --   --   BILITOT 1.7* 1.8*  --   --   --   --   --   --   < > =  values in this interval not displayed. ------------------------------------------------------------------------------------------------------------------ No results for input(s): CHOL, HDL, LDLCALC, TRIG, CHOLHDL, LDLDIRECT in the last 72 hours.  No results found for: HGBA1C ------------------------------------------------------------------------------------------------------------------ No results for input(s): TSH, T4TOTAL, T3FREE, THYROIDAB in the last 72 hours.  Invalid input(s): FREET3 ------------------------------------------------------------------------------------------------------------------ No results for input(s): VITAMINB12, FOLATE, FERRITIN, TIBC, IRON, RETICCTPCT in the last 72 hours.  Coagulation profile  Recent Labs Lab 03/06/16 0112  INR 1.41     Recent Labs  03/09/16 1532  DDIMER 12.34*    Cardiac Enzymes  Recent Labs Lab 03/09/16 1532 03/09/16 2225 03/10/16 0428  TROPONINI 0.12* 0.08* 0.07*   ------------------------------------------------------------------------------------------------------------------ No results found for: BNP  Inpatient Medications  Scheduled Meds: . metoprolol tartrate  50 mg Oral BID  . nystatin ointment   Topical BID  . sodium chloride flush  3 mL Intravenous Q12H   Continuous Infusions: . heparin 2,100 Units/hr (03/10/16 1215)   PRN Meds:.acetaminophen **OR** acetaminophen, morphine injection, nitroGLYCERIN, ondansetron **OR** ondansetron (ZOFRAN) IV,  oxyCODONE-acetaminophen  Micro Results Recent Results (from the past 240 hour(s))  Culture, blood (routine x 2)     Status: Abnormal   Collection Time: 03/05/16  6:30 PM  Result Value Ref Range Status   Specimen Description BLOOD RIGHT ANTECUBITAL  Final   Special Requests BOTTLES DRAWN AEROBIC AND ANAEROBIC 5CC  Final   Culture  Setup Time   Final    GRAM POSITIVE COCCI IN CLUSTERS ANAEROBIC BOTTLE ONLY CRITICAL RESULT CALLED TO, READ BACK BY AND VERIFIED WITH: C ROBERTSON PHARMD 1818 03/06/16 A BROWNING    Culture (A)  Final    STAPHYLOCOCCUS SPECIES (COAGULASE NEGATIVE) THE SIGNIFICANCE OF ISOLATING THIS ORGANISM FROM A SINGLE SET OF BLOOD CULTURES WHEN MULTIPLE SETS ARE DRAWN IS UNCERTAIN. PLEASE NOTIFY THE MICROBIOLOGY DEPARTMENT WITHIN ONE WEEK IF SPECIATION AND SENSITIVITIES ARE REQUIRED.    Report Status 03/08/2016 FINAL  Final  Blood Culture ID Panel (Reflexed)     Status: Abnormal   Collection Time: 03/05/16  6:30 PM  Result Value Ref Range Status   Enterococcus species NOT DETECTED NOT DETECTED Final   Vancomycin resistance NOT DETECTED NOT DETECTED Final   Listeria monocytogenes NOT DETECTED NOT DETECTED Final   Staphylococcus species DETECTED (A) NOT DETECTED Final    Comment: CRITICAL RESULT CALLED TO, READ BACK BY AND VERIFIED WITH: C ROBERTSON PHARMD 1818 03/06/16 A BROWNING    Staphylococcus aureus NOT DETECTED NOT DETECTED Final   Methicillin resistance DETECTED (A) NOT DETECTED Final    Comment: CRITICAL RESULT CALLED TO, READ BACK BY AND VERIFIED WITH: C ROBERTSON PHARMD 1818 03/06/16 A BROWNING    Streptococcus species NOT DETECTED NOT DETECTED Final   Streptococcus agalactiae NOT DETECTED NOT DETECTED Final   Streptococcus pneumoniae NOT DETECTED NOT DETECTED Final   Streptococcus pyogenes NOT DETECTED NOT DETECTED Final   Acinetobacter baumannii NOT DETECTED NOT DETECTED Final   Enterobacteriaceae species NOT DETECTED NOT DETECTED Final   Enterobacter  cloacae complex NOT DETECTED NOT DETECTED Final   Escherichia coli NOT DETECTED NOT DETECTED Final   Klebsiella oxytoca NOT DETECTED NOT DETECTED Final   Klebsiella pneumoniae NOT DETECTED NOT DETECTED Final   Proteus species NOT DETECTED NOT DETECTED Final   Serratia marcescens NOT DETECTED NOT DETECTED Final   Carbapenem resistance NOT DETECTED NOT DETECTED Final   Haemophilus influenzae NOT DETECTED NOT DETECTED Final   Neisseria meningitidis NOT DETECTED NOT DETECTED Final   Pseudomonas aeruginosa NOT DETECTED NOT DETECTED Final   Candida albicans NOT DETECTED NOT  DETECTED Final   Candida glabrata NOT DETECTED NOT DETECTED Final   Candida krusei NOT DETECTED NOT DETECTED Final   Candida parapsilosis NOT DETECTED NOT DETECTED Final   Candida tropicalis NOT DETECTED NOT DETECTED Final  Culture, blood (Routine X 2) w Reflex to ID Panel     Status: None (Preliminary result)   Collection Time: 03/05/16  8:35 PM  Result Value Ref Range Status   Specimen Description BLOOD RIGHT HAND  Final   Special Requests IN PEDIATRIC BOTTLE 2CC  Final   Culture NO GROWTH 4 DAYS  Final   Report Status PENDING  Incomplete  Urine culture     Status: None   Collection Time: 03/05/16 11:27 PM  Result Value Ref Range Status   Specimen Description URINE, CATHETERIZED  Final   Special Requests Normal  Final   Culture NO GROWTH  Final   Report Status 03/07/2016 FINAL  Final    Radiology Reports Dg Chest 2 View  03/05/2016  CLINICAL DATA:  Found lying on the floor with bright red bleeding from rectum. Fell yesterday. EXAM: CHEST  2 VIEW COMPARISON:  03/29/2015 FINDINGS: The lungs are clear without airspace disease or pulmonary edema. Heart and mediastinum are within normal limits. Negative for a pneumothorax. Lateral view is limited because the arms are not elevated. No large pleural effusions. Bony thorax is grossly intact. Degenerative changes at the right [facility] joint. IMPRESSION: No acute chest abnormality.  Electronically Signed   By: Markus Daft M.D.   On: 03/05/2016 19:57   Ct Head Wo Contrast  03/05/2016  CLINICAL DATA:  Unwitnessed fall. EXAM: CT HEAD WITHOUT CONTRAST CT CERVICAL SPINE WITHOUT CONTRAST TECHNIQUE: Multidetector CT imaging of the head and cervical spine was performed following the standard protocol without intravenous contrast. Multiplanar CT image reconstructions of the cervical spine were also generated. COMPARISON:  03/29/2015 head and cervical spine CT. FINDINGS: CT HEAD FINDINGS No evidence of parenchymal hemorrhage or extra-axial fluid collection. No mass lesion, mass effect, or midline shift. No CT evidence of acute infarction. Mild intracranial atherosclerosis. Nonspecific moderate subcortical and periventricular white matter hypodensity, most in keeping with chronic small vessel ischemic change. Generalized cerebral volume loss. Cerebral ventricle sizes are stable and concordant with the degree of cerebral volume loss. Mucoperiosteal thickening throughout the visualized left maxillary sinus with associated small fluid level. Mucoperiosteal thickening throughout the dependent frontal sinus, sphenoid sinus and bilateral ethmoidal air cells. Small right greater than left mastoid effusions. No evidence of calvarial fracture. CT CERVICAL SPINE FINDINGS No fracture is detected in the cervical spine. 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 No prevertebral soft tissue swelling. Normal cervical lordosis. Dens is well positioned between the lateral masses of C1. No acute facet subluxation. Marked degenerative disc disease throughout the cervical spine, most prominent at C6-7 with bulky anterior marginal osteophytes at C3-4. Posterior disc bulge at C3-4 effaces the anterior thecal sac with no definite cord flattening, unchanged. Severe bilateral facet arthropathy. Moderate to severe foraminal stenosis on the left at  C3-4. Severe bilateral foraminal stenosis at C4-5. Stable 4 mm anterolisthesis at C6-7. No acute cervical spine subluxation. Small bilateral inferior mastoid effusions. No evidence of intra-axial hemorrhage in the visualized brain. No gross cervical canal hematoma. No significant pulmonary nodules at the visualized lung apices. No cervical adenopathy or other significant neck soft tissue abnormality. IMPRESSION: 1. No evidence of acute intracranial abnormality. No evidence of calvarial fracture. 2. Generalized cerebral  volume loss and moderate chronic small vessel ischemia. 3. No cervical spine fracture or acute malalignment. 4. 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 Stable 4 mm anterolisthesis at C6-7. 5. Severe degenerative changes in the cervical spine as described. 6. Paranasal sinusitis, possibly acute. 7. Small bilateral mastoid effusions. Electronically Signed   By: Ilona Sorrel M.D.   On: 03/05/2016 20:47   Ct Angio Chest Pe W Or Wo Contrast  03/09/2016  ADDENDUM REPORT: 03/09/2016 22:06 ADDENDUM: Critical Value/emergent results were called by telephone at the time of interpretation on 03/09/2016 at 9:52 p.m. to Dr. Pincus Sanes, who verbally acknowledged these results. Electronically Signed   By: Marin Olp M.D.   On: 03/09/2016 22:06  03/09/2016  CLINICAL DATA:  Shortness of breath. EXAM: CT ANGIOGRAPHY CHEST WITH CONTRAST TECHNIQUE: Multidetector CT imaging of the chest was performed using the standard protocol during bolus administration of intravenous contrast. Multiplanar CT image reconstructions and MIPs were obtained to evaluate the vascular anatomy. CONTRAST:  80 mL Isovue 370 IV. COMPARISON:  Chest x-ray today. FINDINGS: Lungs are well inflated with minimal pleural based opacification over the posterior right lower lobe likely atelectasis versus infarct. No effusion. Airways are within normal. Heart is normal size. Thoracic aorta is within normal. Pulmonary  arterial system is well opacified and demonstrates multiple bilateral pulmonary emboli right worse than left. Most of the emboli are over the distal right main pulmonary artery and proximal right lower lobar arteries. Emboli throughout the right upper and middle lobe arteries as well as the lingular artery. RV/LV ratio is 0.8. No significant hilar or mediastinal adenopathy. Remaining mediastinal structures are within normal. Mild stranding of the fat in the left axilla adjacent the axillary vessels. Images through the upper abdomen demonstrate mild splenomegaly. Mild degenerate change of the spine. Review of the MIP images confirms the above findings. IMPRESSION: Moderate burden of pulmonary emboli right worse than left as described. RV/LV ratio 0.8 which is within normal. Small infarct over the posterior right lower lobe. Mild stranding of the fat in the left axilla adjacent the axillary vessels as cannot exclude left upper extremity venous thrombus. Recommend clinical correlation. Splenomegaly. Electronically Signed: By: Marin Olp M.D. On: 03/09/2016 21:42   Ct Cervical Spine Wo Contrast  03/05/2016  CLINICAL DATA:  Unwitnessed fall. EXAM: CT HEAD WITHOUT CONTRAST CT CERVICAL SPINE WITHOUT CONTRAST TECHNIQUE: Multidetector CT imaging of the head and cervical spine was performed following the standard protocol without intravenous contrast. Multiplanar CT image reconstructions of the cervical spine were also generated. COMPARISON:  03/29/2015 head and cervical spine CT. FINDINGS: CT HEAD FINDINGS No evidence of parenchymal hemorrhage or extra-axial fluid collection. No mass lesion, mass effect, or midline shift. No CT evidence of acute infarction. Mild intracranial atherosclerosis. Nonspecific moderate subcortical and periventricular white matter hypodensity, most in keeping with chronic small vessel ischemic change. Generalized cerebral volume loss. Cerebral ventricle sizes are stable and concordant with the  degree of cerebral volume loss. Mucoperiosteal thickening throughout the visualized left maxillary sinus with associated small fluid level. Mucoperiosteal thickening throughout the dependent frontal sinus, sphenoid sinus and bilateral ethmoidal air cells. Small right greater than left mastoid effusions. No evidence of calvarial fracture. CT CERVICAL SPINE FINDINGS No fracture is detected in the cervical spine. 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 No prevertebral soft tissue swelling. Normal cervical lordosis. Dens is well positioned between the lateral masses of C1. No acute  facet subluxation. Marked degenerative disc disease throughout the cervical spine, most prominent at C6-7 with bulky anterior marginal osteophytes at C3-4. Posterior disc bulge at C3-4 effaces the anterior thecal sac with no definite cord flattening, unchanged. Severe bilateral facet arthropathy. Moderate to severe foraminal stenosis on the left at C3-4. Severe bilateral foraminal stenosis at C4-5. Stable 4 mm anterolisthesis at C6-7. No acute cervical spine subluxation. Small bilateral inferior mastoid effusions. No evidence of intra-axial hemorrhage in the visualized brain. No gross cervical canal hematoma. No significant pulmonary nodules at the visualized lung apices. No cervical adenopathy or other significant neck soft tissue abnormality. IMPRESSION: 1. No evidence of acute intracranial abnormality. No evidence of calvarial fracture. 2. Generalized cerebral volume loss and moderate chronic small vessel ischemia. 3. No cervical spine fracture or acute malalignment. 4. 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 Stable 4 mm anterolisthesis at C6-7. 5. Severe degenerative changes in the cervical spine as described. 6. Paranasal sinusitis, possibly acute. 7. Small bilateral mastoid effusions. Electronically Signed   By: Ilona Sorrel M.D.   On: 03/05/2016 20:47   Dg Chest Port 1 View  03/09/2016  CLINICAL DATA:  Patient with shortness of breath. EXAM: PORTABLE CHEST 1 VIEW COMPARISON:  Chest radiograph 03/05/2016. FINDINGS: Multiple monitoring leads overlie the patient. Stable cardiomegaly. Tortuosity of the thoracic aorta. Pulmonary vascular redistribution. No large area of pulmonary consolidation. No pleural effusion pneumothorax. IMPRESSION: Cardiomegaly with pulmonary vascular redistribution. No acute cardiopulmonary process. Electronically Signed   By: Lovey Newcomer M.D.   On: 03/09/2016 14:43   Dg Humerus Left  03/05/2016  CLINICAL DATA:  [place] yesterday.  Posterior elbow pain. EXAM: LEFT HUMERUS - 2+ VIEW COMPARISON:  None. FINDINGS: The left humerus appears intact. No evidence of acute fracture or dislocation. No focal bone lesion or bone destruction. Soft tissues are unremarkable. Note that the elbow is not optimally evaluated on humeral films. If there is suspicion of elbow injury, elbow series should be obtained. IMPRESSION: Negative. Electronically Signed   By: Lucienne Capers M.D.   On: 03/05/2016 23:58    Time Spent in minutes 35   Louellen Molder M.D on 03/10/2016 at 2:58 PM  Between 7am to 7pm - Pager - [phone]  After 7pm go to [email or web address] - password [facility]  [facility] -  Office  [phone]

## 2016-03-11 ENCOUNTER — Inpatient Hospital Stay (HOSPITAL_COMMUNITY): Payer: Medicare PPO

## 2016-03-11 DIAGNOSIS — I82622 Acute embolism and thrombosis of deep veins of left upper extremity: Secondary | ICD-10-CM | POA: Diagnosis present

## 2016-03-11 DIAGNOSIS — M79602 Pain in left arm: Secondary | ICD-10-CM

## 2016-03-11 DIAGNOSIS — I340 Nonrheumatic mitral (valve) insufficiency: Secondary | ICD-10-CM

## 2016-03-11 DIAGNOSIS — I2699 Other pulmonary embolism without acute cor pulmonale: Secondary | ICD-10-CM | POA: Diagnosis not present

## 2016-03-11 LAB — ECHOCARDIOGRAM COMPLETE
E/e' ratio: 8.65
EWDT: 183 ms
FS: 24 % — AB (ref 28–44)
HEIGHTINCHES: 78 in
IVS/LV PW RATIO, ED: 0.83
LA ID, A-P, ES: 40 mm
LA diam index: 1.51 cm/m2
LA vol index: 16.4 mL/m2
LA vol: 43.4 mL
LAVOLA4C: 41.7 mL
LEFT ATRIUM END SYS DIAM: 40 mm
LV TDI E'MEDIAL: 7.4
LVEEAVG: 8.65
LVEEMED: 8.65
MV Dec: 183
MV pk A vel: 57.2 m/s
MV pk E vel: 64 m/s
PW: 14.3 mm — AB (ref 0.6–1.1)
WEIGHTICAEL: 4648 [oz_av]

## 2016-03-11 LAB — CBC
HEMATOCRIT: 41.5 % (ref 39.0–52.0)
Hemoglobin: 12.8 g/dL — ABNORMAL LOW (ref 13.0–17.0)
MCH: 26.1 pg (ref 26.0–34.0)
MCHC: 30.8 g/dL (ref 30.0–36.0)
MCV: 84.5 fL (ref 78.0–100.0)
PLATELETS: 63 10*3/uL — AB (ref 150–400)
RBC: 4.91 MIL/uL (ref 4.22–5.81)
RDW: 15.7 % — AB (ref 11.5–15.5)
WBC: 99.9 10*3/uL — AB (ref 4.0–10.5)

## 2016-03-11 LAB — HEPARIN LEVEL (UNFRACTIONATED)
HEPARIN UNFRACTIONATED: 0.19 [IU]/mL — AB (ref 0.30–0.70)
HEPARIN UNFRACTIONATED: 0.2 [IU]/mL — AB (ref 0.30–0.70)
Heparin Unfractionated: 0.31 IU/mL (ref 0.30–0.70)
Heparin Unfractionated: 0.37 IU/mL (ref 0.30–0.70)

## 2016-03-11 MED ORDER — METOPROLOL TARTRATE 5 MG/5ML IV SOLN
5.0000 mg | Freq: Four times a day (QID) | INTRAVENOUS | Status: DC
Start: 2016-03-11 — End: 2016-03-12
  Administered 2016-03-11 – 2016-03-12 (×5): 5 mg via INTRAVENOUS
  Filled 2016-03-11 (×5): qty 5

## 2016-03-11 NOTE — Progress Notes (Signed)
Physical Therapy Treatment Patient Details Name: Dale George MRN: [id number] DOB: [birth year] Today's Date: 03/11/2016    History of Present Illness Pt is a 70 y/o M who presents after fall at home.  Pt c/o Lt arm pain and rectal bleeding.  Pt was on floor for 24 hrs.  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    PT Comments    Dale George and demonstrates good participation in therapy; he is extremely debilitated, however, and required assist of 3 for safe transfer out of bed to recliner; Will continue to follow;   Follow Up Recommendations  SNF;Supervision/Assistance - 24 hour     Equipment Recommendations  Other (comment) (TBD at next venue of care)    Recommendations for Other Services Speech consult     Precautions / Restrictions Precautions Precautions: Fall Precaution Comments: monitor HR    Mobility  Bed Mobility Overal bed mobility: Needs Assistance Bed Mobility: Rolling;Sidelying to Sit Rolling: +2 for physical assistance;Mod assist Sidelying to sit: +2 for physical assistance;Max assist       General bed mobility comments: Assist to elevate trunk and to manage Bil LEs; very painful RLE while clearing it from the bed; limited use of LUE to roll to right  Transfers Overall transfer level: Needs assistance Equipment used:  (bed pads and elevated seat height of recliner with bedspread) Transfers: Lateral/Scoot Transfers;Sit to/from Stand Sit to Stand: +2 physical assistance;+2 safety/equipment;Total assist        Lateral/Scoot Transfers: +2 safety/equipment (third person helpful as well to stabilize the chair) General transfer comment: HR stable; Attempted sit to stand transfer, however pt with no significant muscle activation or weight bearing response bil LEs -- unable to clear hips from bed; Performed lateral scoot transfer bed to recliner on pt's R with armrest dropped out of the way;  noting good following George for hand placement, and push/pull for transfer, though weak; dependent on 2-3 people to scoot with bed pad and another person to stabilize chair  Ambulation/Gait                 Stairs            Wheelchair Mobility    Modified Rankin (Stroke Patients Only)       Balance Overall balance assessment: Needs assistance Sitting-balance support: Single extremity supported;Feet supported Sitting balance-Leahy Scale: Poor Sitting balance - Comments: Relies on at least 1 UE supported while sitting EOB.                              Cognition Arousal/Alertness: Awake/alert Behavior During Therapy: WFL for tasks assessed/performed Overall Cognitive Status: History of cognitive impairments - at baseline                      Exercises      General Comments        Pertinent Vitals/Pain Pain Assessment: 0-10 Pain Score: 5  Pain Location: LUE, RLE, upper back Pain Descriptors / Indicators: Aching;Grimacing;Guarding Pain Intervention(s): Limited activity within patient's tolerance;Monitored during session;Repositioned    Home Living Family/patient expects to be discharged to:: Skilled nursing facility                    Prior Function            PT Goals (current goals can now be found in the care plan section)  Acute Rehab PT Goals PT Goal Formulation: With patient/family Time For Goal Achievement: 03/22/16 Potential to Achieve Goals: Good Progress towards PT goals: Progressing toward goals    Frequency  Min 2X/week    PT Plan Current plan remains appropriate    Co-evaluation             End of Session Equipment Utilized During Treatment:  (bed pad) Activity Tolerance: Patient tolerated treatment well;Patient limited by pain Patient left: in chair;with call bell/phone within reach;with chair alarm set;Other (comment) (with SLP )     Time: [id number] PT Time Calculation (min) (ACUTE ONLY): 27  min  Charges:  $Therapeutic Activity: 8-22 mins                    G Codes:      Roney Marion [facility] 03/11/2016, 11:46 AM  Roney Marion, PT  Acute Rehabilitation Services Pager [phone] Office [phone]

## 2016-03-11 NOTE — Progress Notes (Signed)
*  PRELIMINARY RESULTS* Echocardiogram 2D Echocardiogram has been performed.  Leavy Cella 03/11/2016, 3:00 PM

## 2016-03-11 NOTE — Evaluation (Addendum)
Occupational Therapy Evaluation Patient Details Name: Dale George MRN: [id number] DOB: 1946/08/28 Today's Date: 03/11/2016    History of Present Illness Pt is a 70 y.o. M who presented after fall at home.  Pt c/o Lt arm pain and rectal bleeding.  Pt was on floor for 24 hrs.  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   Clinical Impression     Pt admitted with above. Unsure of pt's true PLOF for ADLs. Feel pt will benefit from acute OT to increase independence prior to d/c. Recommending SNF for rehab.    Follow Up Recommendations  SNF;Supervision/Assistance - 24 hour    Equipment Recommendations  Other (comment) (defer to next venue)    Recommendations for Other Services       Precautions / Restrictions Precautions Precautions: Fall Precaution Comments: monitor HR Restrictions Weight Bearing Restrictions: No      Mobility Bed Mobility Overal bed mobility: Needs Assistance Bed Mobility: Rolling;Sidelying to Sit Rolling: +2 for physical assistance;Mod assist Sidelying to sit: +2 for physical assistance;Max assist       General bed mobility comments: Assist to elevate trunk and to manage Bil LEs; very painful RLE while clearing it from the bed; limited use of LUE to roll to right  Transfers Overall transfer level: Needs assistance Equipment used:  (bed pads and elevated seat height of recliner) Transfers: Sit to/from Stand;Lateral/Scoot Transfers Sit to Stand:  (unable to stand)        Lateral/Scoot Transfers: +2 physical assistance;Total assist;+2 safety/equipment (third person also helpful to stabilize chair) General transfer comment: HR stable; Attempted sit to stand transfer, however pt with no significant muscle activation or weight bearing response bil LEs -- unable to clear hips from bed; Performed lateral scoot transfer bed to recliner on pt's R with armrest dropped out of the way; noting good following  commands for hand placement, and push/pull for transfer, though weak; dependent on 2-3 people to scoot with bed pad and another person to stabilize chair    Balance Overall balance assessment: Needs assistance Sitting-balance support: Single extremity supported;Feet supported Sitting balance-Leahy Scale: Poor Sitting balance - Comments: Relies on at least 1 UE supported while sitting EOB.                                      ADL Overall ADL's : Needs assistance/impaired     Grooming: Wash/dry face;Sitting;Minimal assistance (able to wash face with setup and supervision assist sitting EOB)           Upper Body Dressing : Moderate assistance;Sitting   Lower Body Dressing: +2 for physical assistance;Sitting/lateral leans;Bed level;Total assistance   Toilet Transfer: +2 for physical assistance;+2 for safety/equipment;Total assistance (+3 assist lateral scoot)           Functional mobility during ADLs: +2 for physical assistance;+2 for safety/equipment;Total assistance (+3 assist lateral scoot) General ADL Comments: educated on UB dressing technique.     Vision     Perception     Praxis      Pertinent Vitals/Pain Pain Assessment: 0-10 Pain Score: 5  Pain Location: bilateral LEs and LUE Pain Descriptors / Indicators: Sore Pain Intervention(s): Monitored during session     Hand Dominance     Extremity/Trunk Assessment Upper Extremity Assessment Upper Extremity Assessment: LUE deficits/detail LUE Deficits / Details: edema in LUE; weak grip strength and could perform shoulder flexion  less than 90 degrees sitting EOB   Lower Extremity Assessment Lower Extremity Assessment: Defer to PT evaluation       Communication Communication Communication: No difficulties   Cognition Arousal/Alertness: Awake/alert Behavior During Therapy: WFL for tasks assessed/performed Overall Cognitive Status: History of cognitive impairments - at baseline                      General Comments       Exercises       Shoulder Instructions      Home Living Family/patient expects to be discharged to:: Unsure (seems agreeable to SNF) Living Arrangements: Alone Available Help at Discharge: Family;Available PRN/intermittently Type of Home: Apartment Home Access: Stairs to enter Entrance Stairs-Number of Steps: 4 Entrance Stairs-Rails: Left;Right Home Layout: One level     Bathroom Shower/Tub: Tub/shower unit         Home Equipment: Environmental consultant - 2 wheels          Prior Functioning/Environment Level of Independence: Needs assistance  Gait / Transfers Assistance Needed: Not using AD PTA. ADL's / Homemaking Assistance Needed: per PT eval, would shower only when family present, but could complete independently.        OT Diagnosis: Generalized weakness;Acute pain   OT Problem List: Decreased strength;Decreased range of motion;Pain;Obesity;Increased edema;Impaired UE functional use;Decreased activity tolerance;Impaired balance (sitting and/or standing);Decreased cognition;Decreased knowledge of use of DME or AE;Decreased knowledge of precautions   OT Treatment/Interventions: Self-care/ADL training;DME and/or AE instruction;Therapeutic exercise;Therapeutic activities;Cognitive remediation/compensation;Patient/family education;Balance training    OT Goals(Current goals can be found in the care plan section) Acute Rehab OT Goals Patient Stated Goal: pt wanted water OT Goal Formulation: With patient Time For Goal Achievement: 03/25/16 Potential to Achieve Goals: Fair ADL Goals Pt Will Perform Upper Body Bathing: with min assist;sitting Pt Will Perform Upper Body Dressing: with min assist;sitting Pt Will Perform Lower Body Dressing: sitting/lateral leans;bed level;with adaptive equipment;with mod assist Pt Will Transfer to Toilet: with mod assist;with +2 assist;squat pivot transfer;bedside commode Additional ADL Goal #1: Pt will be independent  with 2 edema management techniques for LUE.   OT Frequency: Min 2X/week   Barriers to D/C:            Co-evaluation PT/OT/SLP Co-Evaluation/Treatment: Yes Reason for Co-Treatment: For patient/therapist safety   OT goals addressed during session: ADL's and self-care;Other (comment) (mobility)      End of Session Equipment Utilized During Treatment: Gait belt Nurse Communication: Other (comment) (nursing student in session)  Activity Tolerance: Patient tolerated treatment well;Patient limited by pain Patient left: in chair;Other (comment) (with PT, SLP, and nursing student in room)   Time: [phone] OT Time Calculation (min): 21 min Charges:  OT General Charges $OT Visit: 1 Procedure OT Evaluation $OT Eval Moderate Complexity: 1 Procedure G-CodesBenito Mccreedy OTR/L [id number] 03/11/2016, 12:17 PM

## 2016-03-11 NOTE — Progress Notes (Signed)
ANTICOAGULATION CONSULT NOTE - Follow Up Consult  Pharmacy Consult for Heparin Indication: pulmonary embolus  No Known Allergies  Patient Measurements: Height: 6\' 6"  (198.1 cm) Weight: 290 lb 8 oz (131.77 kg) IBW/kg (Calculated) : 91.4 Heparin Dosing Weight: 119.5 kg  Vital Signs: Temp: 97.7 F (36.5 C) (06/19 2021) Temp Source: Oral (06/19 2021) BP: 131/80 mmHg (06/19 2021) Pulse Rate: 101 (06/19 2021)  Labs:  Recent Labs  03/08/16 0540 03/09/16 1532 03/09/16 2225 03/10/16 0428 03/10/16 0847 03/10/16 1533 03/11/16  HGB 13.9 13.7  --  13.3  --   --   --   HCT 43.0 42.7  --  41.7  --   --   --   PLT 82* 72*  --  67*  --   --   --   HEPARINUNFRC  --   --   --   --  0.13* 0.13* 0.31  CREATININE 1.00 1.02  --   --   --   --   --   CKTOTAL  --   --  732*  --   --   --   --   TROPONINI  --  0.12* 0.08* 0.07*  --   --   --     Estimated Creatinine Clearance: 102.6 mL/min (by C-G formula based on Cr of 1.02).  Assessment: 65 YOM with hx CLL on heparin for acute PE.  Heparin level now up to low end of therapeutic (0.31) on 2400 units/hr. Pt does have dark amber urine- concerned for blood earlier, but not frank blood and has not been worsening. Difficult to tell d/t baseline urine color. Continue to monitor.    Chronic thrombocytopenia, platelet count seems to run ~100-120s. 101 on admit 6/14 and has trended down to 67 today. Hematologist Alvy Bimler ok with continuing anticoagulation as long as platelets are >50 (per Dr. Verita Lamb note)  Goal of Therapy:  Heparin level 0.3-0.7 units/ml Monitor platelets by anticoagulation protocol: Yes   Plan:  - Continue heparin drip at 2400 units/hr - Will f/u a.m. heparin level to verify therapeutic  Sherlon Handing, PharmD, BCPS Clinical pharmacist, pager [phone] 03/11/2016 1:00 AM

## 2016-03-11 NOTE — Progress Notes (Signed)
ANTICOAGULATION CONSULT NOTE - Follow Up Consult  Pharmacy Consult for Heparin Indication: pulmonary embolus and DVT (LUE)  No Known Allergies  Patient Measurements: Height: 6\' 6"  (198.1 cm) Weight: 290 lb 8 oz (131.77 kg) IBW/kg (Calculated) : 91.4 Heparin Dosing Weight: 119.5 kg  Vital Signs: Temp: 97.5 F (36.4 C) (06/20 0836) Temp Source: Oral (06/20 0836) BP: 131/79 mmHg (06/20 1141) Pulse Rate: 79 (06/20 0525)  Labs:  Recent Labs  03/09/16 1532 03/09/16 2225 03/10/16 0428  03/11/16 03/11/16 0436 03/11/16 1209  HGB 13.7  --  13.3  --  12.8*  --   --   HCT 42.7  --  41.7  --  41.5  --   --   PLT 72*  --  67*  --  63*  --   --   HEPARINUNFRC  --   --   --   < > 0.31 0.19* 0.37  CREATININE 1.02  --   --   --   --   --   --   CKTOTAL  --  732*  --   --   --   --   --   TROPONINI 0.12* 0.08* 0.07*  --   --   --   --   < > = values in this interval not displayed.  Estimated Creatinine Clearance: 102.6 mL/min (by C-G formula based on Cr of 1.02).  Assessment: 26 YOM with hx CLL who presented on 6/15 s/p fall with left arm pain, rhabdo, and rectal bleeding. Rectal bleeding since resolved. The patient was noted to have SOB and increased WOB with CTA on 6/18 showing PE. Pharmacy has been consulted for heparin dosing for PE and LUE DVT.   Heparin level is now therapeutic (0.37) on 2700 units/hr. Chronic thrombocytopenia, platelet count seems to run ~100-120s. 101K on admit 6/14 and has trended down to 67K today. Per heme/onc, ok to continue heparin unless platelet count falls below 50K.  Concern for possible blood in urine with dark urine yesterday, but urine now lighter in color.  Goal of Therapy:  Heparin level 0.3-0.7 units/ml Monitor platelets by anticoagulation protocol: Yes   Plan:   Continue heparin drip at 2700 units/hr.  Confirmation level at 8pm tonight.  Daily heparin level and CBC while on heparin.  Monitor for any bleeding.   Arty Baumgartner,  [place] Pager: [phone] 03/11/2016,1:41 PM

## 2016-03-11 NOTE — Progress Notes (Signed)
ANTICOAGULATION CONSULT NOTE - Follow Up Consult  Pharmacy Consult for heparin Indication: pulmonary embolus  Labs:  Recent Labs  03/09/16 1532 03/09/16 2225 03/10/16 0428  03/10/16 1533 03/11/16 03/11/16 0436  HGB 13.7  --  13.3  --   --  12.8*  --   HCT 42.7  --  41.7  --   --  41.5  --   PLT 72*  --  67*  --   --  63*  --   HEPARINUNFRC  --   --   --   < > 0.13* 0.31 0.19*  CREATININE 1.02  --   --   --   --   --   --   CKTOTAL  --  732*  --   --   --   --   --   TROPONINI 0.12* 0.08* 0.07*  --   --   --   --   < > = values in this interval not displayed.   Assessment: 70yo male now subtherapeutic on heparin after one level at low end of goal, no gtt issues overnight per RN.  Goal of Therapy:  Heparin level 0.3-0.7 units/ml   Plan:  Will increase heparin gtt by 2-3 units/kg/hr to 2700 units/hr and check level in 6hr.  Wynona Neat, PharmD, BCPS  03/11/2016,5:42 AM

## 2016-03-11 NOTE — NC FL2 (Signed)
[place] LEVEL OF CARE SCREENING TOOL     IDENTIFICATION  Patient Name: Dale George Birthdate: February 21, 1946 Sex: male Admission Date (Current Location): 03/05/2016  [facility] and [place] Number:  Herbalist and Address:  [facility], [place] [address], [place], [place] [address]      Provider Number: [id number]  Attending Physician Name and Address:  Louellen Molder, MD  Relative Name and Phone Number:  Kurtis Bushman - daughter. [phone]    Current Level of Care: Hospital Recommended Level of Care: [place] Prior Approval Number:    Date Approved/Denied:   PASRR Number: [id number] (Eff. 01/02/16)  Discharge Plan: SNF    Current Diagnoses: Patient Active Problem List   Diagnosis Date Noted  . Acute pulmonary embolism ([place]) 03/11/2016  . Acute deep vein thrombosis (DVT) of brachial vein of left upper extremity ([place]) 03/11/2016  . Pressure ulcer 03/06/2016  . Rhabdomyolysis 03/05/2016  . Rectal bleeding 03/05/2016  . Sepsis ([place]) 03/05/2016  . Left arm pain 03/05/2016  . CKD (chronic kidney disease), stage III 03/05/2016  . Essential hypertension 06/26/2015  . Hyperkalemia 03/30/2015  . Fall   . Acute kidney injury ([place]) 03/29/2015  . AKI (acute kidney injury) ([place]) 03/29/2015  . Candidiasis of mouth 12/25/2014  . Other fatigue 12/25/2014  . Thrombocytopenia ([place]) 09/25/2014  . CLL (chronic lymphocytic leukemia) ([place]) 12/23/2013  . Lymphocytosis 12/14/2013  . Fatigue 12/14/2013  . History of tongue cancer 12/14/2013  . Hyperlipidemia 03/11/2013  . HTN (hypertension) 03/11/2013  . Arthritis 03/11/2013  . History of gout 03/11/2013    Orientation RESPIRATION BLADDER Height & Weight     Self, Time, Situation, Place  Normal Incontinent Weight: 290 lb 8 oz (131.77 kg) Height:  6\' 6"  (198.1 cm)  BEHAVIORAL SYMPTOMS/MOOD NEUROLOGICAL BOWEL NUTRITION STATUS      Continent (patient has condom catheter)  Diet (Patient currently NPO)  AMBULATORY STATUS COMMUNICATION OF NEEDS Skin   Extensive Assist (Patient has been unable to ambulate with PT) Verbally Skin abrasions, Other (Comment) (Blisters to right knee, abdoomen and left foot. Moisture associated skin damage to buttocks with foam dressing. Rash on scrotum. Wounds to right great and 2nd toe and left 2nd and 3rd toes. Deep tissue injury to left hipj)                       Personal Care Assistance Level of Assistance  Bathing, Feeding, Dressing Bathing Assistance: Maximum assistance Feeding assistance: Independent (Patient needs assistance with set-up) Dressing Assistance: Maximum assistance     Functional Limitations Info  Sight, Hearing, Speech Sight Info: Adequate Hearing Info: Adequate Speech Info: Adequate    SPECIAL CARE FACTORS FREQUENCY  PT (By licensed PT), OT (By licensed OT), Speech therapy     PT Frequency: Evaluated 6/17 and a minimum  of 2X per week therapy recommended OT Frequency:  Evaluated 6/20 and a minimum of 2X per week therapy recommended     Speech Therapy Frequency: Speech evaluation 6/19      Contractures Contractures Info: Not present    Additional Factors Info  Code Status, Allergies Code Status Info: DNR Allergies Info: No known allergies           Current Medications (03/11/2016):  This is the current hospital active medication list Current Facility-Administered Medications  Medication Dose Route Frequency Provider Last Rate Last Dose  . acetaminophen (TYLENOL) tablet 650 mg  650 mg Oral Q6H PRN Soledad Gerlach  Blaine Hamper, MD       Or  . acetaminophen (TYLENOL) suppository 650 mg  650 mg Rectal Q6H PRN Ivor Costa, MD      . dextrose 5 %-0.9 % sodium chloride infusion   Intravenous Continuous Nishant Dhungel, MD 50 mL/hr at 03/11/16 1443    . heparin ADULT infusion 100 units/mL (25000 units/243mL sodium chloride 0.45%)  2,700 Units/hr Intravenous Continuous Laren Everts, RPH 27 mL/hr at 03/11/16 0813  2,700 Units/hr at 03/11/16 0813  . metoprolol (LOPRESSOR) injection 5 mg  5 mg Intravenous Q6H Nishant Dhungel, MD   5 mg at 03/11/16 1309  . morphine 2 MG/ML injection 2 mg  2 mg Intravenous Q3H PRN Nishant Dhungel, MD   2 mg at 03/11/16 1322  . nitroGLYCERIN (NITROSTAT) SL tablet 0.4 mg  0.4 mg Sublingual Q5 min PRN Nishant Dhungel, MD      . nystatin ointment (MYCOSTATIN)   Topical BID Mauricio Gerome Apley, MD      . ondansetron [facility]) tablet 4 mg  4 mg Oral Q6H PRN Ivor Costa, MD       Or  . ondansetron [facility]) injection 4 mg  4 mg Intravenous Q6H PRN Ivor Costa, MD      . sodium chloride flush (NS) 0.9 % injection 3 mL  3 mL Intravenous Q12H Ivor Costa, MD   3 mL at 03/10/16 1025     Discharge Medications: Please see discharge summary for a list of discharge medications.  Relevant Imaging Results:  Relevant Lab Results:   Additional Information SS# [id number]   [name], LCSW

## 2016-03-11 NOTE — Progress Notes (Signed)
Speech Language Pathology Treatment: Dysphagia  Patient Details Name: Dale George MRN: [id number] DOB: Apr 09, 1946 Today's Date: 03/11/2016 Time: [id number] SLP Time Calculation (min) (ACUTE ONLY): 13 min  Assessment / Plan / Recommendation Clinical Impression  Upon SLP arrival OT and PT co-treating for safety of transfer out of bed.  Patient then positioned upright in chair for SLP session.  SLP facilitated session with set-up and Mod assist for completion of oral care via suctioning.  Patient with wet vocal quality and coughing post oral care and required Max assist cues to attend to and clear standing oral secretions around gums.  SLP attempted to education patient in the execution of the [facility]; however patient unable to complete.  As a result, session focused on hard effortful swallows with Max multimodal cues to complete 5 reps with breaks for ice chip for 5 cycles.  Patient with throat clear throughout, but a strong, delayed reflexive cough was elicited at the end of session.  Recommend to continue with current diet orders and skilled SLP treatment.        HPI HPI: 70 y.o. 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 Stable 4 mm anterolisthesis at C6-7; severe degenerative changes in the cervical spine. CXR no acute      SLP Plan  Continue with current plan of care     Recommendations  Diet recommendations: NPO Medication Administration: Via alternative means             Oral Care Recommendations: Oral care QID Follow up Recommendations: 24 hour  supervision/assistance;Skilled Nursing facility Plan: Continue with current plan of care     GO              Carmelia Roller., CCC-SLP [id number]   [place] 03/11/2016, 10:09 AM

## 2016-03-11 NOTE — Progress Notes (Signed)
ANTICOAGULATION CONSULT NOTE - Follow Up Consult  Pharmacy Consult for Heparin Indication: pulmonary embolus and DVT (LUE)  No Known Allergies  Patient Measurements: Height: 6\' 6"  (198.1 cm) Weight: (!) 308 lb (139.708 kg) IBW/kg (Calculated) : 91.4 Heparin Dosing Weight: 119.5 kg  Vital Signs: Temp: 97.9 F (36.6 C) (06/20 2026) Temp Source: Oral (06/20 2026) BP: 128/86 mmHg (06/20 2026) Pulse Rate: 83 (06/20 2026)  Labs:  Recent Labs  03/09/16 1532 03/09/16 2225 03/10/16 0428  03/11/16 03/11/16 0436 03/11/16 1209 03/11/16 2000  HGB 13.7  --  13.3  --  12.8*  --   --   --   HCT 42.7  --  41.7  --  41.5  --   --   --   PLT 72*  --  67*  --  63*  --   --   --   HEPARINUNFRC  --   --   --   < > 0.31 0.19* 0.37 0.20*  CREATININE 1.02  --   --   --   --   --   --   --   CKTOTAL  --  732*  --   --   --   --   --   --   TROPONINI 0.12* 0.08* 0.07*  --   --   --   --   --   < > = values in this interval not displayed.  Estimated Creatinine Clearance: 105.5 mL/min (by C-G formula based on Cr of 1.02).  Assessment: 31 YOM with hx CLL who presented on 6/15 s/p fall with left arm pain, rhabdo, and rectal bleeding. Rectal bleeding since resolved. The patient was noted to have SOB and increased WOB with CTA on 6/18 showing PE. Pharmacy has been consulted for heparin dosing for PE and LUE DVT.   Heparin level is 0.2, subtherapeutic this evening on 2700 units/hr, no issues with infusion, no bleeding noted.    Goal of Therapy:  Heparin level 0.3-0.7 units/ml Monitor platelets by anticoagulation protocol: Yes   Plan:   Increase heparin rate to 2900 units/hr.  F/u AM labs  Daily heparin level and CBC while on heparin.  Monitor for any bleeding.   Maryanna Shape, PharmD, BCPS  Clinical Pharmacist  Pager: [phone]   03/11/2016,8:43 PM

## 2016-03-11 NOTE — Progress Notes (Signed)
PROGRESS NOTE                                                                                                                                                                                                             Patient Demographics:    Dale George, is a 70 y.o. male, DOB - July 21, 1946, TD:[id number]  Admit date - 03/05/2016   Admitting Physician Ivor Costa, MD  Outpatient Primary MD for the patient is Vena Austria, MD  LOS - 6  Outpatient Specialists: Oncology (Dr. Alvy Bimler) Neurology (Dr. Rexene Alberts)  Chief Complaint  Patient presents with  . 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 Patient called EMS who found him on the floor and complained of left arm pain. He was also found to have about 200 mL bright red blood per rectum. In the ED he had WBC of 32K, temperature 99.8 F, normal hemoglobin, platelets of 101, lactate of 2.3 and CK of 2500. He was tachycardic and tachypnea with potassium of 7.5 (repeated of 4.7). UA and chest x-ray unremarkable. Head CT and cervical spine negative for acute fracture or abnormality. Patient admitted for acute rhabdomyolysis . Hospital course Complicated due to development of acute PE and left upper extremity DVT   Subjective:   Hemodynamically stable overnight. Complains of some pain in his left arm. Failed swallow eval.   Assessment  & Plan :    Principal Problem:  Acute bilateral PE with mobile left upper extremity DVT -Sudden episode of tachycardia, tachypnea with elevated d-dimer on 6/18.  moderate burden of pulmonary emboli R >L small infarct or posterior right lower lobe on chest CT . -Doppler left upper extremity shows acute  mobile DVT involving the subclavian vein, acute DVT involving left axillary and left brachial veins along with acute superficial vein thrombosis in the left basal he can left cephalic vein. -Lower Doppler was technically limited as patient unable to lie flat due to shortness of breath. Cannot rule out DVT in bilateral femoral vein. -started on IV heparin drip. Noted for some drop in his baseline platelets possibly due to platelet consumption from acute thrombosis.. -Spoke  with patient's hematologist Dr. Alvy Bimler on the phone. Recommended  okay to use anticoagulation as long as platelets >50. Recommended no significant role for thrombolysis or IVC filter. -Added low dose beta blocker. -Check 2-D echo to rule out heart strain. -Currently stable on telemetry.   Sepsis with Acute rhabdomyolysis Secondary to fall and possible dehydration.sepsis resolved. CPK improving with IV fluids. (2500-->750). Statin discontinued. Seen by PT and recommend skilled nursing facility.  Active Problems:  elevated troponin Possibly due to acute PE. No chest pain symptoms. EKG normal.     Acute on chronic kidney injury stage III Mild. Resolved with IV fluids.  Generalized weakness with frequent falls Need skilled nursing facility upon discharge.  Multiple wounds with skin abrasion in bilateral feet knee, hip and flank Appreciate wound care evaluation.  Chronic Thrombocytopenia Baseline of around the low 100s, secondary to CLL. Noted for drop  In platelets to 70s this admission. Could be due to consumption from acute thrombosis. Monitor closely      CLL (chronic lymphocytic leukemia) (HCC) Chronically elevated WBC. Follows with Dr. Alvy Bimler     Rectal bleeding and mild hematuria   H&H stable. Monitor.      Transfer to stepdown unit if hemodynamically unstable.   Code Status : DO NOT RESUSCITATE (confirmed with daughter)  Family Communication  : Spoke with daughter Caryl Pina on the  phone  Disposition Plan  : SNF once stable.  Barriers For Discharge : Acute PE  Consults  :   Dr. Alvy Bimler will see patient on /621  Procedures  : CT head and cervical spine  DVT Prophylaxis  : IV heparin  Lab Results  Component Value Date   PLT 63* 03/11/2016    Antibiotics  :    Anti-infectives    Start     Dose/Rate Route Frequency Ordered Stop   03/06/16 1200  vancomycin (VANCOCIN) 1,250 mg in sodium chloride 0.9 % 250 mL IVPB  Status:  Discontinued     1,250 mg 166.7 mL/hr over 90 Minutes Intravenous Every 12 hours 03/05/16 2317 03/06/16 1059   03/06/16 0600  piperacillin-tazobactam (ZOSYN) IVPB 3.375 g  Status:  Discontinued     3.375 g 12.5 mL/hr over 240 Minutes Intravenous Every 8 hours 03/05/16 2317 03/06/16 1059   03/05/16 2330  piperacillin-tazobactam (ZOSYN) IVPB 3.375 g     3.375 g 100 mL/hr over 30 Minutes Intravenous STAT 03/05/16 2317 03/06/16 0033   03/05/16 2330  vancomycin (VANCOCIN) 2,500 mg in sodium chloride 0.9 % 500 mL IVPB  Status:  Discontinued     2,500 mg 250 mL/hr over 120 Minutes Intravenous Every 12 hours 03/05/16 2317 03/05/16 2318   03/05/16 2330  vancomycin (VANCOCIN) 2,500 mg in sodium chloride 0.9 % 500 mL IVPB     2,500 mg 250 mL/hr over 120 Minutes Intravenous  Once 03/05/16 2318 03/06/16 0203        Objective:   Filed Vitals:   03/10/16 2021 03/11/16 0525 03/11/16 0836 03/11/16 1141  BP: 131/80 111/75 122/71 131/79  Pulse: 101 79    Temp: 97.7 F (36.5 C) 98.7 F (37.1 C) 97.5 F (36.4 C)   TempSrc: Oral Oral Oral   Resp: 20 18 18 18   Height:      Weight:      SpO2: 98% 94% 96%     Wt Readings from Last 3 Encounters:  03/09/16 131.77 kg (290 lb 8 oz)  12/20/15 150.141 kg (331 lb)  06/26/15 154.631 kg (340 lb 14.4 oz)  Intake/Output Summary (Last 24 hours) at 03/11/16 1317 Last data filed at 03/11/16 0905  Gross per 24 hour  Intake 1192.02 ml  Output    800 ml  Net 392.02 ml     Physical Exam  Gen:  appears fatigued. Not in distress. HEENT: moist mucosa, supple neck Chest: clear b/l, no added sounds CVS:  S1 and S2 normal, no murmurs I: soft, NT, ND, BS+ Musculoskeletal:  swelling in  left upper extremity with tenderness NS: Alert and oriented, nonfocal    Data Review:    CBC  Recent Labs Lab 03/05/16 1909  03/07/16 0816 03/08/16 0540 03/09/16 1532 03/10/16 0428 03/11/16  WBC 132.2*  < > 93.3* 98.1* 113.6* 109.6* 99.9*  HGB 15.8  < > 13.6 13.9 13.7 13.3 12.8*  HCT 49.0  < > 43.0 43.0 42.7 41.7 41.5  PLT 101*  < > 70* 82* 72* 67* 63*  MCV 82.6  < > 83.7 82.4 82.6 82.6 84.5  MCH 26.6  < > 26.5 26.6 26.5 26.3 26.1  MCHC 32.2  < > 31.6 32.3 32.1 31.9 30.8  RDW 15.1  < > 15.7* 15.7* 15.3 15.5 15.7*  LYMPHSABS 117.7*  --  62.5* 87.3*  --   --   --   MONOABS 2.6*  --  0.0* 2.0*  --   --   --   EOSABS 0.0  --  0.0 0.0  --   --   --   BASOSABS 0.0  --  0.0 0.0  --   --   --   < > = values in this interval not displayed.  Chemistries   Recent Labs Lab 03/05/16 1909 03/05/16 2054  03/06/16 0724 03/06/16 1549 03/07/16 0816 03/08/16 0540 03/09/16 1532  NA 132* 132*  < > 136 138 141 135 137  K >7.5* 7.4*  < > 6.1* 5.5* 5.3* 4.5 4.6  CL 102 104  < > 105 110 111 103 105  CO2 21* 20*  < > 22 24 22  21* 23  GLUCOSE 136* 122*  < > 105* 108* 93 100* 127*  BUN 19 21*  < > 19 22* 16 13 15   CREATININE 1.33* 1.38*  < > 1.15 1.41* 1.07 1.00 1.02  CALCIUM 8.7* 8.7*  < > 7.8* 8.1* 8.4* 8.1* 8.2*  AST 95* 96*  --   --   --   --   --   --   ALT 55 53  --   --   --   --   --   --   ALKPHOS 95 93  --   --   --   --   --   --   BILITOT 1.7* 1.8*  --   --   --   --   --   --   < > = values in this interval not displayed. ------------------------------------------------------------------------------------------------------------------ No results for input(s): CHOL, HDL, LDLCALC, TRIG, CHOLHDL, LDLDIRECT in the last 72 hours.  No results found for:  HGBA1C ------------------------------------------------------------------------------------------------------------------ No results for input(s): TSH, T4TOTAL, T3FREE, THYROIDAB in the last 72 hours.  Invalid input(s): FREET3 ------------------------------------------------------------------------------------------------------------------ No results for input(s): VITAMINB12, FOLATE, FERRITIN, TIBC, IRON, RETICCTPCT in the last 72 hours.  Coagulation profile  Recent Labs Lab 03/06/16 0112  INR 1.41     Recent Labs  03/09/16 1532  DDIMER 12.34*    Cardiac Enzymes  Recent Labs Lab 03/09/16 1532 03/09/16 2225 03/10/16 0428  TROPONINI 0.12* 0.08* 0.07*   ------------------------------------------------------------------------------------------------------------------ No results  found for: BNP  Inpatient Medications  Scheduled Meds: . metoprolol  5 mg Intravenous Q6H  . nystatin ointment   Topical BID  . sodium chloride flush  3 mL Intravenous Q12H   Continuous Infusions: . dextrose 5 % and 0.9% NaCl 50 mL/hr at 03/10/16 1616  . heparin 2,700 Units/hr (03/11/16 0813)   PRN Meds:.acetaminophen **OR** acetaminophen, morphine injection, nitroGLYCERIN, ondansetron **OR** ondansetron (ZOFRAN) IV  Micro Results Recent Results (from the past 240 hour(s))  Culture, blood (routine x 2)     Status: Abnormal   Collection Time: 03/05/16  6:30 PM  Result Value Ref Range Status   Specimen Description BLOOD RIGHT ANTECUBITAL  Final   Special Requests BOTTLES DRAWN AEROBIC AND ANAEROBIC 5CC  Final   Culture  Setup Time   Final    GRAM POSITIVE COCCI IN CLUSTERS ANAEROBIC BOTTLE ONLY CRITICAL RESULT CALLED TO, READ BACK BY AND VERIFIED WITH: C ROBERTSON PHARMD 1818 03/06/16 A BROWNING    Culture (A)  Final    STAPHYLOCOCCUS SPECIES (COAGULASE NEGATIVE) THE SIGNIFICANCE OF ISOLATING THIS ORGANISM FROM A SINGLE SET OF BLOOD CULTURES WHEN MULTIPLE SETS ARE DRAWN IS UNCERTAIN.  PLEASE NOTIFY THE MICROBIOLOGY DEPARTMENT WITHIN ONE WEEK IF SPECIATION AND SENSITIVITIES ARE REQUIRED.    Report Status 03/08/2016 FINAL  Final  Blood Culture ID Panel (Reflexed)     Status: Abnormal   Collection Time: 03/05/16  6:30 PM  Result Value Ref Range Status   Enterococcus species NOT DETECTED NOT DETECTED Final   Vancomycin resistance NOT DETECTED NOT DETECTED Final   Listeria monocytogenes NOT DETECTED NOT DETECTED Final   Staphylococcus species DETECTED (A) NOT DETECTED Final    Comment: CRITICAL RESULT CALLED TO, READ BACK BY AND VERIFIED WITH: C ROBERTSON PHARMD 1818 03/06/16 A BROWNING    Staphylococcus aureus NOT DETECTED NOT DETECTED Final   Methicillin resistance DETECTED (A) NOT DETECTED Final    Comment: CRITICAL RESULT CALLED TO, READ BACK BY AND VERIFIED WITH: C ROBERTSON PHARMD 1818 03/06/16 A BROWNING    Streptococcus species NOT DETECTED NOT DETECTED Final   Streptococcus agalactiae NOT DETECTED NOT DETECTED Final   Streptococcus pneumoniae NOT DETECTED NOT DETECTED Final   Streptococcus pyogenes NOT DETECTED NOT DETECTED Final   Acinetobacter baumannii NOT DETECTED NOT DETECTED Final   Enterobacteriaceae species NOT DETECTED NOT DETECTED Final   Enterobacter cloacae complex NOT DETECTED NOT DETECTED Final   Escherichia coli NOT DETECTED NOT DETECTED Final   Klebsiella oxytoca NOT DETECTED NOT DETECTED Final   Klebsiella pneumoniae NOT DETECTED NOT DETECTED Final   Proteus species NOT DETECTED NOT DETECTED Final   Serratia marcescens NOT DETECTED NOT DETECTED Final   Carbapenem resistance NOT DETECTED NOT DETECTED Final   Haemophilus influenzae NOT DETECTED NOT DETECTED Final   Neisseria meningitidis NOT DETECTED NOT DETECTED Final   Pseudomonas aeruginosa NOT DETECTED NOT DETECTED Final   Candida albicans NOT DETECTED NOT DETECTED Final   Candida glabrata NOT DETECTED NOT DETECTED Final   Candida krusei NOT DETECTED NOT DETECTED Final   Candida  parapsilosis NOT DETECTED NOT DETECTED Final   Candida tropicalis NOT DETECTED NOT DETECTED Final  Culture, blood (Routine X 2) w Reflex to ID Panel     Status: None   Collection Time: 03/05/16  8:35 PM  Result Value Ref Range Status   Specimen Description BLOOD RIGHT HAND  Final   Special Requests IN PEDIATRIC BOTTLE 2CC  Final   Culture NO GROWTH 5 DAYS  Final   Report  Status 03/10/2016 FINAL  Final  Urine culture     Status: None   Collection Time: 03/05/16 11:27 PM  Result Value Ref Range Status   Specimen Description URINE, CATHETERIZED  Final   Special Requests Normal  Final   Culture NO GROWTH  Final   Report Status 03/07/2016 FINAL  Final    Radiology Reports Dg Chest 2 View  03/05/2016  CLINICAL DATA:  Found lying on the floor with bright red bleeding from rectum. Fell yesterday. EXAM: CHEST  2 VIEW COMPARISON:  03/29/2015 FINDINGS: The lungs are clear without airspace disease or pulmonary edema. Heart and mediastinum are within normal limits. Negative for a pneumothorax. Lateral view is limited because the arms are not elevated. No large pleural effusions. Bony thorax is grossly intact. Degenerative changes at the right [facility] joint. IMPRESSION: No acute chest abnormality. Electronically Signed   By: Markus Daft M.D.   On: 03/05/2016 19:57   Ct Head Wo Contrast  03/05/2016  CLINICAL DATA:  Unwitnessed fall. EXAM: CT HEAD WITHOUT CONTRAST CT CERVICAL SPINE WITHOUT CONTRAST TECHNIQUE: Multidetector CT imaging of the head and cervical spine was performed following the standard protocol without intravenous contrast. Multiplanar CT image reconstructions of the cervical spine were also generated. COMPARISON:  03/29/2015 head and cervical spine CT. FINDINGS: CT HEAD FINDINGS No evidence of parenchymal hemorrhage or extra-axial fluid collection. No mass lesion, mass effect, or midline shift. No CT evidence of acute infarction. Mild intracranial atherosclerosis. Nonspecific moderate subcortical  and periventricular white matter hypodensity, most in keeping with chronic small vessel ischemic change. Generalized cerebral volume loss. Cerebral ventricle sizes are stable and concordant with the degree of cerebral volume loss. Mucoperiosteal thickening throughout the visualized left maxillary sinus with associated small fluid level. Mucoperiosteal thickening throughout the dependent frontal sinus, sphenoid sinus and bilateral ethmoidal air cells. Small right greater than left mastoid effusions. No evidence of calvarial fracture. CT CERVICAL SPINE FINDINGS No fracture is detected in the cervical spine. 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 No prevertebral soft tissue swelling. Normal cervical lordosis. Dens is well positioned between the lateral masses of C1. No acute facet subluxation. Marked degenerative disc disease throughout the cervical spine, most prominent at C6-7 with bulky anterior marginal osteophytes at C3-4. Posterior disc bulge at C3-4 effaces the anterior thecal sac with no definite cord flattening, unchanged. Severe bilateral facet arthropathy. Moderate to severe foraminal stenosis on the left at C3-4. Severe bilateral foraminal stenosis at C4-5. Stable 4 mm anterolisthesis at C6-7. No acute cervical spine subluxation. Small bilateral inferior mastoid effusions. No evidence of intra-axial hemorrhage in the visualized brain. No gross cervical canal hematoma. No significant pulmonary nodules at the visualized lung apices. No cervical adenopathy or other significant neck soft tissue abnormality. IMPRESSION: 1. No evidence of acute intracranial abnormality. No evidence of calvarial fracture. 2. Generalized cerebral volume loss and moderate chronic small vessel ischemia. 3. No cervical spine fracture or acute malalignment. 4. 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 Stable 4 mm  anterolisthesis at C6-7. 5. Severe degenerative changes in the cervical spine as described. 6. Paranasal sinusitis, possibly acute. 7. Small bilateral mastoid effusions. Electronically Signed   By: Ilona Sorrel M.D.   On: 03/05/2016 20:47   Ct Angio Chest Pe W Or Wo Contrast  03/09/2016  ADDENDUM REPORT: 03/09/2016 22:06 ADDENDUM: Critical Value/emergent results were called by telephone at the time of interpretation on 03/09/2016 at 9:52  p.m. to Dr. Pincus Sanes, who verbally acknowledged these results. Electronically Signed   By: Marin Olp M.D.   On: 03/09/2016 22:06  03/09/2016  CLINICAL DATA:  Shortness of breath. EXAM: CT ANGIOGRAPHY CHEST WITH CONTRAST TECHNIQUE: Multidetector CT imaging of the chest was performed using the standard protocol during bolus administration of intravenous contrast. Multiplanar CT image reconstructions and MIPs were obtained to evaluate the vascular anatomy. CONTRAST:  80 mL Isovue 370 IV. COMPARISON:  Chest x-ray today. FINDINGS: Lungs are well inflated with minimal pleural based opacification over the posterior right lower lobe likely atelectasis versus infarct. No effusion. Airways are within normal. Heart is normal size. Thoracic aorta is within normal. Pulmonary arterial system is well opacified and demonstrates multiple bilateral pulmonary emboli right worse than left. Most of the emboli are over the distal right main pulmonary artery and proximal right lower lobar arteries. Emboli throughout the right upper and middle lobe arteries as well as the lingular artery. RV/LV ratio is 0.8. No significant hilar or mediastinal adenopathy. Remaining mediastinal structures are within normal. Mild stranding of the fat in the left axilla adjacent the axillary vessels. Images through the upper abdomen demonstrate mild splenomegaly. Mild degenerate change of the spine. Review of the MIP images confirms the above findings. IMPRESSION: Moderate burden of pulmonary emboli right worse  than left as described. RV/LV ratio 0.8 which is within normal. Small infarct over the posterior right lower lobe. Mild stranding of the fat in the left axilla adjacent the axillary vessels as cannot exclude left upper extremity venous thrombus. Recommend clinical correlation. Splenomegaly. Electronically Signed: By: Marin Olp M.D. On: 03/09/2016 21:42   Ct Cervical Spine Wo Contrast  03/05/2016  CLINICAL DATA:  Unwitnessed fall. EXAM: CT HEAD WITHOUT CONTRAST CT CERVICAL SPINE WITHOUT CONTRAST TECHNIQUE: Multidetector CT imaging of the head and cervical spine was performed following the standard protocol without intravenous contrast. Multiplanar CT image reconstructions of the cervical spine were also generated. COMPARISON:  03/29/2015 head and cervical spine CT. FINDINGS: CT HEAD FINDINGS No evidence of parenchymal hemorrhage or extra-axial fluid collection. No mass lesion, mass effect, or midline shift. No CT evidence of acute infarction. Mild intracranial atherosclerosis. Nonspecific moderate subcortical and periventricular white matter hypodensity, most in keeping with chronic small vessel ischemic change. Generalized cerebral volume loss. Cerebral ventricle sizes are stable and concordant with the degree of cerebral volume loss. Mucoperiosteal thickening throughout the visualized left maxillary sinus with associated small fluid level. Mucoperiosteal thickening throughout the dependent frontal sinus, sphenoid sinus and bilateral ethmoidal air cells. Small right greater than left mastoid effusions. No evidence of calvarial fracture. CT CERVICAL SPINE FINDINGS No fracture is detected in the cervical spine. 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 No prevertebral soft tissue swelling. Normal cervical lordosis. Dens is well positioned between the lateral masses of C1. No acute facet subluxation. Marked degenerative disc  disease throughout the cervical spine, most prominent at C6-7 with bulky anterior marginal osteophytes at C3-4. Posterior disc bulge at C3-4 effaces the anterior thecal sac with no definite cord flattening, unchanged. Severe bilateral facet arthropathy. Moderate to severe foraminal stenosis on the left at C3-4. Severe bilateral foraminal stenosis at C4-5. Stable 4 mm anterolisthesis at C6-7. No acute cervical spine subluxation. Small bilateral inferior mastoid effusions. No evidence of intra-axial hemorrhage in the visualized brain. No gross cervical canal hematoma. No significant pulmonary nodules at the visualized lung apices. No cervical adenopathy or other  significant neck soft tissue abnormality. IMPRESSION: 1. No evidence of acute intracranial abnormality. No evidence of calvarial fracture. 2. Generalized cerebral volume loss and moderate chronic small vessel ischemia. 3. No cervical spine fracture or acute malalignment. 4. 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 Stable 4 mm anterolisthesis at C6-7. 5. Severe degenerative changes in the cervical spine as described. 6. Paranasal sinusitis, possibly acute. 7. Small bilateral mastoid effusions. Electronically Signed   By: Ilona Sorrel M.D.   On: 03/05/2016 20:47   Dg Chest Port 1 View  03/09/2016  CLINICAL DATA:  Patient with shortness of breath. EXAM: PORTABLE CHEST 1 VIEW COMPARISON:  Chest radiograph 03/05/2016. FINDINGS: Multiple monitoring leads overlie the patient. Stable cardiomegaly. Tortuosity of the thoracic aorta. Pulmonary vascular redistribution. No large area of pulmonary consolidation. No pleural effusion pneumothorax. IMPRESSION: Cardiomegaly with pulmonary vascular redistribution. No acute cardiopulmonary process. Electronically Signed   By: Lovey Newcomer M.D.   On: 03/09/2016 14:43   Dg Humerus Left  03/05/2016  CLINICAL DATA:  [place] yesterday.  Posterior elbow pain. EXAM: LEFT HUMERUS - 2+ VIEW COMPARISON:   None. FINDINGS: The left humerus appears intact. No evidence of acute fracture or dislocation. No focal bone lesion or bone destruction. Soft tissues are unremarkable. Note that the elbow is not optimally evaluated on humeral films. If there is suspicion of elbow injury, elbow series should be obtained. IMPRESSION: Negative. Electronically Signed   By: Lucienne Capers M.D.   On: 03/05/2016 23:58    Time Spent in minutes 35   Louellen Molder M.D on 03/11/2016 at 1:17 PM  Between 7am to 7pm - Pager - [phone]  After 7pm go to [email or web address] - password [facility]  [facility] -  Office  [phone]

## 2016-03-12 ENCOUNTER — Inpatient Hospital Stay (HOSPITAL_COMMUNITY): Payer: Medicare PPO

## 2016-03-12 DIAGNOSIS — I82622 Acute embolism and thrombosis of deep veins of left upper extremity: Secondary | ICD-10-CM

## 2016-03-12 DIAGNOSIS — K625 Hemorrhage of anus and rectum: Secondary | ICD-10-CM

## 2016-03-12 DIAGNOSIS — D696 Thrombocytopenia, unspecified: Secondary | ICD-10-CM

## 2016-03-12 DIAGNOSIS — L8990 Pressure ulcer of unspecified site, unspecified stage: Secondary | ICD-10-CM

## 2016-03-12 DIAGNOSIS — R1310 Dysphagia, unspecified: Secondary | ICD-10-CM

## 2016-03-12 DIAGNOSIS — M6282 Rhabdomyolysis: Secondary | ICD-10-CM

## 2016-03-12 DIAGNOSIS — C9110 Chronic lymphocytic leukemia of B-cell type not having achieved remission: Secondary | ICD-10-CM

## 2016-03-12 DIAGNOSIS — I2699 Other pulmonary embolism without acute cor pulmonale: Principal | ICD-10-CM

## 2016-03-12 DIAGNOSIS — N183 Chronic kidney disease, stage 3 (moderate): Secondary | ICD-10-CM

## 2016-03-12 LAB — HEPARIN LEVEL (UNFRACTIONATED)
Heparin Unfractionated: 0.47 IU/mL (ref 0.30–0.70)
Heparin Unfractionated: 0.88 IU/mL — ABNORMAL HIGH (ref 0.30–0.70)
Heparin Unfractionated: 0.7 IU/mL (ref 0.30–0.70)

## 2016-03-12 LAB — CBC
HCT: 44.1 % (ref 39.0–52.0)
Hemoglobin: 13.5 g/dL (ref 13.0–17.0)
MCH: 26.4 pg (ref 26.0–34.0)
MCHC: 30.6 g/dL (ref 30.0–36.0)
MCV: 86.1 fL (ref 78.0–100.0)
Platelets: 68 10*3/uL — ABNORMAL LOW (ref 150–400)
RBC: 5.12 MIL/uL (ref 4.22–5.81)
RDW: 15.7 % — AB (ref 11.5–15.5)
WBC: 97.1 10*3/uL — AB (ref 4.0–10.5)

## 2016-03-12 MED ORDER — IPRATROPIUM-ALBUTEROL 0.5-2.5 (3) MG/3ML IN SOLN: 3.0000 mL | Freq: Four times a day (QID) | RESPIRATORY_TRACT | Status: DC | PRN

## 2016-03-12 MED ORDER — FUROSEMIDE 10 MG/ML IJ SOLN
20.0000 mg | Freq: Once | INTRAMUSCULAR | Status: AC
  Administered 2016-03-12: 20 mg via INTRAVENOUS
  Filled 2016-03-12: qty 2

## 2016-03-12 MED ORDER — HEPARIN (PORCINE) IN NACL 100-0.45 UNIT/ML-% IJ SOLN
2200.0000 [IU]/h | INTRAMUSCULAR | Status: AC
  Administered 2016-03-12: 2800 [IU]/h via INTRAVENOUS
  Administered 2016-03-13 (×3): 2700 [IU]/h via INTRAVENOUS
  Administered 2016-03-14: 2500 [IU]/h via INTRAVENOUS
  Administered 2016-03-14: 2200 [IU]/h via INTRAVENOUS
  Filled 2016-03-12 (×4): qty 250

## 2016-03-12 MED ORDER — IPRATROPIUM-ALBUTEROL 0.5-2.5 (3) MG/3ML IN SOLN
3.0000 mL | Freq: Four times a day (QID) | RESPIRATORY_TRACT | Status: DC
  Administered 2016-03-12: 3 mL via RESPIRATORY_TRACT
  Filled 2016-03-12: qty 3

## 2016-03-12 NOTE — Progress Notes (Signed)
ANTICOAGULATION CONSULT NOTE - Follow Up Consult  Pharmacy Consult for Heparin Indication: DVT, PE, RV thrombus  No Known Allergies  Patient Measurements: Height: 6\' 6"  (198.1 cm) Weight: (!) 308 lb (139.708 kg) IBW/kg (Calculated) : 91.4 Heparin Dosing Weight:  119.5 kg  Vital Signs: Temp: 98.2 F (36.8 C) (06/21 0959) Temp Source: Oral (06/21 0959) BP: 119/76 mmHg (06/21 0959) Pulse Rate: 95 (06/21 0959)  Labs:  Recent Labs  03/09/16 1532 03/09/16 2225 03/10/16 0428  03/11/16  03/11/16 2000 03/12/16 0549 03/12/16 0640 03/12/16 1250  HGB 13.7  --  13.3  --  12.8*  --   --  13.5  --   --   HCT 42.7  --  41.7  --  41.5  --   --  44.1  --   --   PLT 72*  --  67*  --  63*  --   --  68*  --   --   HEPARINUNFRC  --   --   --   < > 0.31  < > 0.20*  --  0.47 0.88*  CREATININE 1.02  --   --   --   --   --   --   --   --   --   CKTOTAL  --  732*  --   --   --   --   --   --   --   --   TROPONINI 0.12* 0.08* 0.07*  --   --   --   --   --   --   --   < > = values in this interval not displayed.  Estimated Creatinine Clearance: 105.5 mL/min (by C-G formula based on Cr of 1.02).  Assessment:  Anticoagulation:  6/18 pm: new PE and LUE DVT + RV thrombus on Echo- Heparin, no bolus d/t thrombocytopenia (chronic). Had some rectal bleeding on admission and reported by RN 6/19 also.None since. HL has been difficult to keep in range. HL 0.47 this AM,  Rechecked HL 6 hours later = 0.88.  RN reports that previously had Normal Saline infusing in right arm close to wear the heparin is infusing- same arm (right).  RN reports that HL likely drawn from same arm as  DVT is in left arm.   Hgb 13.5 stable. Plts= 68k   Goal of Therapy:  Heparin level 0.3-0.7 units/ml Monitor platelets by anticoagulation protocol: Yes   Plan:  Decrease heparin drip to 2800 units/hr- Check HL in 6 hrs.  Monitor for blood in the urine  Low PLTC, but Heme/onc says continue Heparin as long as PLTC > [place], RPh Clinical Pharmacist Pager: [phone] 03/12/2016,1:56 PM

## 2016-03-12 NOTE — Progress Notes (Signed)
ANTICOAGULATION CONSULT NOTE - Follow Up Consult  Pharmacy Consult for Heparin Indication: DVT, PE, RV thrombus  No Known Allergies  Patient Measurements: Height: 6\' 6"  (198.1 cm) Weight: (!) 308 lb (139.708 kg) IBW/kg (Calculated) : 91.4 Heparin Dosing Weight:  119.5 kg  Vital Signs: Temp: 99 F (37.2 C) (06/21 1800) Temp Source: Oral (06/21 1800) BP: 123/73 mmHg (06/21 1800) Pulse Rate: 109 (06/21 1800)  Labs:  Recent Labs  03/09/16 2225  03/10/16 0428  03/11/16  03/12/16 0549 03/12/16 0640 03/12/16 1250 03/12/16 2025  HGB  --   < > 13.3  --  12.8*  --  13.5  --   --   --   HCT  --   --  41.7  --  41.5  --  44.1  --   --   --   PLT  --   --  67*  --  63*  --  68*  --   --   --   HEPARINUNFRC  --   --   --   < > 0.31  < >  --  0.47 0.88* 0.70  CKTOTAL 732*  --   --   --   --   --   --   --   --   --   TROPONINI 0.08*  --  0.07*  --   --   --   --   --   --   --   < > = values in this interval not displayed.  Estimated Creatinine Clearance: 105.5 mL/min (by C-G formula based on Cr of 1.02).  Assessment:  Anticoagulation:  6/18 pm: new PE and LUE DVT + RV thrombus on Echo- Heparin, no bolus d/t thrombocytopenia (chronic). Had some rectal bleeding on admission and reported by RN 6/19 also.None since. HL has been difficult to keep in range. HL 0.47 this AM,  Rechecked HL 6 hours later = 0.88.  RN reports that previously had Normal Saline infusing in right arm close to wear the heparin is infusing- same arm (right).  RN reports that HL likely drawn from same arm as  DVT is in left arm.   Hgb 13.5 stable. Plts= 68k   Goal of Therapy:  Heparin level 0.3-0.7 units/ml Monitor platelets by anticoagulation protocol: Yes   Plan:  Decrease heparin drip to 2700 units/hr- Follow up AM level  Monitor for blood in the urine  Low PLTC, but Heme/onc says continue Heparin as long as PLTC > 50  Thank you Anette Guarneri, PharmD [phone] 03/12/2016,9:12 PM

## 2016-03-12 NOTE — Progress Notes (Signed)
[facility] Progress Note  Patient: Dale George [id number]   PCP: Vena Austria, MD DOB: 11-24-1945   DOA: 03/05/2016   DOS: 03/12/2016   Date of Service: the patient was seen and examined on 03/12/2016  Subjective: The patient is requesting water. Denies any acute complaint. No shortness of breath and nausea and vomiting. Has persistent cough. No abdominal pain no diarrhea no bleeding no focal deficits reported by patient Nutrition: nothing by mouth until speech clears the patient  Brief hospital course: Pt. with PMH of CLL, tongue cancer, CKD 3, dementia, HTN, gout; admitted on 03/05/2016, with complaint of fall as well as rectal bleeding with left arm pain, was found to have acute rhabdomyolysis, during the course the patient was found to have left upper extremity DVT, bilateral PE as well as bilateral femoral vein DVT and RV thrombus without any RV dysfunction. Patient has thrombocytopenia and after discussion with hematology patient was started on heparin and at present no evidence of bleeding reported. Patient has chronic dysphagia but during the hospitalization there was concern regarding patient swallowing and therefore speech therapy consult was placed and patient remains nothing by mouth since then. Currently further plan is continue anticoagulation.  Assessment and Plan: 1. Acute pulmonary embolism (HCC) Bilateral PE along with bilateral femur oral DVT along with left upper extremity DVT as well as a right ventricle thrombus. 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 On heparin and no evidence of active bleeding. Discussed with Dr. Alvy Bimler again today on 03/12/2016, as long as platelets are more than 50 continue anticoagulation. Can transition to oral anticoagulation once able to tolerate orally. We'll continue to closely monitor.  2. Acute on chronic diastolic dysfunction. Possible bronchiolitis.  Patient has bilateral expiratory wheezing,  chest x-ray shows vascular congestion. Possible acute on chronic diastolic dysfunction and therefore I will give the patient IV Lasix. We'll also use DuoNeb's scheduled. The patient does not show any evidence of improvement this can also be severe bronchiolitis. We'll reevaluate on 03/13/2016 and decide regarding antibiotics as well as steroids.  3. Rhabdomyolysis. Improving with IV hydration. Probable volume overload from IV hydration. We'll continue to monitor.  4. Rectal bleeding. Patient presented with rectal bleeding but at present his FOBT is negative. I will continue to closely monitor while the patient is on anticoagulation.  5. Prior history of tongue cancer. Dysphagia. No evidence of focal deficit at present. CT head on admission was unremarkable. Speech therapy consulted and currently patient remain nothing by mouth. At present I expect that the patient should be able to take dysphagia type I diet ending speech therapy consultation as the patient has adequate cough reflex. We'll continue to monitor for speech therapy recommendation.  Pain management: When necessary Tylenol Activity: SNF as per physical therapy Bowel regimen: last BM 03/10/2016 Diet: Currently remains nothing by mouth  Advance goals of care discussion: DNR/DNI  Family Communication: family was present at bedside, at the time of interview. The pt provided permission to discuss medical plan with the family. Opportunity was given to ask question and all questions were answered satisfactorily.   Disposition:  Discharge to SNF. Expected discharge date: 03/15/2016, pending speech therapy evaluation as well as improvement in respiratory status  Consultants: Hematology Procedures: Echocardiogram  Antibiotics: Anti-infectives    Start     Dose/Rate Route Frequency Ordered Stop   03/06/16 1200  vancomycin (VANCOCIN) 1,250 mg in sodium chloride 0.9 % 250 mL IVPB  Status:  Discontinued     1,250 mg  166.7  mL/hr over 90 Minutes Intravenous Every 12 hours 03/05/16 2317 03/06/16 1059   03/06/16 0600  piperacillin-tazobactam (ZOSYN) IVPB 3.375 g  Status:  Discontinued     3.375 g 12.5 mL/hr over 240 Minutes Intravenous Every 8 hours 03/05/16 2317 03/06/16 1059   03/05/16 2330  piperacillin-tazobactam (ZOSYN) IVPB 3.375 g     3.375 g 100 mL/hr over 30 Minutes Intravenous STAT 03/05/16 2317 03/06/16 0033   03/05/16 2330  vancomycin (VANCOCIN) 2,500 mg in sodium chloride 0.9 % 500 mL IVPB  Status:  Discontinued     2,500 mg 250 mL/hr over 120 Minutes Intravenous Every 12 hours 03/05/16 2317 03/05/16 2318   03/05/16 2330  vancomycin (VANCOCIN) 2,500 mg in sodium chloride 0.9 % 500 mL IVPB     2,500 mg 250 mL/hr over 120 Minutes Intravenous  Once 03/05/16 2318 03/06/16 0203        Intake/Output Summary (Last 24 hours) at 03/12/16 1837 Last data filed at 03/12/16 [id number]  Gross per 24 hour  Intake      0 ml  Output   1100 ml  Net  -1100 ml   Filed Weights   03/07/16 2105 03/09/16 1950 03/11/16 2026  Weight: 131.77 kg (290 lb 8 oz) 131.77 kg (290 lb 8 oz) 139.708 kg (308 lb)    Objective: Physical Exam: Filed Vitals:   03/11/16 2026 03/12/16 0621 03/12/16 0959 03/12/16 1444  BP: 128/86 154/85 119/76   Pulse: 83 108 95 95  Temp: 97.9 F (36.6 C) 98.5 F (36.9 C) 98.2 F (36.8 C)   TempSrc: Oral Oral Oral   Resp: 19 20 20 20   Height:      Weight: 139.708 kg (308 lb)     SpO2: 95% 99% 96% 95%    General: Alert, Awake and Oriented to Place and Person. Appear in moderate distress Eyes: PERRL, Conjunctiva normal ENT: Oral Mucosa clear dry. Neck: no JVD, no Abnormal Mass Or lumps Cardiovascular: S1 and S2 Present, aortic systolic Murmur, Respiratory: Bilateral Air entry equal and Decreased, basal Crackles, bilateral wheezes Abdomen: Bowel Sound present, Soft and no tenderness Skin: no redness, no Rash  Extremities: bilateral Pedal edema, no calf tenderness, left upper extremity  edema Neurologic: Grossly no focal neuro deficit. Bilaterally Equal motor strength  Data Reviewed: CBC:  Recent Labs Lab 03/05/16 1909  03/07/16 0816 03/08/16 0540 03/09/16 1532 03/10/16 0428 03/11/16 03/12/16 0549  WBC 132.2*  < > 93.3* 98.1* 113.6* 109.6* 99.9* 97.1*  NEUTROABS 11.9*  --  30.8* 8.8*  --   --   --   --   HGB 15.8  < > 13.6 13.9 13.7 13.3 12.8* 13.5  HCT 49.0  < > 43.0 43.0 42.7 41.7 41.5 44.1  MCV 82.6  < > 83.7 82.4 82.6 82.6 84.5 86.1  PLT 101*  < > 70* 82* 72* 67* 63* 68*  < > = values in this interval not displayed. Basic Metabolic Panel:  Recent Labs Lab 03/06/16 0112 03/06/16 0724 03/06/16 1549 03/07/16 0816 03/08/16 0540 03/09/16 1532  NA 140 136 138 141 135 137  K 6.6* 6.1* 5.5* 5.3* 4.5 4.6  CL 112* 105 110 111 103 105  CO2 22 22 24 22  21* 23  GLUCOSE 107* 105* 108* 93 100* 127*  BUN 20 19 22* 16 13 15   CREATININE 1.25* 1.15 1.41* 1.07 1.00 1.02  CALCIUM 8.7* 7.8* 8.1* 8.4* 8.1* 8.2*  PHOS 2.5  --   --   --   --   --  Liver Function Tests:  Recent Labs Lab 03/05/16 1909 03/05/16 2054  AST 95* 96*  ALT 55 53  ALKPHOS 95 93  BILITOT 1.7* 1.8*  PROT 7.8 6.7  ALBUMIN 3.8 3.7   No results for input(s): LIPASE, AMYLASE in the last 168 hours. No results for input(s): AMMONIA in the last 168 hours. Coagulation Profile:  Recent Labs Lab 03/06/16 0112  INR 1.41   Cardiac Enzymes:  Recent Labs Lab 03/05/16 1909 03/06/16 0457 03/07/16 0816 03/09/16 1532 03/09/16 2225 03/10/16 0428  CKTOTAL 2524* 1782* 769*  --  732*  --   TROPONINI  --   --   --  0.12* 0.08* 0.07*   BNP (last 3 results) No results for input(s): PROBNP in the last 8760 hours.  CBG: No results for input(s): GLUCAP in the last 168 hours.  Studies: Dg Chest Port 1 View  03/12/2016  CLINICAL DATA:  Shortness of breath.  Acute pulmonary embolism. EXAM: PORTABLE CHEST 1 VIEW COMPARISON:  03/09/2016 FINDINGS: Mild cardiomegaly remains stable. Increased  central bilateral perihilar airspace opacity is seen which appears symmetric and suspicious for mild acute pulmonary edema. No evidence of focal pulmonary consolidation or pleural effusion. IMPRESSION: Increased symmetric central perihilar airspace opacity, suspicious for mild acute pulmonary edema. Stable mild cardiomegaly. Electronically Signed   By: Earle Gell M.D.   On: 03/12/2016 12:25     Scheduled Meds: . nystatin ointment   Topical BID  . sodium chloride flush  3 mL Intravenous Q12H   Continuous Infusions: . heparin 2,800 Units/hr (03/12/16 1411)   PRN Meds: acetaminophen **OR** acetaminophen, ipratropium-albuterol, morphine injection, nitroGLYCERIN, ondansetron **OR** ondansetron (ZOFRAN) IV  Time spent: 30 minutes  Author: Berle Mull, MD [facility] Pager: [phone] 03/12/2016 6:37 PM  If 7PM-7AM, please contact night-coverage at [email or web address], password [facility]

## 2016-03-12 NOTE — Clinical Social Work Placement (Signed)
   CLINICAL SOCIAL WORK PLACEMENT  NOTE  Date:  03/12/2016  Patient Details  Name: Dale George MRN: [id number] Date of Birth: January 12, 1946  Clinical Social Work is seeking post-discharge placement for this patient at the [place] level of care (*CSW will initial, date and re-position this form in  chart as items are completed):  Yes   Patient/family provided with [facility] Work Department's list of facilities offering this level of care within the geographic area requested by the patient (or if unable, by the patient's family).  Yes   Patient/family informed of their freedom to choose among providers that offer the needed level of care, that participate in Medicare, Medicaid or managed care program needed by the patient, have an available bed and are willing to accept the patient.  Yes   Patient/family informed of 's ownership interest in [facility] and [facility], as well as of the fact that they are under no obligation to receive care at these facilities.  PASRR submitted to EDS on       PASRR number received on       Existing PASRR number confirmed on 03/11/16     FL2 transmitted to all facilities in geographic area requested by pt/family on 03/11/16     FL2 transmitted to all facilities within larger geographic area on       Patient informed that his/her managed care company has contracts with or will negotiate with certain facilities, including the following:        Yes   Patient/family informed of bed offers received.  Patient chooses bed at [facility]     Physician recommends and patient chooses bed at      Patient to be transferred to   on  .  Patient to be transferred to facility by       Patient family notified on   of transfer.  Name of family member notified:        PHYSICIAN       Additional Comment:    _______________________________________________ [name],  LCSW 03/12/2016, 2:03 PM

## 2016-03-12 NOTE — Progress Notes (Signed)
ANTICOAGULATION CONSULT NOTE - Follow Up Consult  Pharmacy Consult for Heparin Indication: DVT, PE, RV thrombus  No Known Allergies  Patient Measurements: Height: 6\' 6"  (198.1 cm) Weight: (!) 308 lb (139.708 kg) IBW/kg (Calculated) : 91.4 Heparin Dosing Weight:  119.5 kg  Vital Signs: Temp: 98.5 F (36.9 C) (06/21 0621) Temp Source: Oral (06/21 0621) BP: 154/85 mmHg (06/21 0621) Pulse Rate: 108 (06/21 0621)  Labs:  Recent Labs  03/09/16 1532 03/09/16 2225 03/10/16 0428  03/11/16  03/11/16 1209 03/11/16 2000 03/12/16 0549 03/12/16 0640  HGB 13.7  --  13.3  --  12.8*  --   --   --  13.5  --   HCT 42.7  --  41.7  --  41.5  --   --   --  44.1  --   PLT 72*  --  67*  --  63*  --   --   --  PENDING  --   HEPARINUNFRC  --   --   --   < > 0.31  < > 0.37 0.20*  --  0.47  CREATININE 1.02  --   --   --   --   --   --   --   --   --   CKTOTAL  --  732*  --   --   --   --   --   --   --   --   TROPONINI 0.12* 0.08* 0.07*  --   --   --   --   --   --   --   < > = values in this interval not displayed.  Estimated Creatinine Clearance: 105.5 mL/min (by C-G formula based on Cr of 1.02).  Assessment:  Anticoagulation:  6/18 pm: new PE and LUE DVT + RV thrombus on Echo- Heparin, no bolus d/t thrombocytopenia (chronic). Had some rectal bleeding on admission and reported by RN 6/19 also.None since. HL has been difficult to keep in range. HL 0.47. Hgb 13.5 stable. Plts pending this AM.  Goal of Therapy:  Heparin level 0.3-0.7 units/ml Monitor platelets by anticoagulation protocol: Yes   Plan:   Continue heparin drip at 2900/hr-confirm level in 6 hrs.  Monitor for blood in the urine  Low PLTC, but Heme/onc says continue Heparin as long as PLTC > 50  Eilene Ghazi [place] 03/12/2016,8:12 AM

## 2016-03-13 DIAGNOSIS — B348 Other viral infections of unspecified site: Secondary | ICD-10-CM | POA: Diagnosis present

## 2016-03-13 LAB — COMPREHENSIVE METABOLIC PANEL
ALT: 60 U/L (ref 17–63)
AST: 58 U/L — AB (ref 15–41)
Albumin: 2.3 g/dL — ABNORMAL LOW (ref 3.5–5.0)
Alkaline Phosphatase: 81 U/L (ref 38–126)
Anion gap: 10 (ref 5–15)
BILIRUBIN TOTAL: 1.5 mg/dL — AB (ref 0.3–1.2)
BUN: 23 mg/dL — AB (ref 6–20)
CALCIUM: 8.4 mg/dL — AB (ref 8.9–10.3)
CO2: 25 mmol/L (ref 22–32)
CREATININE: 1.04 mg/dL (ref 0.61–1.24)
Chloride: 109 mmol/L (ref 101–111)
GFR calc Af Amer: >60 mL/min (ref >60)
Glucose, Bld: 111 mg/dL — ABNORMAL HIGH (ref 65–99)
Potassium: 4.3 mmol/L (ref 3.5–5.1)
Sodium: 144 mmol/L (ref 135–145)
TOTAL PROTEIN: 5.6 g/dL — AB (ref 6.5–8.1)

## 2016-03-13 LAB — CBC WITH DIFFERENTIAL/PLATELET
BASOS ABS: 0 10*3/uL (ref 0.0–0.1)
Basophils Relative: 0 %
EOS ABS: 0 10*3/uL (ref 0.0–0.7)
Eosinophils Relative: 0 %
HCT: 41.1 % (ref 39.0–52.0)
Hemoglobin: 12.7 g/dL — ABNORMAL LOW (ref 13.0–17.0)
LYMPHS ABS: 78.8 10*3/uL — AB (ref 0.7–4.0)
LYMPHS PCT: 89 %
MCH: 26.5 pg (ref 26.0–34.0)
MCHC: 30.9 g/dL (ref 30.0–36.0)
MCV: 85.6 fL (ref 78.0–100.0)
MONOS PCT: 2 %
Monocytes Absolute: 1.8 10*3/uL — ABNORMAL HIGH (ref 0.1–1.0)
NEUTROS ABS: 8 10*3/uL — AB (ref 1.7–7.7)
Neutrophils Relative %: 9 %
Platelets: 70 10*3/uL — ABNORMAL LOW (ref 150–400)
RBC: 4.8 MIL/uL (ref 4.22–5.81)
RDW: 15.7 % — AB (ref 11.5–15.5)
WBC: 88.6 10*3/uL — AB (ref 4.0–10.5)

## 2016-03-13 LAB — RESPIRATORY PANEL BY PCR
Adenovirus: NOT DETECTED
BORDETELLA PERTUSSIS-RVPCR: NOT DETECTED
CHLAMYDOPHILA PNEUMONIAE-RVPPCR: NOT DETECTED
Coronavirus 229E: NOT DETECTED
Coronavirus HKU1: NOT DETECTED
Coronavirus NL63: NOT DETECTED
Coronavirus OC43: NOT DETECTED
INFLUENZA A H1-RVPPCR: NOT DETECTED
INFLUENZA A-RVPPCR: NOT DETECTED
Influenza A H1 2009: NOT DETECTED
Influenza A H3: NOT DETECTED
Influenza B: NOT DETECTED
METAPNEUMOVIRUS-RVPPCR: NOT DETECTED
Mycoplasma pneumoniae: NOT DETECTED
PARAINFLUENZA VIRUS 2-RVPPCR: NOT DETECTED
PARAINFLUENZA VIRUS 3-RVPPCR: NOT DETECTED
PARAINFLUENZA VIRUS 4-RVPPCR: NOT DETECTED
Parainfluenza Virus 1: NOT DETECTED
RESPIRATORY SYNCYTIAL VIRUS-RVPPCR: NOT DETECTED
RHINOVIRUS / ENTEROVIRUS - RVPPCR: DETECTED — AB

## 2016-03-13 LAB — PROTIME-INR
INR: 2.06 — AB (ref 0.00–1.49)
PROTHROMBIN TIME: 23 s — AB (ref 11.6–15.2)

## 2016-03-13 LAB — MAGNESIUM: MAGNESIUM: 2.2 mg/dL (ref 1.7–2.4)

## 2016-03-13 LAB — HEPARIN LEVEL (UNFRACTIONATED): Heparin Unfractionated: 0.45 IU/mL (ref 0.30–0.70)

## 2016-03-13 MED ORDER — LEVOFLOXACIN IN D5W 750 MG/150ML IV SOLN
750.0000 mg | INTRAVENOUS | Status: DC
  Administered 2016-03-13 – 2016-03-14 (×2): 750 mg via INTRAVENOUS
  Filled 2016-03-13 (×2): qty 150

## 2016-03-13 MED ORDER — IPRATROPIUM BROMIDE 0.02 % IN SOLN
0.5000 mg | Freq: Four times a day (QID) | RESPIRATORY_TRACT | Status: DC
  Administered 2016-03-13 – 2016-03-14 (×5): 0.5 mg via RESPIRATORY_TRACT
  Filled 2016-03-13 (×5): qty 2.5

## 2016-03-13 MED ORDER — METOPROLOL TARTRATE 5 MG/5ML IV SOLN
5.0000 mg | Freq: Three times a day (TID) | INTRAVENOUS | Status: DC
  Administered 2016-03-13 – 2016-03-14 (×4): 5 mg via INTRAVENOUS
  Filled 2016-03-13 (×4): qty 5

## 2016-03-13 MED ORDER — METHYLPREDNISOLONE SODIUM SUCC 125 MG IJ SOLR
60.0000 mg | Freq: Two times a day (BID) | INTRAMUSCULAR | Status: DC
  Administered 2016-03-13 – 2016-03-14 (×3): 60 mg via INTRAVENOUS
  Filled 2016-03-13 (×3): qty 2

## 2016-03-13 MED ORDER — LEVALBUTEROL HCL 0.63 MG/3ML IN NEBU
0.6300 mg | INHALATION_SOLUTION | Freq: Four times a day (QID) | RESPIRATORY_TRACT | Status: DC
  Administered 2016-03-13 – 2016-03-14 (×5): 0.63 mg via RESPIRATORY_TRACT
  Filled 2016-03-13 (×5): qty 3

## 2016-03-13 NOTE — Progress Notes (Addendum)
ANTICOAGULATION - Follow Up & ANTIBIOTIC - Initial CONSULT NOTE  Pharmacy Consult for Heparin Indication: New acute PE, LUE DVT and RV thrombus  No Known Allergies  Patient Measurements: Height: 6\' 6"  (198.1 cm) Weight: (!) 306 lb (138.801 kg) IBW/kg (Calculated) : 91.4 Heparin Dosing Weight: 121.8 kg  Vital Signs: Temp: 98.4 F (36.9 C) (06/22 0859) Temp Source: Axillary (06/22 0859) BP: 126/80 mmHg (06/22 0859) Pulse Rate: 100 (06/22 0859)  Labs:  Recent Labs  03/11/16  03/12/16 0549  03/12/16 1250 03/12/16 2025 03/13/16 0508  HGB 12.8*  --  13.5  --   --   --  12.7*  HCT 41.5  --  44.1  --   --   --  41.1  PLT 63*  --  68*  --   --   --  70*  LABPROT  --   --   --   --   --   --  23.0*  INR  --   --   --   --   --   --  2.06*  HEPARINUNFRC 0.31  < >  --   < > 0.88* 0.70 0.45  CREATININE  --   --   --   --   --   --  1.04  < > = values in this interval not displayed.  Estimated Creatinine Clearance: 103.2 mL/min (by C-G formula based on Cr of 1.04).   Medications:  Heparin @ 2700 units/hr (27 ml/hr)  Assessment: 17 YOM with hx CLL who presented on 6/15 s/p fall with left arm pain, rhabdo, and rectal bleeding. Rectal bleeding since resolved. The patient was noted to have SOB and increased WOB with CTA on 6/18 showing PE and dopplers confirming LUE DVT. The patient is also noted via ECHO to have a RV thrombus.  Pharmacy has been consulted to start heparin for anticoagulation on 6/18.  Heparin level this morning remains therapeutic (HL 0.45 << 0.7, goal of 0.3-0.7). INR elevated at 2.06 - unclear indication for this, possibly due to recent rhabdo and slight elevation of LFTs? Hgb/Hct slight drop, plts 70 <<68 (chronic thrombocytopenia noted). Per heme/onc - okay to continue heparin as long as plts>50. Some hematuria and rectal bleeding this admit - will continue to watch.   Pharmacy also consulted this morning to start Levaquin for r/o HCAP. Normalized CrCl~65-70  ml/min.   Goal of Therapy:  Heparin level 0.3-0.7 units/ml Monitor platelets by anticoagulation protocol: Yes  Proper antibiotics for infection/cultures adjusted for renal/hepatic function    Plan:  1. Continue Heparin at 2700 units/hr (27 ml/hr) 2. Will follow-up plans to transition to po AC once no longer NPO 3. Will continue to monitor for any signs/symptoms of bleeding and will follow up with heparin level in the a.m.  4. Start Levaquin 750 mg IV every 24 hours 5. Will continue to follow renal function, culture results, LOT, and antibiotic de-escalation plans   Alycia Rossetti, PharmD, BCPS Clinical Pharmacist Pager: [phone] 03/13/2016 9:55 AM

## 2016-03-13 NOTE — Progress Notes (Addendum)
Physical Therapy Treatment Patient Details Name: Dale George MRN: [id number] DOB: 05/19/1946 Today's Date: 03/13/2016    History of Present Illness Pt is a 70 y.o. M who presented after fall at home.  Pt c/o Lt arm pain and rectal bleeding.  Pt was on floor for 24 hrs.  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    PT Comments    Pt with decreased sitting balance and unable to sit with B UE in position he would need for lateral scoot transfers.  Worked on sitting balance at EOB and requires +2.  Pt left In R sidelying with pillows between legs and L UE supported. At this time recommend mechanical lift for OOB and discussed with nurse and NT.  Con't to recommend SNF.  Follow Up Recommendations  SNF;Supervision/Assistance - 24 hour     Equipment Recommendations  None recommended by PT    Recommendations for Other Services       Precautions / Restrictions Precautions Precautions: Fall Precaution Comments: monitor HR Restrictions Weight Bearing Restrictions: No    Mobility  Bed Mobility Overal bed mobility: Needs Assistance Bed Mobility: Rolling;Sidelying to Sit;Sit to Sidelying Rolling: +2 for physical assistance;Mod assist Sidelying to sit: +2 for physical assistance;Max assist     Sit to sidelying: Max assist;+2 for physical assistance General bed mobility comments: A for legs due to pain and A to get trunk upright.    Transfers                 General transfer comment: deferred due to not feeling it was safe to attempt due to decreased balance in sitting.  Ambulation/Gait                 Stairs            Wheelchair Mobility    Modified Rankin (Stroke Patients Only)       Balance Overall balance assessment: Needs assistance Sitting-balance support: Feet supported;Bilateral upper extremity supported Sitting balance-Leahy Scale: Zero Sitting balance - Comments: Pt with R UE on bed  and reaching with L UE for foot of bed.  If let go of L rail, unable to maintain posture and goes R.  Not able to sit with hands near side as he would need for A with a lateral scoot transfer.                            Cognition Arousal/Alertness: Awake/alert Behavior During Therapy: WFL for tasks assessed/performed Overall Cognitive Status: History of cognitive impairments - at baseline                      Exercises      General Comments General comments (skin integrity, edema, etc.): Redness L arm (has DVT)      Pertinent Vitals/Pain Pain Assessment: Faces Faces Pain Scale: Hurts even more Pain Location: legs Pain Descriptors / Indicators: Grimacing;Moaning Pain Intervention(s): Limited activity within patient's tolerance;Monitored during session;Repositioned    Home Living                      Prior Function            PT Goals (current goals can now be found in the care plan section) Acute Rehab PT Goals Patient Stated Goal: pt wanted water PT Goal Formulation: With patient Time For Goal Achievement: 03/22/16 Potential to Achieve Goals:  Fair Progress towards PT goals: Progressing toward goals    Frequency  Min 2X/week    PT Plan Current plan remains appropriate    Co-evaluation             End of Session   Activity Tolerance: Patient limited by pain Patient left: in bed;with call bell/phone within reach     Time: [id number] PT Time Calculation (min) (ACUTE ONLY): 16 min  Charges:  $Therapeutic Activity: 8-22 mins                    G Codes:      Dale George 03/13/2016, 11:58 AM

## 2016-03-13 NOTE — Progress Notes (Signed)
[facility] Progress Note  Patient: Dale George [id number]   PCP: Vena Austria, MD DOB: 11-07-1945   DOA: 03/05/2016   DOS: 03/13/2016   Date of Service: the patient was seen and examined on 03/13/2016  Subjective: Denies any acute complaint and continues to have cough. No chest pain or abdominal pain. No focal deficit. Left arm still swollen. Nutrition: nothing by mouth until speech clears the patient  Brief hospital course: Pt. with PMH of CLL, tongue cancer, CKD 3, dementia, HTN, gout; admitted on 03/05/2016, with complaint of fall as well as rectal bleeding with left arm pain, was found to have acute rhabdomyolysis, during the course the patient was found to have left upper extremity DVT, bilateral PE as well as bilateral femoral vein DVT and RV thrombus without any RV dysfunction. Patient has thrombocytopenia and after discussion with hematology patient was started on heparin and at present no evidence of bleeding reported. Patient has chronic dysphagia but during the hospitalization there was concern regarding patient swallowing and therefore speech therapy consult was placed and patient remains nothing by mouth since then. Currently further plan is continue anticoagulation.  Assessment and Plan: 1. Acute pulmonary embolism (HCC) Bilateral PE along with bilateral femur oral DVT along with left upper extremity DVT as well as a right ventricle thrombus. Significant thrombocytopenia from consumption as well as CLL. On heparin and no evidence of active bleeding. Discussed with Dr. Alvy Bimler again on 03/12/2016, as long as platelets are more than 50 continue anticoagulation. Can transition to oral anticoagulation once able to tolerate orally. We'll continue to closely monitor.  2. Acute on chronic diastolic dysfunction. Rhinovirus bronchiolitis.  Patient has bilateral expiratory wheezing, chest x-ray shows vascular congestion. Possible acute on chronic diastolic  dysfunction and therefore I will give the patient IV Lasix. We'll also use DuoNeb's scheduled. Starting the patient on IVs Solu-Medrol as well as Levaquin.  3. Rhabdomyolysis. Improving with IV hydration. Probable volume overload from IV hydration. We'll continue to monitor.  4. Rectal bleeding. Patient presented with rectal bleeding but at present his FOBT is negative. I will continue to closely monitor while the patient is on anticoagulation.  5. Prior history of tongue cancer. Dysphagia.  No evidence of focal deficit at present. CT head on admission was unremarkable. Speech therapy consulted and currently patient underwent fees, plan is to get MBS tomorrow. Will monitor  Pain management: When necessary Tylenol Activity: SNF as per physical therapy Bowel regimen: last BM 03/10/2016 Diet: Currently remains nothing by mouth  Advance goals of care discussion: DNR/DNI  Family Communication: no family was present at bedside, at the time of interview.   Disposition:  Discharge to SNF. Expected discharge date: 03/15/2016, pending speech therapy evaluation as well as improvement in respiratory status  Consultants: Hematology Procedures: Echocardiogram  Antibiotics: Anti-infectives    Start     Dose/Rate Route Frequency Ordered Stop   03/13/16 1100  levofloxacin (LEVAQUIN) IVPB 750 mg     750 mg 100 mL/hr over 90 Minutes Intravenous Every 24 hours 03/13/16 1000     03/06/16 1200  vancomycin (VANCOCIN) 1,250 mg in sodium chloride 0.9 % 250 mL IVPB  Status:  Discontinued     1,250 mg 166.7 mL/hr over 90 Minutes Intravenous Every 12 hours 03/05/16 2317 03/06/16 1059   03/06/16 0600  piperacillin-tazobactam (ZOSYN) IVPB 3.375 g  Status:  Discontinued     3.375 g 12.5 mL/hr over 240 Minutes Intravenous Every 8 hours 03/05/16 2317 03/06/16 1059   03/05/16 2330  piperacillin-tazobactam (ZOSYN) IVPB 3.375 g     3.375 g 100 mL/hr over 30 Minutes Intravenous STAT 03/05/16 2317  03/06/16 0033   03/05/16 2330  vancomycin (VANCOCIN) 2,500 mg in sodium chloride 0.9 % 500 mL IVPB  Status:  Discontinued     2,500 mg 250 mL/hr over 120 Minutes Intravenous Every 12 hours 03/05/16 2317 03/05/16 2318   03/05/16 2330  vancomycin (VANCOCIN) 2,500 mg in sodium chloride 0.9 % 500 mL IVPB     2,500 mg 250 mL/hr over 120 Minutes Intravenous  Once 03/05/16 2318 03/06/16 0203        Intake/Output Summary (Last 24 hours) at 03/13/16 1754 Last data filed at 03/13/16 0900  Gross per 24 hour  Intake    216 ml  Output   1700 ml  Net  -1484 ml   Filed Weights   03/09/16 1950 03/11/16 2026 03/12/16 2117  Weight: 131.77 kg (290 lb 8 oz) 139.708 kg (308 lb) 138.801 kg (306 lb)    Objective: Physical Exam: Filed Vitals:   03/13/16 0507 03/13/16 0859 03/13/16 1430 03/13/16 1752  BP: 135/85 126/80  118/76  Pulse: 110 100 96 92  Temp: 98.7 F (37.1 C) 98.4 F (36.9 C)  98 F (36.7 C)  TempSrc: Axillary Axillary  Oral  Resp: 20 20 22 20   Height:      Weight:      SpO2: 95% 97% 93% 95%    General: Alert, Awake and Oriented to Place and Person. Appear in moderate distress Eyes: PERRL, Conjunctiva normal ENT: Oral Mucosa clear dry. Neck: no JVD, no Abnormal Mass Or lumps Cardiovascular: S1 and S2 Present, aortic systolic Murmur, Respiratory: Bilateral Air entry equal and Decreased, basal Crackles, bilateral wheezes Abdomen: Bowel Sound present, Soft and no tenderness Skin: no redness, no Rash  Extremities: bilateral Pedal edema, no calf tenderness, left upper extremity edema Neurologic: Grossly no focal neuro deficit. Bilaterally Equal motor strength  Data Reviewed: CBC:  Recent Labs Lab 03/07/16 0816 03/08/16 0540 03/09/16 1532 03/10/16 0428 03/11/16 03/12/16 0549 03/13/16 0508  WBC 93.3* 98.1* 113.6* 109.6* 99.9* 97.1* 88.6*  NEUTROABS 30.8* 8.8*  --   --   --   --  8.0*  HGB 13.6 13.9 13.7 13.3 12.8* 13.5 12.7*  HCT 43.0 43.0 42.7 41.7 41.5 44.1 41.1  MCV  83.7 82.4 82.6 82.6 84.5 86.1 85.6  PLT 70* 82* 72* 67* 63* 68* 70*   Basic Metabolic Panel:  Recent Labs Lab 03/07/16 0816 03/08/16 0540 03/09/16 1532 03/13/16 0508  NA 141 135 137 144  K 5.3* 4.5 4.6 4.3  CL 111 103 105 109  CO2 22 21* 23 25  GLUCOSE 93 100* 127* 111*  BUN 16 13 15  23*  CREATININE 1.07 1.00 1.02 1.04  CALCIUM 8.4* 8.1* 8.2* 8.4*  MG  --   --   --  2.2    Liver Function Tests:  Recent Labs Lab 03/13/16 0508  AST 58*  ALT 60  ALKPHOS 81  BILITOT 1.5*  PROT 5.6*  ALBUMIN 2.3*   No results for input(s): LIPASE, AMYLASE in the last 168 hours. No results for input(s): AMMONIA in the last 168 hours. Coagulation Profile:  Recent Labs Lab 03/13/16 0508  INR 2.06*   Cardiac Enzymes:  Recent Labs Lab 03/07/16 0816 03/09/16 1532 03/09/16 2225 03/10/16 0428  CKTOTAL 769*  --  732*  --   TROPONINI  --  0.12* 0.08* 0.07*   BNP (last 3 results) No results for  input(s): PROBNP in the last 8760 hours.  CBG: No results for input(s): GLUCAP in the last 168 hours.  Studies: No results found.   Scheduled Meds: . ipratropium  0.5 mg Nebulization Q6H  . levalbuterol  0.63 mg Nebulization Q6H  . levofloxacin (LEVAQUIN) IV  750 mg Intravenous Q24H  . methylPREDNISolone (SOLU-MEDROL) injection  60 mg Intravenous BID  . metoprolol  5 mg Intravenous Q8H  . nystatin ointment   Topical BID  . sodium chloride flush  3 mL Intravenous Q12H   Continuous Infusions: . heparin 2,700 Units/hr (03/13/16 1015)   PRN Meds: acetaminophen **OR** acetaminophen, morphine injection, nitroGLYCERIN, ondansetron **OR** ondansetron (ZOFRAN) IV  Time spent: 30 minutes  Author: Berle Mull, MD [facility] Pager: [phone] 03/13/2016 5:54 PM  If 7PM-7AM, please contact night-coverage at [email or web address], password [facility]

## 2016-03-13 NOTE — Clinical Social Work Note (Signed)
Contact made with patient's daughter Caryl Pina to confirm choice of [facility], and this remains the facility choice. Contact made with Narda Rutherford, admissions director at [facility] and confirmed that patient will discharge to their facility when medically stable. Sent updated therapy notes to facility so that authorization process can be initiated, as per MD note, patient will be ready for discharge on 6/24. CSW will continue to follow and facilitate discharge to [facility] once medically ready.  Iolani Twilley Givens, MSW, LCSW Licensed Clinical Social Worker [place] [phone]

## 2016-03-13 NOTE — Progress Notes (Signed)
Speech Language Pathology Treatment: Dysphagia  Patient Details Name: TAITEN BELISLE MRN: [id number] DOB: 1945/11/08 Today's Date: 03/13/2016 Time: [id number] SLP Time Calculation (min) (ACUTE ONLY): 21 min  Assessment / Plan / Recommendation Clinical Impression  With repositioning and oral care, pt able to consume over 20 bites of ice with only slight wet vocal quality x1. Laryngeal elevation on palpation quite weak, only slightly more effort sensed subjectively with cues for effortful swallow. Recommend pt consume ice after oral care with staff to avoid disuse atrophy. Pt to repeat objective testing tomorrow, MBS, to determine if pt may start diet.    HPI HPI: 70 y.o. 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 Stable 4 mm anterolisthesis at C6-7; severe degenerative changes in the cervical spine. CXR no acute      SLP Plan  MBS     Recommendations  Diet recommendations: NPO (except ice chips after oral care) Medication Administration: Crushed with puree             Oral Care Recommendations: Oral care QID Follow up Recommendations: 24 hour supervision/assistance;Skilled Nursing facility Plan: [facility], MA CCC-SLP [phone]  Lynann Beaver 03/13/2016, 2:30 PM

## 2016-03-13 NOTE — Progress Notes (Signed)
Initial Nutrition Assessment  DOCUMENTATION CODES:   Obesity unspecified  INTERVENTION:  Diet advancement as medically appropriate. Plans for MBS tomorrow with SLP.  If pt fails MBS, recommend consideration of enteral nutrition. Recommend Jevity 1.5 formula at 25 ml/hr and increase by 10 ml every 4 hours to goal rate of 55 ml/hr with 30 ml Prostat TID to provide 2280 kcal (100% of needs), 129 grams of protein, and 1003 ml of H2O.   RD to continue to monitor.   NUTRITION DIAGNOSIS:   Inadequate oral intake related to inability to eat as evidenced by NPO status.  GOAL:   Patient will meet greater than or equal to 90% of their needs  MONITOR:   Diet advancement, Weight trends, Labs, I & O's, Skin  REASON FOR ASSESSMENT:   Low Braden    ASSESSMENT:   70 y.o. 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 Stable 4 mm anterolisthesis at C6-7; severe degenerative changes in the cervical spine.  Pt was unavailable during attempted time of visit. RD unable to obtain most recent nutrition history. Pt has been NPO since 6/19. Meal completion prior to NPO status has been varied from 0-50-75%. FEES done 6/19 with diagnosis of moderate pharyngeal phase dysphagia. Plans for MBS tomorrow. If pt fails swallow evaluation, may need consideration of starting enteral nutrition to provide adequate nutrition. Tube feeding recommendations stated above if needed. Weight records reviewed. Noted pt with a 7.6% weight loss in 3 months. RD to continue to monitor.  Unable to complete  Nutrition-Focused physical exam at this time. RD to perform at next visit.  Labs and medications reviewed.   Diet Order:  Diet NPO time specified Except for: Ice Chips  Skin:  Wound (see comment) (DTI on L hip)  Last BM:  6/19  Height:   Ht Readings from Last 1 Encounters:  03/06/16 6\' 6"  (1.981 m)    Weight:   Wt Readings from Last 1 Encounters:  03/12/16 306 lb (138.801 kg)    Ideal Body Weight:  97 kg  BMI:  Body mass index is 35.37 kg/(m^2).  Estimated Nutritional Needs:   Kcal:  2200-2450  Protein:  115-130 grams  Fluid:  Per MD  EDUCATION NEEDS:   No education needs identified at this time  Corrin Parker, MS, RD, LDN Pager # [phone] After hours/ weekend pager # [phone]

## 2016-03-14 ENCOUNTER — Other Ambulatory Visit: Payer: Self-pay | Admitting: Hematology and Oncology

## 2016-03-14 ENCOUNTER — Telehealth: Payer: Self-pay | Admitting: Hematology and Oncology

## 2016-03-14 ENCOUNTER — Inpatient Hospital Stay (HOSPITAL_COMMUNITY): Payer: Medicare PPO

## 2016-03-14 DIAGNOSIS — I10 Essential (primary) hypertension: Secondary | ICD-10-CM

## 2016-03-14 LAB — CBC
HCT: 41.2 % (ref 39.0–52.0)
HEMOGLOBIN: 13.1 g/dL (ref 13.0–17.0)
MCH: 27.4 pg (ref 26.0–34.0)
MCHC: 31.8 g/dL (ref 30.0–36.0)
MCV: 86.2 fL (ref 78.0–100.0)
Platelets: 75 10*3/uL — ABNORMAL LOW (ref 150–400)
RBC: 4.78 MIL/uL (ref 4.22–5.81)
RDW: 15.8 % — ABNORMAL HIGH (ref 11.5–15.5)
WBC: 99.3 10*3/uL (ref 4.0–10.5)

## 2016-03-14 LAB — BASIC METABOLIC PANEL
ANION GAP: 7 (ref 5–15)
BUN: 28 mg/dL — ABNORMAL HIGH (ref 6–20)
CO2: 26 mmol/L (ref 22–32)
Calcium: 8.4 mg/dL — ABNORMAL LOW (ref 8.9–10.3)
Chloride: 110 mmol/L (ref 101–111)
Creatinine, Ser: 1.1 mg/dL (ref 0.61–1.24)
Glucose, Bld: 154 mg/dL — ABNORMAL HIGH (ref 65–99)
POTASSIUM: 3.6 mmol/L (ref 3.5–5.1)
SODIUM: 143 mmol/L (ref 135–145)

## 2016-03-14 LAB — HEPARIN LEVEL (UNFRACTIONATED)
HEPARIN UNFRACTIONATED: 0.93 [IU]/mL — AB (ref 0.30–0.70)
HEPARIN UNFRACTIONATED: 0.93 [IU]/mL — AB (ref 0.30–0.70)

## 2016-03-14 LAB — MAGNESIUM: MAGNESIUM: 2.3 mg/dL (ref 1.7–2.4)

## 2016-03-14 MED ORDER — LEVOFLOXACIN 750 MG PO TABS: 750.0000 mg | ORAL_TABLET | Freq: Every day | ORAL | Status: DC

## 2016-03-14 MED ORDER — CHLORHEXIDINE GLUCONATE 0.12 % MT SOLN
15.0000 mL | Freq: Two times a day (BID) | OROMUCOSAL | Status: DC
  Administered 2016-03-14: 15 mL via OROMUCOSAL
  Filled 2016-03-14: qty 15

## 2016-03-14 MED ORDER — TRAMADOL HCL 50 MG PO TABS: 50.0000 mg | ORAL_TABLET | Freq: Two times a day (BID) | ORAL | Status: DC | PRN

## 2016-03-14 MED ORDER — CETYLPYRIDINIUM CHLORIDE 0.05 % MT LIQD
7.0000 mL | Freq: Two times a day (BID) | OROMUCOSAL | Status: DC
  Administered 2016-03-14: 7 mL via OROMUCOSAL

## 2016-03-14 MED ORDER — APIXABAN 5 MG PO TABS: 10.0000 mg | ORAL_TABLET | Freq: Two times a day (BID) | ORAL | Status: DC

## 2016-03-14 MED ORDER — LEVALBUTEROL HCL 0.63 MG/3ML IN NEBU: 0.6300 mg | INHALATION_SOLUTION | Freq: Four times a day (QID) | RESPIRATORY_TRACT | Status: AC | PRN

## 2016-03-14 MED ORDER — APIXABAN 5 MG PO TABS: 5.0000 mg | ORAL_TABLET | Freq: Two times a day (BID) | ORAL | Status: DC

## 2016-03-14 MED ORDER — CHLORHEXIDINE GLUCONATE 0.12 % MT SOLN: 15.0000 mL | Freq: Two times a day (BID) | OROMUCOSAL | Status: AC

## 2016-03-14 MED ORDER — METOPROLOL TARTRATE 25 MG PO TABS: 25.0000 mg | ORAL_TABLET | Freq: Two times a day (BID) | ORAL | Status: AC

## 2016-03-14 MED ORDER — PREDNISONE 10 MG PO TABS: ORAL_TABLET | ORAL | Status: DC

## 2016-03-14 MED ORDER — NYSTATIN 100000 UNIT/GM EX OINT: TOPICAL_OINTMENT | Freq: Two times a day (BID) | CUTANEOUS | Status: DC

## 2016-03-14 MED ORDER — APIXABAN 5 MG PO TABS
10.0000 mg | ORAL_TABLET | Freq: Once | ORAL | Status: AC
  Administered 2016-03-14: 10 mg via ORAL
  Filled 2016-03-14: qty 2

## 2016-03-14 MED ORDER — IPRATROPIUM-ALBUTEROL 18-103 MCG/ACT IN AERO: 2.0000 | INHALATION_SPRAY | Freq: Four times a day (QID) | RESPIRATORY_TRACT | Status: DC

## 2016-03-14 MED ORDER — IPRATROPIUM BROMIDE 0.02 % IN SOLN: 0.5000 mg | Freq: Four times a day (QID) | RESPIRATORY_TRACT | Status: DC

## 2016-03-14 MED ORDER — LEVALBUTEROL HCL 0.63 MG/3ML IN NEBU: 0.6300 mg | INHALATION_SOLUTION | Freq: Four times a day (QID) | RESPIRATORY_TRACT | Status: DC

## 2016-03-14 NOTE — Discharge Summary (Addendum)
[facility] Discharge Summary   Patient: Dale George [id number]   PCP: Vena Austria, MD DOB: [birth year]   Date of admission: 03/05/2016   Date of discharge:  03/14/2016    Discharge Diagnoses:  Principal Problem:   Acute pulmonary embolism ([place]) Active Problems:   Hyperlipidemia   HTN (hypertension)   CLL (chronic lymphocytic leukemia) (HCC)   Thrombocytopenia (HCC)   Fall   Rhabdomyolysis   Rectal bleeding   Left arm pain   CKD (chronic kidney disease), stage III   Pressure ulcer   Acute deep vein thrombosis (DVT) of brachial vein of left upper extremity ([place])   Dysphagia   Rhinovirus infection   Admitted From: Home Disposition:  SNF with palliative care support  Recommendations for Outpatient Follow-up:  1. Follow-up with PCP in one week with CBC. 2. Follow-up with Dr. Alvy Bimler 1 week. 3. Establish care With palliative care to continue discussion regarding goals of care.   Follow-up Information    Follow up with Vena Austria, MD. Schedule an appointment as soon as possible for a visit in 1 week.   Specialty:  Family Medicine   Contact information:   [address] [place] [address] [phone]       Please follow up.   Why:  ESTABLISH CARE WITH PALLIATIVE CARE.      Follow up with [facility], NI, MD. Schedule an appointment as soon as possible for a visit in 1 week.   Specialty:  Hematology and Oncology   Contact information:   [place] [id number] [phone]      Diet recommendation: Regular diet, severe aspiration risk  Activity: The patient is advised to gradually reintroduce usual activities.  Discharge Condition: fair  Code Status: DNR/DNI  History of present illness: As per the H and P dictated on admission, "Dale George is a 70 y.o. male with medical history significant of CLL under observation, remote tongue cancer (s/p of surgery and XRT), CKD-III, dementia, Ataxia,  hypertension, hyperlipidemia, gout, who presents with fall, left arm pain, rectal bleeding  he patient reports that around 6 PM last night, he fell when trying to get out of bed. No LOC. He called EMS and they assisted him to standing. He did not want transport at that time. Around midnight, he got up again and had another fall. He remained on the floor on his left side. He is complaining of left arm pain, which is constant, 10 out of 10 in severity, nonradiating. Per EMS, pt was found to have ~200 ml bright red blood per rectum. He reports a chronic mild cough with phlegm which is at baseline. No fever, chills, chest pain or shortness of breath. He denies symptoms of UTI, unilateral weakness. No nausea, vomiting, diarrhea, abdominal pain. Per his daughter, he had colonoscopy approximately 5 years ago which was negative. Patient denies head injury. No headache currently. Per family, pt's mental status is at his baseline."  Hospital Course:  Summary of his active problems in the hospital is as following. 1. Acute pulmonary embolism ([place]) Bilateral PE along with bilateral femoral DVT along with left upper extremity DVT as well as a right ventricle thrombus. Significant thrombocytopenia from consumption as well as CLL. On heparin and no evidence of active bleeding. Discussed with Dr. Alvy Bimler again on 03/12/2016, as long as platelets are more than 50 continue anticoagulation. We'll transition to oral Apixaban today. Repeat CBC in 1 week, monitor for bleeding  2. Acute on  chronic diastolic dysfunction. Rhinovirus bronchiolitis. Sepsis ruled out. SIRS on admission due to acute pulmonary embolism.  Patient has bilateral expiratory wheezing, chest x-ray shows vascular congestion. Patient received one dose of IV Lasix. Started on duo nebs as well as steroids and improving with resolution of wheezing. Bettina Combivent on discharge for one week. Complete 5 day course with Levaquin. Continue prednisone  taper  3. Rhabdomyolysis. Improving with IV hydration. Probable volume overload from IV hydration. Currently euvolemic, CK from 2500-734  4. Rectal bleeding. Patient presented with rectal bleeding but at present his FOBT is negative. continue to closely monitor while the patient is on anticoagulation.  5. Prior history of tongue cancer. Dysphagia.  No evidence of focal deficit at present. CT head on admission was unremarkable. Speech therapy consulted and patient underwent MBS. Patient was identified as a severe aspiration risk. The daughter and the patient wants to eat regular food with regular consistency liquids understanding the risk for severe aspiration as well as risk for recurrent pneumonia and life-threatening aspiration.  6. Goals of care discussion. I discussed with patient who mentions that he would like comfort as his main priority. Patient does not have significant support at home. He does not have 24 7 care at home at present. Family is requesting the patient to be transferred to SNF. Patient will benefit from palliative care consultation in the SNF regarding further discussion for goals of care. Patient and family understand that he is at high risk for bleeding from her thrombocytopenia and requirement for anticoagulation, patient is at high risk for aspiration from dysphagia and patient's deconditioning is also concerning. This was discussed with patient's family and they understand the risk.  7. Pressure ulcer. Stage II Bilateral buttocks Barrier cream to protect and promote healing. Foam dressing to left hip. Pt is on an air mattress to decrease pressure to the affected areas. Vaseline to promote healing to lip  All other chronic medical condition were stable during the hospitalization.  Patient was seen by physical therapy, who recommended SNF, which was arranged by Education officer, museum and case Freight forwarder. On the day of the discharge the patient's vitals were stable, and  no other acute medical condition were reported by patient. the patient was felt safe to be discharge at [facility] with palliative care and therapy.  Procedures and Results:  Echocardiogram  Vascular lower extremity and upper extremity Doppler  MBS  Consultations:  Phone consultation with hematology  Speech therapy  DISCHARGE MEDICATION: Current Discharge Medication List    START taking these medications   Details  albuterol-ipratropium (COMBIVENT) 18-103 MCG/ACT inhaler Inhale 2 puffs into the lungs every 6 (six) hours. Qty: 1 each, Refills: 0    !! apixaban (ELIQUIS) 5 MG TABS tablet Take 2 tablets (10 mg total) by mouth 2 (two) times daily. Qty: 13 tablet, Refills: 0    !! apixaban (ELIQUIS) 5 MG TABS tablet Take 1 tablet (5 mg total) by mouth 2 (two) times daily. Qty: 60 tablet, Refills: 0    chlorhexidine (PERIDEX) 0.12 % solution 15 mLs by Mouth Rinse route 2 (two) times daily. Qty: 120 mL, Refills: 0    levalbuterol (XOPENEX) 0.63 MG/3ML nebulizer solution Take 3 mLs (0.63 mg total) by nebulization every 6 (six) hours as needed for wheezing or shortness of breath. Qty: 3 mL, Refills: 0    levofloxacin (LEVAQUIN) 750 MG tablet Take 1 tablet (750 mg total) by mouth daily. Qty: 3 tablet, Refills: 0    metoprolol tartrate (LOPRESSOR) 25 MG tablet  Take 1 tablet (25 mg total) by mouth 2 (two) times daily. Qty: 60 tablet, Refills: 0    nystatin ointment (MYCOSTATIN) Apply topically 2 (two) times daily. Qty: 30 g, Refills: 0    predniSONE (DELTASONE) 10 MG tablet Take 40mg  daily for 3days,Take 30mg  daily for 3days,Take 20mg  daily for 3days,Take 10mg  daily for 3days, then stop. Qty: 30 tablet, Refills: 0    traMADol (ULTRAM) 50 MG tablet Take 1 tablet (50 mg total) by mouth every 12 (twelve) hours as needed. Qty: 10 tablet, Refills: 0     !! - Potential duplicate medications found. Please discuss with provider.    STOP taking these medications     indomethacin (INDOCIN)  50 MG capsule      simvastatin (ZOCOR) 10 MG tablet        No Known Allergies Discharge Instructions    Amb Referral to Palliative Care    Complete by:  As directed           Discharge Exam: [place] Weights   03/09/16 1950 03/11/16 2026 03/12/16 2117  Weight: 131.77 kg (290 lb 8 oz) 139.708 kg (308 lb) 138.801 kg (306 lb)   Filed Vitals:   03/14/16 0546 03/14/16 1000  BP: 129/79 124/82  Pulse: 92 112  Temp: 97.6 F (36.4 C) 97.7 F (36.5 C)  Resp: 24 20   General: Appear in no distress, no Rash; Oral Mucosa moist. Cardiovascular: S1 and S2 Present, no Murmur, no JVD Respiratory: Bilateral Air entry present and Clear to Auscultation, no Crackles, no wheezes Abdomen: Bowel Sound present, Soft and no tenderness Extremities: Left upper extremity edema, bilateral lower extremity edema, no calf tenderness Neurology: Grossly no focal neuro deficit.  The results of significant diagnostics from this hospitalization (including imaging, microbiology, ancillary and laboratory) are listed below for reference.    Significant Diagnostic Studies: Dg Chest 2 View  03/05/2016  CLINICAL DATA:  Found lying on the floor with bright red bleeding from rectum. Fell yesterday. EXAM: CHEST  2 VIEW COMPARISON:  03/29/2015 FINDINGS: The lungs are clear without airspace disease or pulmonary edema. Heart and mediastinum are within normal limits. Negative for a pneumothorax. Lateral view is limited because the arms are not elevated. No large pleural effusions. Bony thorax is grossly intact. Degenerative changes at the right [facility] joint. IMPRESSION: No acute chest abnormality. Electronically Signed   By: Markus Daft M.D.   On: 03/05/2016 19:57   Ct Head Wo Contrast  03/05/2016  CLINICAL DATA:  Unwitnessed fall. EXAM: CT HEAD WITHOUT CONTRAST CT CERVICAL SPINE WITHOUT CONTRAST TECHNIQUE: Multidetector CT imaging of the head and cervical spine was performed following the standard protocol without intravenous  contrast. Multiplanar CT image reconstructions of the cervical spine were also generated. COMPARISON:  03/29/2015 head and cervical spine CT. FINDINGS: CT HEAD FINDINGS No evidence of parenchymal hemorrhage or extra-axial fluid collection. No mass lesion, mass effect, or midline shift. No CT evidence of acute infarction. Mild intracranial atherosclerosis. Nonspecific moderate subcortical and periventricular white matter hypodensity, most in keeping with chronic small vessel ischemic change. Generalized cerebral volume loss. Cerebral ventricle sizes are stable and concordant with the degree of cerebral volume loss. Mucoperiosteal thickening throughout the visualized left maxillary sinus with associated small fluid level. Mucoperiosteal thickening throughout the dependent frontal sinus, sphenoid sinus and bilateral ethmoidal air cells. Small right greater than left mastoid effusions. No evidence of calvarial fracture. CT CERVICAL SPINE FINDINGS No fracture is detected in the cervical spine. Re- demonstrated are hypoplastic pedicles,  incomplete posterior neural arch fusion and chronic bilateral pars defects at C6. No prevertebral soft tissue swelling. Normal cervical lordosis. Dens is well positioned between the lateral masses of C1. No acute facet subluxation. Marked degenerative disc disease throughout the cervical spine, most prominent at C6-7 with bulky anterior marginal osteophytes at C3-4. Posterior disc bulge at C3-4 effaces the anterior thecal sac with no definite cord flattening, unchanged. Severe bilateral facet arthropathy. Moderate to severe foraminal stenosis on the left at C3-4. Severe bilateral foraminal stenosis at C4-5. Stable 4 mm anterolisthesis at C6-7. No acute cervical spine subluxation. Small bilateral inferior mastoid effusions. No evidence of intra-axial hemorrhage in the visualized brain. No gross cervical canal hematoma. No significant pulmonary nodules at the visualized lung apices. No  cervical adenopathy or other significant neck soft tissue abnormality. IMPRESSION: 1. No evidence of acute intracranial abnormality. No evidence of calvarial fracture. 2. Generalized cerebral volume loss and moderate chronic small vessel ischemia. 3. No cervical spine fracture or acute malalignment. 4. 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 Stable 4 mm anterolisthesis at C6-7. 5. Severe degenerative changes in the cervical spine as described. 6. Paranasal sinusitis, possibly acute. 7. Small bilateral mastoid effusions. Electronically Signed   By: Ilona Sorrel M.D.   On: 03/05/2016 20:47   Ct Angio Chest Pe W Or Wo Contrast  03/09/2016  ADDENDUM REPORT: 03/09/2016 22:06 ADDENDUM: Critical Value/emergent results were called by telephone at the time of interpretation on 03/09/2016 at 9:52 p.m. to Dr. Pincus Sanes, who verbally acknowledged these results. Electronically Signed   By: Marin Olp M.D.   On: 03/09/2016 22:06  03/09/2016  CLINICAL DATA:  Shortness of breath. EXAM: CT ANGIOGRAPHY CHEST WITH CONTRAST TECHNIQUE: Multidetector CT imaging of the chest was performed using the standard protocol during bolus administration of intravenous contrast. Multiplanar CT image reconstructions and MIPs were obtained to evaluate the vascular anatomy. CONTRAST:  80 mL Isovue 370 IV. COMPARISON:  Chest x-ray today. FINDINGS: Lungs are well inflated with minimal pleural based opacification over the posterior right lower lobe likely atelectasis versus infarct. No effusion. Airways are within normal. Heart is normal size. Thoracic aorta is within normal. Pulmonary arterial system is well opacified and demonstrates multiple bilateral pulmonary emboli right worse than left. Most of the emboli are over the distal right main pulmonary artery and proximal right lower lobar arteries. Emboli throughout the right upper and middle lobe arteries as well as the lingular artery. RV/LV ratio is 0.8. No  significant hilar or mediastinal adenopathy. Remaining mediastinal structures are within normal. Mild stranding of the fat in the left axilla adjacent the axillary vessels. Images through the upper abdomen demonstrate mild splenomegaly. Mild degenerate change of the spine. Review of the MIP images confirms the above findings. IMPRESSION: Moderate burden of pulmonary emboli right worse than left as described. RV/LV ratio 0.8 which is within normal. Small infarct over the posterior right lower lobe. Mild stranding of the fat in the left axilla adjacent the axillary vessels as cannot exclude left upper extremity venous thrombus. Recommend clinical correlation. Splenomegaly. Electronically Signed: By: Marin Olp M.D. On: 03/09/2016 21:42   Ct Cervical Spine Wo Contrast  03/05/2016  CLINICAL DATA:  Unwitnessed fall. EXAM: CT HEAD WITHOUT CONTRAST CT CERVICAL SPINE WITHOUT CONTRAST TECHNIQUE: Multidetector CT imaging of the head and cervical spine was performed following the standard protocol without intravenous contrast. Multiplanar CT image reconstructions of the cervical spine were also generated. COMPARISON:  03/29/2015 head and cervical spine  CT. FINDINGS: CT HEAD FINDINGS No evidence of parenchymal hemorrhage or extra-axial fluid collection. No mass lesion, mass effect, or midline shift. No CT evidence of acute infarction. Mild intracranial atherosclerosis. Nonspecific moderate subcortical and periventricular white matter hypodensity, most in keeping with chronic small vessel ischemic change. Generalized cerebral volume loss. Cerebral ventricle sizes are stable and concordant with the degree of cerebral volume loss. Mucoperiosteal thickening throughout the visualized left maxillary sinus with associated small fluid level. Mucoperiosteal thickening throughout the dependent frontal sinus, sphenoid sinus and bilateral ethmoidal air cells. Small right greater than left mastoid effusions. No evidence of calvarial  fracture. CT CERVICAL SPINE FINDINGS No fracture is detected in the cervical spine. 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 No prevertebral soft tissue swelling. Normal cervical lordosis. Dens is well positioned between the lateral masses of C1. No acute facet subluxation. Marked degenerative disc disease throughout the cervical spine, most prominent at C6-7 with bulky anterior marginal osteophytes at C3-4. Posterior disc bulge at C3-4 effaces the anterior thecal sac with no definite cord flattening, unchanged. Severe bilateral facet arthropathy. Moderate to severe foraminal stenosis on the left at C3-4. Severe bilateral foraminal stenosis at C4-5. Stable 4 mm anterolisthesis at C6-7. No acute cervical spine subluxation. Small bilateral inferior mastoid effusions. No evidence of intra-axial hemorrhage in the visualized brain. No gross cervical canal hematoma. No significant pulmonary nodules at the visualized lung apices. No cervical adenopathy or other significant neck soft tissue abnormality. IMPRESSION: 1. No evidence of acute intracranial abnormality. No evidence of calvarial fracture. 2. Generalized cerebral volume loss and moderate chronic small vessel ischemia. 3. No cervical spine fracture or acute malalignment. 4. 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 Stable 4 mm anterolisthesis at C6-7. 5. Severe degenerative changes in the cervical spine as described. 6. Paranasal sinusitis, possibly acute. 7. Small bilateral mastoid effusions. Electronically Signed   By: Ilona Sorrel M.D.   On: 03/05/2016 20:47   Dg Chest Port 1 View  03/12/2016  CLINICAL DATA:  Shortness of breath.  Acute pulmonary embolism. EXAM: PORTABLE CHEST 1 VIEW COMPARISON:  03/09/2016 FINDINGS: Mild cardiomegaly remains stable. Increased central bilateral perihilar airspace opacity is seen which appears symmetric and  suspicious for mild acute pulmonary edema. No evidence of focal pulmonary consolidation or pleural effusion. IMPRESSION: Increased symmetric central perihilar airspace opacity, suspicious for mild acute pulmonary edema. Stable mild cardiomegaly. Electronically Signed   By: Earle Gell M.D.   On: 03/12/2016 12:25   Dg Chest Port 1 View  03/09/2016  CLINICAL DATA:  Patient with shortness of breath. EXAM: PORTABLE CHEST 1 VIEW COMPARISON:  Chest radiograph 03/05/2016. FINDINGS: Multiple monitoring leads overlie the patient. Stable cardiomegaly. Tortuosity of the thoracic aorta. Pulmonary vascular redistribution. No large area of pulmonary consolidation. No pleural effusion pneumothorax. IMPRESSION: Cardiomegaly with pulmonary vascular redistribution. No acute cardiopulmonary process. Electronically Signed   By: Lovey Newcomer M.D.   On: 03/09/2016 14:43   Dg Humerus Left  03/05/2016  CLINICAL DATA:  [place] yesterday.  Posterior elbow pain. EXAM: LEFT HUMERUS - 2+ VIEW COMPARISON:  None. FINDINGS: The left humerus appears intact. No evidence of acute fracture or dislocation. No focal bone lesion or bone destruction. Soft tissues are unremarkable. Note that the elbow is not optimally evaluated on humeral films. If there is suspicion of elbow injury, elbow series should be obtained. IMPRESSION: Negative. Electronically Signed   By: Oren Beckmann.D.  On: 03/05/2016 23:58   Dg Swallowing Func-speech Pathology  03/14/2016  Objective Swallowing Evaluation: Type of Study: MBS-Modified Barium Swallow Study Patient Details Name: TAITEN BELISLE MRN: [id number] Date of Birth: 12-01-1945 Today's Date: 03/14/2016 Time: SLP Start Time (ACUTE ONLY): 0935-SLP Stop Time (ACUTE ONLY): 1000 SLP Time Calculation (min) (ACUTE ONLY): 25 min Past Medical History: Past Medical History Diagnosis Date . Cancer (HCC)    tongue cancer . Hypertension  . Hyperlipidemia  . Lymphocytosis 12/14/2013 . Fatigue 12/14/2013 . CLL (chronic lymphocytic  leukemia) ([place]) 12/23/2013 . Gout  . Insomnia  . Chronic kidney disease    Stage 3 . Dementia  . Ataxia  Past Surgical History: Past Surgical History Procedure Laterality Date . Lymph node dissection   HPI: 70 y.o. 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 Stable 4 mm anterolisthesis at C6-7; severe degenerative changes in the cervical spine. CXR no acute Subjective: "don't feel great. Can I have some water?" Assessment / Plan / Recommendation CHL IP CLINICAL IMPRESSIONS 03/14/2016 Therapy Diagnosis Severe pharyngeal phase dysphagia Clinical Impression Pt presents with a persistent and chronic oropharyngeal dysphagia with minimal base of tongue recruitment and no upper pharyngeal contrictor participation in swallow. This results is severe residuals in the upper oropharynx with naso pharyngeal reflux with swallow attempts. Pt with silent aspiration of nectar thick liquids and very little ability to functionally consume foods and liquids as only a portion of the bolus passes through the UES. A head turn to the left asists slightly with peristalsis and helped the pt transit a greater portion of the bolus, though it does not resolve aspiration risk. Likely the pt has been struggling with swallowing for a long time which has contributed to his recent decline. After falling his ability to compensate is very poor and aspiration risk is high. SLP discussed findings with the pts duaghter who was very clear that she wants her father to eat and drink with known risk of aspiration for comfort. Informed th  Dr.; Will initiate diet.  Impact on safety and function Severe aspiration risk   CHL IP TREATMENT RECOMMENDATION 03/14/2016 Treatment Recommendations Therapy as outlined in treatment plan below   Prognosis 03/14/2016 Prognosis for Safe Diet Advancement Guarded Barriers to Reach Goals Severity of deficits Barriers/Prognosis Comment -- CHL IP DIET RECOMMENDATION 03/14/2016 SLP Diet Recommendations Regular solids;Thin liquid Liquid Administration via Cup;Straw Medication Administration Crushed with puree Compensations (No Data) Postural Changes Remain semi-upright after after feeds/meals (Comment)   CHL IP OTHER RECOMMENDATIONS 03/14/2016 Recommended Consults -- Oral Care Recommendations Oral care QID Other Recommendations Have oral suction available   CHL IP FOLLOW UP RECOMMENDATIONS 03/14/2016 Follow up Recommendations 24 hour supervision/assistance;Skilled Nursing facility   [facility] IP FREQUENCY AND DURATION 03/14/2016 Speech Therapy Frequency (ACUTE ONLY) min 2x/week Treatment Duration 2 weeks      CHL IP ORAL PHASE 03/14/2016 Oral Phase Impaired Oral - Pudding Teaspoon -- Oral - Pudding Cup -- Oral - Honey Teaspoon -- Oral - Honey Cup -- Oral - Nectar Teaspoon -- Oral - Nectar Cup -- Oral - Nectar Straw -- Oral - Thin Teaspoon -- Oral - Thin Cup -- Oral - Thin Straw --  Oral - Puree -- Oral - Mech Soft -- Oral - Regular -- Oral - Multi-Consistency -- Oral - Pill -- Oral Phase - Comment --  CHL IP PHARYNGEAL PHASE 03/14/2016 Pharyngeal Phase Impaired Pharyngeal- Pudding Teaspoon -- Pharyngeal -- Pharyngeal- Pudding Cup -- Pharyngeal -- Pharyngeal- Honey Teaspoon -- Pharyngeal -- Pharyngeal- Honey Cup -- Pharyngeal -- Pharyngeal- Nectar Teaspoon Reduced pharyngeal peristalsis;Reduced epiglottic inversion;Reduced tongue base retraction;Penetration/Apiration after swallow;Moderate aspiration;Pharyngeal residue - valleculae;Pharyngeal residue - pyriform;Pharyngeal residue - posterior pharnyx;Compensatory strategies attempted (with  notebox);Nasopharyngeal reflux Pharyngeal Material enters airway, passes BELOW cords without attempt by patient to eject out (silent aspiration) Pharyngeal- Nectar Cup Reduced pharyngeal peristalsis;Reduced epiglottic inversion;Reduced tongue base retraction;Penetration/Apiration after swallow;Moderate aspiration;Pharyngeal residue - valleculae;Pharyngeal residue - pyriform;Pharyngeal residue - posterior pharnyx;Compensatory strategies attempted (with notebox);Nasopharyngeal reflux Pharyngeal -- Pharyngeal- Nectar Straw -- Pharyngeal -- Pharyngeal- Thin Teaspoon NT Pharyngeal -- Pharyngeal- Thin Cup -- Pharyngeal -- Pharyngeal- Thin Straw -- Pharyngeal -- Pharyngeal- Puree Reduced pharyngeal peristalsis;Reduced epiglottic inversion;Reduced tongue base retraction;Penetration/Apiration after swallow;Moderate aspiration;Pharyngeal residue - valleculae;Pharyngeal residue - pyriform;Pharyngeal residue - posterior pharnyx;Compensatory strategies attempted (with notebox);Nasopharyngeal reflux Pharyngeal -- Pharyngeal- Mechanical Soft -- Pharyngeal -- Pharyngeal- Regular -- Pharyngeal -- Pharyngeal- Multi-consistency -- Pharyngeal -- Pharyngeal- Pill -- Pharyngeal -- Pharyngeal Comment --  CHL IP CERVICAL ESOPHAGEAL PHASE 03/10/2016 Cervical Esophageal Phase WFL Pudding Teaspoon -- Pudding Cup -- Honey Teaspoon -- Honey Cup -- Nectar Teaspoon -- Nectar Cup -- Nectar Straw -- Thin Teaspoon -- Thin Cup -- Thin Straw -- Puree -- Mechanical Soft -- Regular -- Multi-consistency -- Pill -- Cervical Esophageal Comment -- No flowsheet data found. Herbie Baltimore, MA CCC-SLP [phone] Lynann Beaver 03/14/2016, 11:23 AM               Microbiology: Recent Results (from the past 240 hour(s))  Culture, blood (routine x 2)     Status: Abnormal   Collection Time: 03/05/16  6:30 PM  Result Value Ref Range Status   Specimen Description BLOOD RIGHT ANTECUBITAL  Final   Special Requests BOTTLES DRAWN AEROBIC AND ANAEROBIC 5CC   Final   Culture  Setup Time   Final    GRAM POSITIVE COCCI IN CLUSTERS ANAEROBIC BOTTLE ONLY CRITICAL RESULT CALLED TO, READ BACK BY AND VERIFIED WITH: C ROBERTSON PHARMD 1818 03/06/16 A BROWNING    Culture (A)  Final    STAPHYLOCOCCUS SPECIES (COAGULASE NEGATIVE) THE SIGNIFICANCE OF ISOLATING THIS ORGANISM FROM A SINGLE SET OF BLOOD CULTURES WHEN MULTIPLE SETS ARE DRAWN IS UNCERTAIN. PLEASE NOTIFY THE MICROBIOLOGY DEPARTMENT WITHIN ONE WEEK IF SPECIATION AND SENSITIVITIES ARE REQUIRED.    Report Status 03/08/2016 FINAL  Final  Blood Culture ID Panel (Reflexed)     Status: Abnormal   Collection Time: 03/05/16  6:30 PM  Result Value Ref Range Status   Enterococcus species NOT DETECTED NOT DETECTED Final   Vancomycin resistance NOT DETECTED NOT DETECTED Final   Listeria monocytogenes NOT DETECTED NOT DETECTED Final   Staphylococcus species DETECTED (A) NOT DETECTED Final    Comment: CRITICAL RESULT CALLED TO, READ BACK BY AND VERIFIED WITH: C ROBERTSON PHARMD 1818 03/06/16 A BROWNING    Staphylococcus aureus NOT DETECTED NOT DETECTED Final   Methicillin resistance DETECTED (A) NOT DETECTED Final    Comment: CRITICAL RESULT CALLED TO, READ BACK BY AND VERIFIED WITH: C ROBERTSON PHARMD 1818 03/06/16 A BROWNING    Streptococcus species NOT DETECTED NOT DETECTED Final   Streptococcus agalactiae NOT DETECTED NOT DETECTED Final   Streptococcus pneumoniae NOT DETECTED NOT DETECTED Final  Streptococcus pyogenes NOT DETECTED NOT DETECTED Final   Acinetobacter baumannii NOT DETECTED NOT DETECTED Final   Enterobacteriaceae species NOT DETECTED NOT DETECTED Final   Enterobacter cloacae complex NOT DETECTED NOT DETECTED Final   Escherichia coli NOT DETECTED NOT DETECTED Final   Klebsiella oxytoca NOT DETECTED NOT DETECTED Final   Klebsiella pneumoniae NOT DETECTED NOT DETECTED Final   Proteus species NOT DETECTED NOT DETECTED Final   Serratia marcescens NOT DETECTED NOT DETECTED Final    Carbapenem resistance NOT DETECTED NOT DETECTED Final   Haemophilus influenzae NOT DETECTED NOT DETECTED Final   Neisseria meningitidis NOT DETECTED NOT DETECTED Final   Pseudomonas aeruginosa NOT DETECTED NOT DETECTED Final   Candida albicans NOT DETECTED NOT DETECTED Final   Candida glabrata NOT DETECTED NOT DETECTED Final   Candida krusei NOT DETECTED NOT DETECTED Final   Candida parapsilosis NOT DETECTED NOT DETECTED Final   Candida tropicalis NOT DETECTED NOT DETECTED Final  Culture, blood (Routine X 2) w Reflex to ID Panel     Status: None   Collection Time: 03/05/16  8:35 PM  Result Value Ref Range Status   Specimen Description BLOOD RIGHT HAND  Final   Special Requests IN PEDIATRIC BOTTLE 2CC  Final   Culture NO GROWTH 5 DAYS  Final   Report Status 03/10/2016 FINAL  Final  Urine culture     Status: None   Collection Time: 03/05/16 11:27 PM  Result Value Ref Range Status   Specimen Description URINE, CATHETERIZED  Final   Special Requests Normal  Final   Culture NO GROWTH  Final   Report Status 03/07/2016 FINAL  Final  Respiratory Panel by PCR     Status: Abnormal   Collection Time: 03/13/16  9:57 AM  Result Value Ref Range Status   Adenovirus NOT DETECTED NOT DETECTED Final   Coronavirus 229E NOT DETECTED NOT DETECTED Final   Coronavirus HKU1 NOT DETECTED NOT DETECTED Final   Coronavirus NL63 NOT DETECTED NOT DETECTED Final   Coronavirus OC43 NOT DETECTED NOT DETECTED Final   Metapneumovirus NOT DETECTED NOT DETECTED Final   Rhinovirus / Enterovirus DETECTED (A) NOT DETECTED Final   Influenza A NOT DETECTED NOT DETECTED Final   Influenza A H1 NOT DETECTED NOT DETECTED Final   Influenza A H1 2009 NOT DETECTED NOT DETECTED Final   Influenza A H3 NOT DETECTED NOT DETECTED Final   Influenza B NOT DETECTED NOT DETECTED Final   Parainfluenza Virus 1 NOT DETECTED NOT DETECTED Final   Parainfluenza Virus 2 NOT DETECTED NOT DETECTED Final   Parainfluenza Virus 3 NOT DETECTED  NOT DETECTED Final   Parainfluenza Virus 4 NOT DETECTED NOT DETECTED Final   Respiratory Syncytial Virus NOT DETECTED NOT DETECTED Final   Bordetella pertussis NOT DETECTED NOT DETECTED Final   Chlamydophila pneumoniae NOT DETECTED NOT DETECTED Final   Mycoplasma pneumoniae NOT DETECTED NOT DETECTED Final     Labs: CBC:  Recent Labs Lab 03/08/16 0540  03/10/16 0428 03/11/16 03/12/16 0549 03/13/16 0508 03/14/16 0325  WBC 98.1*  < > 109.6* 99.9* 97.1* 88.6* 99.3*  NEUTROABS 8.8*  --   --   --   --  8.0*  --   HGB 13.9  < > 13.3 12.8* 13.5 12.7* 13.1  HCT 43.0  < > 41.7 41.5 44.1 41.1 41.2  MCV 82.4  < > 82.6 84.5 86.1 85.6 86.2  PLT 82*  < > 67* 63* 68* 70* 75*  < > = values in this interval not  displayed. Basic Metabolic Panel:  Recent Labs Lab 03/08/16 0540 03/09/16 1532 03/13/16 0508 03/14/16 0325  NA 135 137 144 143  K 4.5 4.6 4.3 3.6  CL 103 105 109 110  CO2 21* 23 25 26   GLUCOSE 100* 127* 111* 154*  BUN 13 15 23* 28*  CREATININE 1.00 1.02 1.04 1.10  CALCIUM 8.1* 8.2* 8.4* 8.4*  MG  --   --  2.2 2.3   Liver Function Tests:  Recent Labs Lab 03/13/16 0508  AST 58*  ALT 60  ALKPHOS 81  BILITOT 1.5*  PROT 5.6*  ALBUMIN 2.3*   No results for input(s): LIPASE, AMYLASE in the last 168 hours. No results for input(s): AMMONIA in the last 168 hours. Cardiac Enzymes:  Recent Labs Lab 03/09/16 1532 03/09/16 2225 03/10/16 0428  CKTOTAL  --  732*  --   TROPONINI 0.12* 0.08* 0.07*   Time spent: 30 minutes  Signed:  Hiep Ollis  [facility]  03/14/2016  , 2:59 PM

## 2016-03-14 NOTE — Progress Notes (Signed)
OT Cancellation Note  Patient Details Name: Dale George MRN: [id number] DOB: 1946-09-19   Cancelled Treatment:    Reason Eval/Treat Not Completed: Medical issues which prohibited therapy.  Pt is here with acute PE and heparin is not therapeutic this am.  Will check back another day.  Cathalina Barcia 03/14/2016, 8:15 AM  Lesle Chris, OTR/L [phone] 03/14/2016

## 2016-03-14 NOTE — Progress Notes (Addendum)
ANTICOAGULATION - Follow Up & ANTIBIOTIC - Initial CONSULT NOTE  Pharmacy Consult for Heparin >> Apixaban (see addendum) Indication: New acute PE, LUE DVT and RV thrombus  No Known Allergies  Patient Measurements: Height: 6\' 6"  (198.1 cm) Weight: (!) 306 lb (138.801 kg) IBW/kg (Calculated) : 91.4 Heparin Dosing Weight: 121.8 kg  Vital Signs: Temp: 97.7 F (36.5 C) (06/23 1000) Temp Source: Oral (06/23 1000) BP: 124/82 mmHg (06/23 1000) Pulse Rate: 112 (06/23 1000)  Labs:  Recent Labs  03/12/16 0549  03/13/16 0508 03/14/16 0325 03/14/16 1203  HGB 13.5  --  12.7* 13.1  --   HCT 44.1  --  41.1 41.2  --   PLT 68*  --  70* 75*  --   LABPROT  --   --  23.0*  --   --   INR  --   --  2.06*  --   --   HEPARINUNFRC  --   < > 0.45 0.93* 0.93*  CREATININE  --   --  1.04 1.10  --   < > = values in this interval not displayed.  Estimated Creatinine Clearance: 97.6 mL/min (by C-G formula based on Cr of 1.1).   Medications:  Heparin @ 2500 units/hr (27 ml/hr)  Assessment: 70 YOM with hx CLL who presented on 6/15 s/p fall with left arm pain, rhabdo, and rectal bleeding. Rectal bleeding since resolved. The patient was noted to have SOB and increased WOB with CTA on 6/18 showing PE and dopplers confirming LUE DVT. The patient is also noted via ECHO to have a RV thrombus.  Pharmacy has been consulted to start heparin for anticoagulation on 6/18.  Heparin level this afternoon remains SUPRAtherapeutic despite a rate decrease earlier today (HL 0.93 << 0.93, goal of 0.3-0.7). Hgb/Hct wnl, plts 75 << 70 (chronic thrombocytopenia noted). Per heme/onc - okay to continue heparin as long as plts>50. Some hematuria and rectal bleeding this admit - will continue to watch.   Goal of Therapy:  Heparin level 0.3-0.7 units/ml Monitor platelets by anticoagulation protocol: Yes    Plan:  1. Reduce Heparin to 2200 units/hr (22 ml/hr) 2. Will follow-up plans to transition to po AC once no longer  NPO 3. Will continue to monitor for any signs/symptoms of bleeding and will follow up with heparin level in 8 hours   Alycia Rossetti, PharmD, BCPS Clinical Pharmacist Pager: [phone] 03/14/2016 1:47 PM    ------------------------------------------------------------------------------------------------------ Addendum:  Plans are now to transition the patient to Eliquis for discharge today. Will plan to transition the patient around 1700 this afternoon. Discussed with MD Posey Pronto) that there is no current indication for RV thrombus - he is aware of this and has already discussed with heme/onc.  The patient was educated on apixaban today.  Goal of Therapy:  Appropriate anticoagulation for indication and hepatic/renal function  Plan 1. Stop Heparin at 1700 today 2. Give apixaban 10 mg po x 1 dose at 1700 3. Start apixaban 10 mg twice daily for 7 days starting on 6/24 and continuing through 6/30 4. On 7/1 - start apixaban 5 mg twice daily 5. MD planning discharge today - but while patient is inpatient, will continue to monitor platelets and signs/symptoms of bleeding  Alycia Rossetti, PharmD, BCPS Clinical Pharmacist Pager: [phone] 03/14/2016 2:02 PM

## 2016-03-14 NOTE — Discharge Instructions (Signed)
Information on my medicine - ELIQUIS (apixaban)  This medication education was reviewed with me or my healthcare representative as part of my discharge preparation.  The pharmacist that spoke with me during my hospital stay was:  Lawson Radar, [facility]  Why was Eliquis prescribed for you? Eliquis was prescribed to treat blood clots that may have been found in the veins of your legs (deep vein thrombosis) or in your lungs (pulmonary embolism) and to reduce the risk of them occurring again.  What do You need to know about Eliquis ? The starting dose is 10 mg (two 5 mg tablets) taken TWICE daily for the FIRST SEVEN (7) DAYS, then on Saturday, July 1st  the dose is reduced to ONE 5 mg tablet taken TWICE daily.  Eliquis may be taken with or without food.   Try to take the dose about the same time in the morning and in the evening. If you have difficulty swallowing the tablet whole please discuss with your pharmacist how to take the medication safely.  Take Eliquis exactly as prescribed and DO NOT stop taking Eliquis without talking to the doctor who prescribed the medication.  Stopping may increase your risk of developing a new blood clot.  Refill your prescription before you run out.  After discharge, you should have regular check-up appointments with your healthcare provider that is prescribing your Eliquis.    What do you do if you miss a dose? If a dose of ELIQUIS is not taken at the scheduled time, take it as soon as possible on the same day and twice-daily administration should be resumed. The dose should not be doubled to make up for a missed dose.  Important Safety Information A possible side effect of Eliquis is bleeding. You should call your healthcare provider right away if you experience any of the following: ? Bleeding from an injury or your nose that does not stop. ? Unusual colored urine (red or dark brown) or unusual colored stools (red or black). ? Unusual bruising  for unknown reasons. ? A serious fall or if you hit your head (even if there is no bleeding).  Some medicines may interact with Eliquis and might increase your risk of bleeding or clotting while on Eliquis. To help avoid this, consult your healthcare provider or pharmacist prior to using any new prescription or non-prescription medications, including herbals, vitamins, non-steroidal anti-inflammatory drugs (NSAIDs) and supplements.  This website has more information on Eliquis (apixaban): http://www.eliquis.com/eliquis/home

## 2016-03-14 NOTE — Clinical Social Work Note (Signed)
CSW informed by Narda Rutherford at [facility] that insurance authorization received for patient. Weekend handoff completed informed CSW that patient may discharge over the weekend.  Shamina Etheridge Givens, MSW, LCSW Licensed Clinical Social Worker [place] [phone]

## 2016-03-14 NOTE — Care Management Important Message (Signed)
Important Message  Patient Details  Name: Dale George MRN: [id number] Date of Birth: 07-20-1946   Medicare Important Message Given:  Yes    [name] 03/14/2016, 9:57 AM

## 2016-03-14 NOTE — Plan of Care (Signed)
Problem: Health Behavior/Discharge Planning: Goal: Ability to manage health-related needs will improve Outcome: Adequate for Discharge Being d/c'd to [facility] today.  Problem: Skin Integrity: Goal: Risk for impaired skin integrity will decrease Outcome: Adequate for Discharge Patient with multiple wounds being addressed by [facility] nurse.  Problem: Nutrition: Goal: Adequate nutrition will be maintained Outcome: Adequate for Discharge Poor PO intake.

## 2016-03-14 NOTE — Care Management Note (Signed)
Case Management Note  Patient Details  Name: Dale George MRN: [id number] Date of Birth: [birth year]  Subjective/Objective:   CM following for progression and d/c planning.                 Action/Plan: 03/14/2016 Spoke with pt daughter, re plan to d/c pt to SNF, no HH or DME needs at this time.  Expected Discharge Date:  03/14/2016               Expected Discharge Plan:  [place]  In-House Referral:  Clinical Social Work  Discharge planning Services  CM Consult  Post Acute Care Choice:   NA Choice offered to:   NA  DME Arranged:   NA DME Agency:   NA  HH Arranged:   NA HH Agency:   NA  Status of Service:  Completed, signed off  If discussed at [employer] of Stay Meetings, dates discussed:    Additional Comments:  Adron Bene, RN 03/14/2016, 3:59 PM

## 2016-03-14 NOTE — Progress Notes (Signed)
Nutrition Follow-up  DOCUMENTATION CODES:   Non-severe (moderate) malnutrition in context of chronic illness, Obesity unspecified  INTERVENTION:  Continue regular diet for comfort.   Plan for discharge today.  NUTRITION DIAGNOSIS:   Inadequate oral intake related to inability to eat as evidenced by NPO status; diet advanced; ongoing  GOAL:   Patient will meet greater than or equal to 90% of their needs; not met  MONITOR:   PO intake, Weight trends, Labs, I & O's  REASON FOR ASSESSMENT:   Low Braden    ASSESSMENT:   70 y.o. 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 Stable 4 mm anterolisthesis at C6-7; severe degenerative changes in the cervical spine.  MBS done this AM. Pt with severe aspiration risk, however daughter wants pt to be on a regular diet with thin liquids. Daughter aware of risks and aspiration. Pt reports he has been eating fine PTA with usual consumption of at least 2-3 meals daily. Usual body weight unknown. Per Epic weight records, pt with a 7.6% weight loss in 3 months. Plans for discharge today with palliative care support.   Nutrition-Focused physical exam completed. Findings are no fat depletion, mild to moderate muscle depletion, and moderate edema.   Labs and medications reviewed.   Diet Order:  Diet regular Room service appropriate?: Yes; Fluid consistency:: Thin Diet - low sodium heart healthy  Skin:  Wound (see comment) (DTI on L hip)  Last BM:  6/19  Height:   Ht Readings from Last 1 Encounters:  03/06/16 6' 6"  (1.981 m)     Weight:   Wt Readings from Last 1 Encounters:  03/12/16 306 lb (138.801 kg)    Ideal Body Weight:  97 kg  BMI:  Body mass index is 35.37 kg/(m^2).  Estimated Nutritional Needs:   Kcal:  2200-2450  Protein:  115-130 grams  Fluid:  Per MD  EDUCATION NEEDS:   No education needs identified at this time  Corrin Parker, MS, RD, LDN Pager # [phone] After hours/ weekend pager # [phone]

## 2016-03-14 NOTE — Progress Notes (Signed)
Report called to Tanya at [facility]. PIV x2 removed per protocol. Telemetry #13 removed from patient and returned to nurse's station. Patient's daughter at bedside. CSW to arrange for [facility]AR transportation.   Joellen Jersey, RN.

## 2016-03-14 NOTE — Progress Notes (Signed)
ANTICOAGULATION CONSULT NOTE Pharmacy Consult for Heparin Indication: New acute PE, LUE DVT and RV thrombus  No Known Allergies  Patient Measurements: Height: 6\' 6"  (198.1 cm) Weight: (!) 306 lb (138.801 kg) IBW/kg (Calculated) : 91.4 Heparin Dosing Weight: 121.8 kg  Vital Signs: Temp: 97.5 F (36.4 C) (06/22 2035) Temp Source: Oral (06/22 2035) BP: 141/90 mmHg (06/22 2035) Pulse Rate: 61 (06/22 2035)  Labs:  Recent Labs  03/12/16 0549  03/12/16 2025 03/13/16 0508 03/14/16 0325  HGB 13.5  --   --  12.7* 13.1  HCT 44.1  --   --  41.1 41.2  PLT 68*  --   --  70* 75*  LABPROT  --   --   --  23.0*  --   INR  --   --   --  2.06*  --   HEPARINUNFRC  --   < > 0.70 0.45 0.93*  CREATININE  --   --   --  1.04  --   < > = values in this interval not displayed.  Estimated Creatinine Clearance: 103.2 mL/min (by C-G formula based on Cr of 1.04).   Medications:  Heparin @ 2700 units/hr (27 ml/hr)  Assessment: 47 YOM with hx CLL who presented on 6/15 s/p fall with left arm pain, rhabdo, and rectal bleeding. Rectal bleeding since resolved. The patient was noted to have SOB and increased WOB with CTA on 6/18 showing PE and dopplers confirming LUE DVT. The patient is also noted via ECHO to have a RV thrombus.  Pharmacy has been consulted to start heparin for anticoagulation on 6/18.  Heparin level this morning is elevated. INR elevated at 2.06 - unclear indication for this, possibly due to recent rhabdo and slight elevation of LFTs? Hgb/Hct stable, plts 75 (chronic thrombocytopenia noted). Per heme/onc - okay to continue heparin as long as plts>50. Some hematuria and rectal bleeding this admit - will continue to watch.   Goal of Therapy:  Heparin level 0.3-0.7 units/ml Monitor platelets by anticoagulation protocol: Yes   Plan:  -Decrease heparin to 2500 units/hr -1200 HL  Narda Bonds, PharmD Clinical Pharmacist

## 2016-03-14 NOTE — Telephone Encounter (Signed)
s.w. pt dtr and advised on July appt....ok and aware

## 2016-03-14 NOTE — Consult Note (Addendum)
WOC wound re-consult note WOC consult was performed on 6/16; refer to previous progress notes. Reason for Consult: Bilat buttocks and lip Wound type: Right lip with dark purple mucosal injury of unknown eitology; .3X.3cm, no open wound. Left hip with previously noted deep tissue injury, present on admission, has evolved into stage 2 pressure injury at this time.  8X2.5X.1cm, pink and dry, no odor or drainage. Bilat buttocks with previously noted moisture associated skin damage. These are NOT pressure injuries. Area has evolved into partial thickness skin loss to bilat buttocks: Left side 5X8X.1cm, red patchy areas with small amt dry yellow crusted scabs and scant amt yellow drainage. Inner gluteal fold 8X3X.1cm, pink and moist with dry yellow scabbed edges and scant amt yellow drainage.  It is difficult to keep wounds from becoming soiled related to incontinence.  Barrier cream to protect and promote healing.  Foam dressing to left hip. Pt is on an air mattress to decrease pressure to the affected areas. Vaseline to promote healing to lip.  No family members present ot discuss plan of care. Please re-consult if further assistance is needed.  Thank-you,  Julien Girt MSN, [place], [place], [place], [place]

## 2016-03-14 NOTE — Progress Notes (Signed)
MBSS complete. Full report located under chart review in imaging section. SLP communicated findings to pts daughter. She has verbalized she would like the pt to consume foods and liquids of choice with known risk of aspiration. Discussed with MD.  Herbie Baltimore, [place] CCC-SLP [phone]

## 2016-03-14 NOTE — Clinical Social Work Placement (Signed)
   CLINICAL SOCIAL WORK PLACEMENT  NOTE 03/14/16 - DISCHARGED TO [facility]  Date:  03/14/2016  Patient Details  Name: Dale George MRN: [id number] Date of Birth: Jul 28, 1946  Clinical Social Work is seeking post-discharge placement for this patient at the [place] level of care (*CSW will initial, date and re-position this form in  chart as items are completed):  Yes   Patient/family provided with [place] Work Department's list of facilities offering this level of care within the geographic area requested by the patient (or if unable, by the patient's family).  Yes   Patient/family informed of their freedom to choose among providers that offer the needed level of care, that participate in Medicare, Medicaid or managed care program needed by the patient, have an available bed and are willing to accept the patient.  Yes   Patient/family informed of [facility]'s ownership interest in [facility] and [facility], as well as of the fact that they are under no obligation to receive care at these facilities.  PASRR submitted to EDS on       PASRR number received on       Existing PASRR number confirmed on 03/11/16     FL2 transmitted to all facilities in geographic area requested by pt/family on 03/11/16     FL2 transmitted to all facilities within larger geographic area on       Patient informed that his/her managed care company has contracts with or will negotiate with certain facilities, including the following:        Yes   Patient/family informed of bed offers received.  Patient chooses bed at [facility]     Physician recommends and patient chooses bed at      Patient to be transferred to  [facility] on  03/14/16.  Patient to be transferred to facility by  ambulance     Patient/family notified on  Kurtis Bushman of transfer.  Name of family member notified:   Daughter Kurtis Bushman at the bedside.      PHYSICIAN       Additional Comment:    _______________________________________________ [name], LCSW 03/14/2016, 3:14 PM

## 2016-03-26 ENCOUNTER — Telehealth: Payer: Self-pay | Admitting: Hematology and Oncology

## 2016-03-26 NOTE — Telephone Encounter (Signed)
s.w. pt dtr and she wanted to cx appt due to pt in [facility] and not able to get around....she will call back to r/s

## 2016-03-27 ENCOUNTER — Ambulatory Visit: Payer: Medicare PPO | Admitting: Hematology and Oncology

## 2016-03-27 ENCOUNTER — Other Ambulatory Visit: Payer: Medicare PPO

## 2016-04-23 ENCOUNTER — Encounter (HOSPITAL_COMMUNITY): Payer: Self-pay | Admitting: Emergency Medicine

## 2016-04-23 ENCOUNTER — Emergency Department (HOSPITAL_COMMUNITY): Payer: Medicare PPO

## 2016-04-23 ENCOUNTER — Inpatient Hospital Stay (HOSPITAL_COMMUNITY): Payer: Medicare PPO

## 2016-04-23 ENCOUNTER — Inpatient Hospital Stay (HOSPITAL_COMMUNITY)
Admission: EM | Admit: 2016-04-23 | Discharge: 2016-04-29 | DRG: 871 | Disposition: A | Discharge status: Skilled Nursing Facility | Payer: Medicare PPO | Attending: Internal Medicine | Admitting: Internal Medicine

## 2016-04-23 DIAGNOSIS — A419 Sepsis, unspecified organism: Secondary | ICD-10-CM | POA: Diagnosis present

## 2016-04-23 DIAGNOSIS — Z7901 Long term (current) use of anticoagulants: Secondary | ICD-10-CM

## 2016-04-23 DIAGNOSIS — I959 Hypotension, unspecified: Secondary | ICD-10-CM | POA: Diagnosis present

## 2016-04-23 DIAGNOSIS — Z7951 Long term (current) use of inhaled steroids: Secondary | ICD-10-CM | POA: Diagnosis not present

## 2016-04-23 DIAGNOSIS — K149 Disease of tongue, unspecified: Secondary | ICD-10-CM | POA: Diagnosis present

## 2016-04-23 DIAGNOSIS — J690 Pneumonitis due to inhalation of food and vomit: Secondary | ICD-10-CM | POA: Diagnosis present

## 2016-04-23 DIAGNOSIS — K1379 Other lesions of oral mucosa: Secondary | ICD-10-CM | POA: Diagnosis not present

## 2016-04-23 DIAGNOSIS — R000 Tachycardia, unspecified: Secondary | ICD-10-CM | POA: Diagnosis not present

## 2016-04-23 DIAGNOSIS — L8990 Pressure ulcer of unspecified site, unspecified stage: Secondary | ICD-10-CM | POA: Diagnosis present

## 2016-04-23 DIAGNOSIS — R1310 Dysphagia, unspecified: Secondary | ICD-10-CM | POA: Diagnosis present

## 2016-04-23 DIAGNOSIS — L89320 Pressure ulcer of left buttock, unstageable: Secondary | ICD-10-CM | POA: Diagnosis present

## 2016-04-23 DIAGNOSIS — Z66 Do not resuscitate: Secondary | ICD-10-CM | POA: Diagnosis present

## 2016-04-23 DIAGNOSIS — Z86718 Personal history of other venous thrombosis and embolism: Secondary | ICD-10-CM | POA: Diagnosis not present

## 2016-04-23 DIAGNOSIS — C9110 Chronic lymphocytic leukemia of B-cell type not having achieved remission: Secondary | ICD-10-CM | POA: Diagnosis present

## 2016-04-23 DIAGNOSIS — Z79899 Other long term (current) drug therapy: Secondary | ICD-10-CM | POA: Diagnosis not present

## 2016-04-23 DIAGNOSIS — J984 Other disorders of lung: Secondary | ICD-10-CM | POA: Diagnosis not present

## 2016-04-23 DIAGNOSIS — E785 Hyperlipidemia, unspecified: Secondary | ICD-10-CM | POA: Diagnosis present

## 2016-04-23 DIAGNOSIS — Z85810 Personal history of malignant neoplasm of tongue: Secondary | ICD-10-CM

## 2016-04-23 DIAGNOSIS — J392 Other diseases of pharynx: Secondary | ICD-10-CM | POA: Diagnosis not present

## 2016-04-23 DIAGNOSIS — Z86711 Personal history of pulmonary embolism: Secondary | ICD-10-CM | POA: Diagnosis not present

## 2016-04-23 DIAGNOSIS — I129 Hypertensive chronic kidney disease with stage 1 through stage 4 chronic kidney disease, or unspecified chronic kidney disease: Secondary | ICD-10-CM | POA: Diagnosis present

## 2016-04-23 DIAGNOSIS — I4891 Unspecified atrial fibrillation: Secondary | ICD-10-CM | POA: Diagnosis not present

## 2016-04-23 DIAGNOSIS — D696 Thrombocytopenia, unspecified: Secondary | ICD-10-CM | POA: Diagnosis present

## 2016-04-23 DIAGNOSIS — F0390 Unspecified dementia without behavioral disturbance: Secondary | ICD-10-CM | POA: Diagnosis present

## 2016-04-23 DIAGNOSIS — R05 Cough: Secondary | ICD-10-CM

## 2016-04-23 DIAGNOSIS — Z87891 Personal history of nicotine dependence: Secondary | ICD-10-CM | POA: Diagnosis not present

## 2016-04-23 DIAGNOSIS — M109 Gout, unspecified: Secondary | ICD-10-CM | POA: Diagnosis present

## 2016-04-23 DIAGNOSIS — R058 Other specified cough: Secondary | ICD-10-CM

## 2016-04-23 DIAGNOSIS — N183 Chronic kidney disease, stage 3 (moderate): Secondary | ICD-10-CM | POA: Diagnosis present

## 2016-04-23 LAB — I-STAT CG4 LACTIC ACID, ED: Lactic Acid, Venous: 2.22 mmol/L (ref 0.5–1.9)

## 2016-04-23 LAB — URINALYSIS, ROUTINE W REFLEX MICROSCOPIC
Glucose, UA: NEGATIVE mg/dL
Ketones, ur: 15 mg/dL — AB
NITRITE: POSITIVE — AB
PH: 5.5 (ref 5.0–8.0)
Protein, ur: 30 mg/dL — AB
SPECIFIC GRAVITY, URINE: 1.03 (ref 1.005–1.030)

## 2016-04-23 LAB — COMPREHENSIVE METABOLIC PANEL
ALBUMIN: 3.4 g/dL — AB (ref 3.5–5.0)
ALK PHOS: 89 U/L (ref 38–126)
ALT: 21 U/L (ref 17–63)
AST: 32 U/L (ref 15–41)
Anion gap: 10 (ref 5–15)
BILIRUBIN TOTAL: 1.2 mg/dL (ref 0.3–1.2)
BUN: 20 mg/dL (ref 6–20)
CALCIUM: 8.9 mg/dL (ref 8.9–10.3)
CO2: 21 mmol/L — ABNORMAL LOW (ref 22–32)
CREATININE: 1.09 mg/dL (ref 0.61–1.24)
Chloride: 106 mmol/L (ref 101–111)
GFR calc Af Amer: >60 mL/min (ref >60)
GFR calc non Af Amer: >60 mL/min (ref >60)
GLUCOSE: 135 mg/dL — AB (ref 65–99)
Potassium: 6.4 mmol/L (ref 3.5–5.1)
Sodium: 137 mmol/L (ref 135–145)
TOTAL PROTEIN: 6.7 g/dL (ref 6.5–8.1)

## 2016-04-23 LAB — BASIC METABOLIC PANEL
ANION GAP: 8 (ref 5–15)
BUN: 21 mg/dL — AB (ref 6–20)
CALCIUM: 8.5 mg/dL — AB (ref 8.9–10.3)
CO2: 23 mmol/L (ref 22–32)
Chloride: 109 mmol/L (ref 101–111)
Creatinine, Ser: 1.04 mg/dL (ref 0.61–1.24)
GFR calc Af Amer: >60 mL/min (ref >60)
GFR calc non Af Amer: >60 mL/min (ref >60)
GLUCOSE: 120 mg/dL — AB (ref 65–99)
Potassium: 5.2 mmol/L — ABNORMAL HIGH (ref 3.5–5.1)
Sodium: 140 mmol/L (ref 135–145)

## 2016-04-23 LAB — URINE MICROSCOPIC-ADD ON

## 2016-04-23 LAB — I-STAT BETA HCG BLOOD, ED (MC, WL, AP ONLY): I-stat hCG, quantitative: <5 m[IU]/mL (ref <5)

## 2016-04-23 MED ORDER — IOPAMIDOL (ISOVUE-300) INJECTION 61%
INTRAVENOUS | Status: AC
  Administered 2016-04-23: 75 mL
  Filled 2016-04-23: qty 75

## 2016-04-23 MED ORDER — VANCOMYCIN HCL 10 G IV SOLR
2000.0000 mg | Freq: Once | INTRAVENOUS | Status: AC
  Administered 2016-04-23: 2000 mg via INTRAVENOUS
  Filled 2016-04-23: qty 2000

## 2016-04-23 MED ORDER — ACETAMINOPHEN 650 MG RE SUPP
650.0000 mg | Freq: Once | RECTAL | Status: AC
  Administered 2016-04-23: 650 mg via RECTAL
  Filled 2016-04-23: qty 1

## 2016-04-23 MED ORDER — SODIUM CHLORIDE 0.9 % IV BOLUS (SEPSIS)
1000.0000 mL | Freq: Once | INTRAVENOUS | Status: AC
  Administered 2016-04-23: 1000 mL via INTRAVENOUS

## 2016-04-23 MED ORDER — SODIUM CHLORIDE 0.9 % IV BOLUS (SEPSIS)
500.0000 mL | Freq: Once | INTRAVENOUS | Status: AC
Start: 2016-04-23 — End: 2016-04-24
  Administered 2016-04-24: 500 mL via INTRAVENOUS

## 2016-04-23 MED ORDER — PIPERACILLIN-TAZOBACTAM 3.375 G IVPB
3.3750 g | Freq: Three times a day (TID) | INTRAVENOUS | Status: DC
  Administered 2016-04-24 – 2016-04-26 (×7): 3.375 g via INTRAVENOUS
  Filled 2016-04-23 (×9): qty 50

## 2016-04-23 MED ORDER — ENOXAPARIN SODIUM 150 MG/ML ~~LOC~~ SOLN
135.0000 mg | SUBCUTANEOUS | Status: AC
  Administered 2016-04-24: 135 mg via SUBCUTANEOUS
  Filled 2016-04-23 (×2): qty 0.9

## 2016-04-23 MED ORDER — ACETAMINOPHEN 650 MG RE SUPP
650.0000 mg | Freq: Once | RECTAL | Status: AC
  Administered 2016-04-28: 650 mg via RECTAL

## 2016-04-23 MED ORDER — ENOXAPARIN SODIUM 150 MG/ML ~~LOC~~ SOLN
135.0000 mg | Freq: Two times a day (BID) | SUBCUTANEOUS | Status: DC
  Administered 2016-04-24 – 2016-04-25 (×2): 135 mg via SUBCUTANEOUS
  Filled 2016-04-23 (×2): qty 0.9

## 2016-04-23 MED ORDER — VANCOMYCIN HCL 10 G IV SOLR
1250.0000 mg | Freq: Two times a day (BID) | INTRAVENOUS | Status: DC
  Administered 2016-04-24 (×2): 1250 mg via INTRAVENOUS
  Filled 2016-04-23 (×4): qty 1250

## 2016-04-23 MED ORDER — ACETAMINOPHEN 650 MG RE SUPP
650.0000 mg | RECTAL | Status: DC | PRN
  Filled 2016-04-23: qty 1

## 2016-04-23 MED ORDER — PIPERACILLIN-TAZOBACTAM 3.375 G IVPB 30 MIN
3.3750 g | Freq: Once | INTRAVENOUS | Status: AC
  Administered 2016-04-23: 3.375 g via INTRAVENOUS
  Filled 2016-04-23: qty 50

## 2016-04-23 NOTE — Progress Notes (Signed)
Pharmacy Antibiotic Note Dale George is a 70 y.o. male admitted on 04/23/2016 with sepsis.  Pharmacy has been consulted for Zosyn and  vancomycin dosing.  Plan: 1. Zosyn 3.375g IV q8h (4 hour infusion).  2. Vancomycin 2000 mg x 1 followed by 1250 mg every 12 hours starting on 8/3 am.  Weight: 300 lb (136.1 kg)  Temp (24hrs), Avg:99.3 F (37.4 C), Min:98 F (36.7 C), Max:100.5 F (38.1 C)   Recent Labs Lab 04/23/16 1900 04/23/16 1924  WBC 114.0*  --   CREATININE 1.09  --   LATICACIDVEN  --  2.22*    Estimated Creatinine Clearance: 97.5 mL/min (by C-G formula based on SCr of 1.09 mg/dL).    No Known Allergies  Antimicrobials this admission: 8/2 Zosyn >>  8/2 Vancomycin >>   Dose adjustments this admission: n/a  Microbiology results: 8/2 BCx: px 8/2 UCx: px    Thank you for allowing pharmacy to be a part of this patient's care.  Vincenza Hews, PharmD, BCPS 04/23/2016, 8:13 PM Pager: [phone]

## 2016-04-23 NOTE — Progress Notes (Signed)
ANTICOAGULATION CONSULT NOTE - Initial Consult  Pharmacy Consult for Lovenox Indication: pulmonary embolus and DVT  No Known Allergies  Patient Measurements: Weight: 300 lb (136.1 kg)  Vital Signs: Temp: 100.5 F (38.1 C) (08/02 1910) Temp Source: Rectal (08/02 1910) BP: 106/65 (08/02 2300) Pulse Rate: 124 (08/02 2300)  Labs:  Recent Labs  04/23/16 1900 04/23/16 2124  HGB 15.7  --   HCT 47.8  --   PLT 91*  --   CREATININE 1.09 1.04    Estimated Creatinine Clearance: 102.2 mL/min (by C-G formula based on SCr of 1.04 mg/dL).   Medical History: Past Medical History:  Diagnosis Date  . Ataxia   . Cancer (HCC)    tongue cancer  . Chronic kidney disease    Stage 3  . CLL (chronic lymphocytic leukemia) ([place]) 12/23/2013  . Dementia   . Fatigue 12/14/2013  . Gout   . Hyperlipidemia   . Hypertension   . Insomnia   . Lymphocytosis 12/14/2013     Assessment: 70yo male admitted for sepsis cannot take PO, had recent PE/DVT, on Eliquis, to transition to LMWH while unable to take PO; spoke w/ [facility] nurse who reports last dose of Eliquis was at 0800 on 8/2.  Goal of Therapy:  Anti-Xa level 0.6-1 units/ml 4hrs after LMWH dose given Monitor platelets by anticoagulation protocol: Yes   Plan:  Will start Lovenox 135mg  SQ Q12H and monitor CBC, f/u when to transition back to PO.  Wynona Neat, PharmD, BCPS  04/23/2016,11:52 PM

## 2016-04-23 NOTE — ED Provider Notes (Signed)
[facility] DEPT Provider Note   CSN: [id number] Arrival date & time: 04/23/16  1843  First Provider Contact:  None       History   Chief Complaint Chief Complaint  Patient presents with  . Atrial Fibrillation    HPI Dale George is a 70 y.o. male.  HPI  patient from [facility] facility. Apparently patient is having difficulty with weakness and a cough for the last 2-3 days. Had an x-ray yesterday was reported as normal. History of CLL W GCS 86,000. Tachycardic today with increasing dyspnea transferred here recent UTI.  Past Medical History:  Diagnosis Date  . Ataxia   . Cancer (HCC)    tongue cancer  . Chronic kidney disease    Stage 3  . 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 Dementia   . Fatigue 12/14/2013  . Gout   . Hyperlipidemia   . Hypertension   . Insomnia   . Lymphocytosis 12/14/2013    Patient Active Problem List   Diagnosis Date Noted  . Rhinovirus infection 03/13/2016  . Dysphagia 03/12/2016  . Acute pulmonary embolism ([place]) 03/11/2016  . Acute deep vein thrombosis (DVT) of brachial vein of left upper extremity ([place]) 03/11/2016  . Pressure ulcer 03/06/2016  . Rhabdomyolysis 03/05/2016  . Rectal bleeding 03/05/2016  . Sepsis ([place]) 03/05/2016  . Left arm pain 03/05/2016  . CKD (chronic kidney disease), stage III 03/05/2016  . Essential hypertension 06/26/2015  . Hyperkalemia 03/30/2015  . Fall   . Acute kidney injury ([place]) 03/29/2015  . AKI (acute kidney injury) ([place]) 03/29/2015  . Candidiasis of mouth 12/25/2014  . Other fatigue 12/25/2014  . Thrombocytopenia ([place]) 09/25/2014  . 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 Lymphocytosis 12/14/2013  . Fatigue 12/14/2013  . History of tongue cancer 12/14/2013  . Hyperlipidemia 03/11/2013  . HTN (hypertension) 03/11/2013  . Arthritis 03/11/2013  . History of gout 03/11/2013    Past Surgical History:  Procedure Laterality Date  . LYMPH NODE  DISSECTION         Home Medications    Prior to Admission medications   Medication Sig Start Date End Date Taking? Authorizing Provider  albuterol-ipratropium (COMBIVENT) 18-103 MCG/ACT inhaler Inhale 2 puffs into the lungs every 6 (six) hours. 03/14/16   Lavina Hamman, MD  apixaban (ELIQUIS) 5 MG TABS tablet Take 2 tablets (10 mg total) by mouth 2 (two) times daily. 03/15/16 03/21/16  Lavina Hamman, MD  apixaban (ELIQUIS) 5 MG TABS tablet Take 1 tablet (5 mg total) by mouth 2 (two) times daily. 03/22/16   Lavina Hamman, MD  chlorhexidine (PERIDEX) 0.12 % solution 15 mLs by Mouth Rinse route 2 (two) times daily. 03/14/16   Lavina Hamman, MD  levalbuterol Penne Lash) 0.63 MG/3ML nebulizer solution Take 3 mLs (0.63 mg total) by nebulization every 6 (six) hours as needed for wheezing or shortness of breath. 03/14/16   Lavina Hamman, MD  levofloxacin (LEVAQUIN) 750 MG tablet Take 1 tablet (750 mg total) by mouth daily. 03/15/16   Lavina Hamman, MD  metoprolol tartrate (LOPRESSOR) 25 MG tablet Take 1 tablet (25 mg total) by mouth 2 (two) times daily. 03/14/16   Lavina Hamman, MD  nystatin ointment (MYCOSTATIN) Apply topically 2 (two) times daily. 03/14/16   Lavina Hamman, MD  predniSONE (DELTASONE) 10 MG tablet Take 40mg  daily for 3days,Take 30mg  daily for 3days,Take 20mg  daily for 3days,Take 10mg  daily for 3days, then stop. 03/14/16   Pranav  Jerilynn Som, MD  traMADol (ULTRAM) 50 MG tablet Take 1 tablet (50 mg total) by mouth every 12 (twelve) hours as needed. 03/14/16   Lavina Hamman, MD    Family History Family History  Problem Relation Age of Onset  . Tuberculosis Mother     Social History Social History  Substance Use Topics  . Smoking status: Former Smoker    Packs/day: 2.00    Years: 10.00    Quit date: 09/22/1981  . Smokeless tobacco: Never Used  . Alcohol use 1.2 oz/week    2 Standard drinks or equivalent per week     Allergies   Review of patient's allergies indicates no known  allergies.   Review of Systems Review of Systems  Constitutional: Positive for fatigue and fever. Negative for appetite change, chills and diaphoresis.  HENT: Positive for congestion. Negative for mouth sores, sore throat and trouble swallowing.   Eyes: Negative for visual disturbance.  Respiratory: Positive for cough and shortness of breath. Negative for chest tightness and wheezing.   Cardiovascular: Negative for chest pain.  Gastrointestinal: Positive for nausea. Negative for abdominal distention, abdominal pain, diarrhea and vomiting.  Endocrine: Negative for polydipsia, polyphagia and polyuria.  Genitourinary: Negative for dysuria, frequency and hematuria.  Musculoskeletal: Negative for gait problem.  Skin: Negative for color change, pallor and rash.  Neurological: Positive for weakness. Negative for dizziness, syncope, light-headedness and headaches.  Hematological: Does not bruise/bleed easily.  Psychiatric/Behavioral: Negative for behavioral problems and confusion.     Physical Exam Updated Vital Signs BP 102/67   Pulse 111   Temp 100.5 F (38.1 C) (Rectal)   Resp (!) 27   Wt 300 lb (136.1 kg)   SpO2 98%   BMI 34.67 kg/m   Physical Exam  Constitutional: He is oriented to person, place, and time. He appears well-developed and well-nourished. He appears ill. No distress.  Ill-appearing large stature elderly male. Somewhat hard of hearing. Able to answer simple questioning.  HENT:  Head: Normocephalic.  Eyes: Conjunctivae are normal. Pupils are equal, round, and reactive to light. No scleral icterus.  Neck: Normal range of motion. Neck supple. No thyromegaly present.  Cardiovascular: Regular rhythm.  Tachycardia present.  Exam reveals no gallop and no friction rub.   No murmur heard. Tachycardic. Regular  Pulmonary/Chest: Effort normal. No respiratory distress. He has no wheezes. He has rhonchi in the right upper field, the right middle field, the right lower field,  the left upper field, the left middle field and the left lower field. He has no rales.  Auditory transmitted upper breath sounds. Diffuse scattered rhonchi bilaterally. No focal diminished breath sounds  Abdominal: Soft. Bowel sounds are normal. He exhibits no distension. There is no tenderness. There is no rebound.  Musculoskeletal: Normal range of motion.  Neurological: He is alert and oriented to person, place, and time.  Skin: Skin is warm and dry. No rash noted.     Psychiatric: He has a normal mood and affect. His behavior is normal.     ED Treatments / Results  Labs (all labs ordered are listed, but only abnormal results are displayed) Labs Reviewed  COMPREHENSIVE METABOLIC PANEL - Abnormal; Notable for the following:       Result Value   Potassium 6.4 (*)    CO2 21 (*)    Glucose, Bld 135 (*)    Albumin 3.4 (*)    All other components within normal limits  CBC WITH DIFFERENTIAL/PLATELET - Abnormal; Notable for the following:  WBC 114.0 (*)    RDW 17.4 (*)    Platelets 91 (*)    Neutro Abs 13.7 (*)    Lymphs Abs 96.9 (*)    Monocytes Absolute 3.4 (*)    All other components within normal limits  URINALYSIS, ROUTINE W REFLEX MICROSCOPIC (NOT AT [facility]) - Abnormal; Notable for the following:    Color, Urine ORANGE (*)    APPearance CLOUDY (*)    Hgb urine dipstick LARGE (*)    Bilirubin Urine MODERATE (*)    Ketones, ur 15 (*)    Protein, ur 30 (*)    Nitrite POSITIVE (*)    Leukocytes, UA SMALL (*)    All other components within normal limits  URINE MICROSCOPIC-ADD ON - Abnormal; Notable for the following:    Squamous Epithelial / LPF 0-5 (*)    Bacteria, UA MANY (*)    All other components within normal limits  BASIC METABOLIC PANEL - Abnormal; Notable for the following:    Potassium 5.2 (*)    Glucose, Bld 120 (*)    BUN 21 (*)    Calcium 8.5 (*)    All other components within normal limits  I-STAT CG4 LACTIC ACID, ED - Abnormal; Notable for the following:      Lactic Acid, Venous 2.22 (*)    All other components within normal limits  CULTURE, BLOOD (ROUTINE X 2)  CULTURE, BLOOD (ROUTINE X 2)  URINE CULTURE  CULTURE, EXPECTORATED SPUTUM-ASSESSMENT  CK  I-STAT BETA HCG BLOOD, ED ([facility], [facility], [facility] ONLY)  I-STAT CG4 LACTIC ACID, ED  I-STAT CG4 LACTIC ACID, ED  I-STAT CG4 LACTIC ACID, ED  I-STAT CG4 LACTIC ACID, ED    EKG  EKG Interpretation  Date/Time:  Wednesday April 23 2016 18:53:36 EDT Ventricular Rate:  141 PR Interval:    QRS Duration: 104 QT Interval:  316 QTC Calculation: 484 R Axis:   -54 Text Interpretation:  Sinus tachycardia with irregular rate Left anterior fascicular block Abnormal R-wave progression, late transition Repolarization abnormality, prob rate related Confirmed by Jeneen Rinks  MD, [place] ([id number]) on 04/23/2016 8:16:53 PM       Radiology Dg Chest Port 1 View  Result Date: 04/23/2016 CLINICAL DATA:  70 year old male with productive cough for several days. Shortness of breath after IV fluids. Initial encounter. EXAM: PORTABLE CHEST 1 VIEW COMPARISON:  1932 hours today and earlier FINDINGS: Portable AP semi upright view at 2143 hours. Increased left lung base opacity, now mostly obscuring the left hemidiaphragm. Stable lung volumes. No pneumothorax or pulmonary edema. No definite pleural effusion. Stable cardiac size and mediastinal contours. IMPRESSION: Interval increased left lung base opacity suspicious for pneumonia, or possibly aspiration in this setting. No pleural effusion identified. Electronically Signed   By: Genevie Ann M.D.   On: 04/23/2016 21:59   Dg Chest Port 1 View  Result Date: 04/23/2016 CLINICAL DATA:  Cough, sepsis EXAM: PORTABLE CHEST 1 VIEW COMPARISON:  03/12/2016 FINDINGS: Lungs are clear.  No pleural effusion or pneumothorax. The heart is normal in size. IMPRESSION: No evidence of acute cardiopulmonary disease. Electronically Signed   By: Julian Hy M.D.   On: 04/23/2016 19:47    Procedures Procedures  (including critical care time)  Medications Ordered in ED Medications  sodium chloride 0.9 % bolus 1,000 mL (0 mLs Intravenous Stopped 04/23/16 2030)    And  sodium chloride 0.9 % bolus 1,000 mL (0 mLs Intravenous Stopped 04/23/16 2124)    And  sodium chloride 0.9 % bolus  1,000 mL (1,000 mLs Intravenous New Bag/Given 04/23/16 2329)    And  sodium chloride 0.9 % bolus 1,000 mL (0 mLs Intravenous Stopped 04/23/16 2320)    And  sodium chloride 0.9 % bolus 500 mL (not administered)  vancomycin (VANCOCIN) 1,250 mg in sodium chloride 0.9 % 250 mL IVPB (not administered)  piperacillin-tazobactam (ZOSYN) IVPB 3.375 g (not administered)  acetaminophen (TYLENOL) suppository 650 mg (650 mg Rectal Not Given 04/23/16 2119)  iopamidol (ISOVUE-300) 61 % injection (not administered)  acetaminophen (TYLENOL) suppository 650 mg (not administered)  enoxaparin (LOVENOX) injection 135 mg (not administered)  piperacillin-tazobactam (ZOSYN) IVPB 3.375 g (0 g Intravenous Stopped 04/23/16 2042)  acetaminophen (TYLENOL) suppository 650 mg (650 mg Rectal Given 04/23/16 1925)  vancomycin (VANCOCIN) 2,000 mg in sodium chloride 0.9 % 500 mL IVPB (0 mg Intravenous Stopped 04/23/16 2212)     Initial Impression / Assessment and Plan / ED Course  I have reviewed the triage vital signs and the nursing notes.  Pertinent labs & imaging results that were available during my care of the patient were reviewed by me and considered in my medical decision making (see chart for details).  Clinical Course    Rectal temp 100.8. Tachycardic. Code sepsis initiated. Fluid boluses initiated. Had increasing dyspnea. Initial chest x-ray shows no abnormality. Repeat shows no frank CHF however fluids were slowed and patient had an oxygen requirement of 2 L. Given Tylenol. Blood cultures obtained. Given vancomycin and Zosyn. Source may be skin with the sacral decubitus. May be UTI. Discussed with internal medicine resident patient be admitted to a  stepdown bed.  Final Clinical Impressions(s) / ED Diagnoses   Final diagnoses:  Cough productive of purulent sputum  Sepsis, due to unspecified organism ([place])    CRITICAL CARE Performed by: Tanna Furry JOSEPH   Total critical care time: 60 minutes  Critical care time was exclusive of separately billable procedures and treating other patients.  Critical care was necessary to treat or prevent imminent or life-threatening deterioration.  Critical care was time spent personally by me on the following activities: development of treatment plan with patient and/or surrogate as well as nursing, discussions with consultants, evaluation of patient's response to treatment, examination of patient, obtaining history from patient or surrogate, ordering and performing treatments and interventions, ordering and review of laboratory studies, ordering and review of radiographic studies, pulse oximetry and re-evaluation of patient's condition.   New Prescriptions New Prescriptions   No medications on file     Tanna Furry, MD 04/23/16 2348

## 2016-04-23 NOTE — H&P (Signed)
Date: 04/23/2016               Patient Name:  Dale George MRN: [id number]  DOB: 1946-08-01 Age / Sex: 70 y.o., male   PCP: Maury Dus, MD         Medical Service: Internal Medicine Teaching Service         Attending Physician: Dr. Bartholomew Crews, MD    First Contact: Dr. Ledell Noss, MD Pager: [phone]  Second Contact: Dr. Charlott Rakes, MD Pager: [phone]       After Hours (After 5p/  First Contact Pager: [phone]  weekends / holidays): Second Contact Pager: [phone]   Chief Complaint: productive cough, tachcyardia  History of Present Illness: [name] is a 70 year old male with MHx significant for dementia, CLL under observation, remote hx of tongue cancer (s/p radical neck surgery), CHF, HTN and recent bilateral PE with BL femoral DVT, left UE DVT and RV thrombus who presents for evaluation from [facility] facility of productive cough. The patient is a poor historian, HPI populated from ED notes and information gathered from the patients daughter unless otherwise stated. The patient reports that he's had a sore throat, a cough and shortness of breath x 2 days. Per pts daughter, pt called her yesterday and stated he felt like he was getting a cold. Daughter noted he sounded more "rattly" than usual. The facility reported patient has been tachcyardic (130-170bpm) and tachypnec (30) and has been producing thick green sputum.  He denies any subjective fevers, chills, difficulty swallowing, nausea, vomiting, chest pain, abdominal pain, dysuria, hematuria, diarrhea or constipation.   Meds:  No current facility-administered medications on file prior to encounter.    Current Outpatient Prescriptions on File Prior to Encounter  Medication Sig Dispense Refill  . albuterol-ipratropium (COMBIVENT) 18-103 MCG/ACT inhaler Inhale 2 puffs into the lungs every 6 (six) hours. 1 each 0  . apixaban (ELIQUIS) 5 MG TABS tablet Take 2 tablets (10 mg total) by mouth 2 (two) times daily. 13  tablet 0  . apixaban (ELIQUIS) 5 MG TABS tablet Take 1 tablet (5 mg total) by mouth 2 (two) times daily. 60 tablet 0  . chlorhexidine (PERIDEX) 0.12 % solution 15 mLs by Mouth Rinse route 2 (two) times daily. 120 mL 0  . levalbuterol (XOPENEX) 0.63 MG/3ML nebulizer solution Take 3 mLs (0.63 mg total) by nebulization every 6 (six) hours as needed for wheezing or shortness of breath. 3 mL 0  . levofloxacin (LEVAQUIN) 750 MG tablet Take 1 tablet (750 mg total) by mouth daily. 3 tablet 0  . metoprolol tartrate (LOPRESSOR) 25 MG tablet Take 1 tablet (25 mg total) by mouth 2 (two) times daily. 60 tablet 0  . nystatin ointment (MYCOSTATIN) Apply topically 2 (two) times daily. 30 g 0  . predniSONE (DELTASONE) 10 MG tablet Take 40mg  daily for 3days,Take 30mg  daily for 3days,Take 20mg  daily for 3days,Take 10mg  daily for 3days, then stop. 30 tablet 0  . traMADol (ULTRAM) 50 MG tablet Take 1 tablet (50 mg total) by mouth every 12 (twelve) hours as needed. 10 tablet 0   Allergies: Allergies as of 04/23/2016  . (No Known Allergies)   Past Medical History:  Diagnosis Date  . Ataxia   . Cancer (HCC)    tongue cancer  . Chronic kidney disease    Stage 3  . CLL (chronic lymphocytic leukemia) ([place]) 12/23/2013  . Dementia   . Fatigue 12/14/2013  . Gout   .  Hyperlipidemia   . Hypertension   . Insomnia   . Lymphocytosis 12/14/2013    Family History:  Mother: Tuberculosis Denies any other FHx  Social History:  Tobacco: former smoker, 2pks/day x10 years, quit in 1983 Alcohol: occasional Denies any recreational drug use  Review of Systems: A complete ROS was negative except as per HPI.   Physical Exam: Blood pressure 102/67, pulse 111, temperature 100.5 F (38.1 C), temperature source Rectal, resp. rate (!) 27, weight 136.1 kg (300 lb), SpO2 98 %. General: Chronically ill appearing male. Alert, awake, in mild distress.  ENT: Yellow-green purulent sputum in oral cavity. Patient used suction several  times during examination.  Neck: Evidence of neck surgery. Palpation revealed 1 firm nodule under left jaw. No visible abscesses or sinus tracts.  Cardiovascular: Tachycardic, rate 120's. Irregular during examination, frequent ectopic beats Respiratory: Coarse breath sounds BL. No localized crackles or wheezes.  Abdomen: soft, non-tender. +bowel sounds Extremities: without edema Skin: sacral decubiti with some surrounding erythema. Not full thickness  EKG:   EKG Interpretation  Date/Time:  Wednesday April 23 2016 18:53:36 EDT Ventricular Rate:  141 PR Interval:    QRS Duration: 104 QT Interval:  316 QTC Calculation: 484 R Axis:   -54 Text Interpretation:  Sinus tachycardia with irregular rate Left anterior fascicular block Abnormal R-wave progression, late transition Repolarization abnormality, prob rate related Confirmed by Jeneen Rinks  MD, [place] ([id number]) on 04/23/2016 8:16:53 PM      CXR: 1-view chest demonstrated an interval increased left lung base opacity which is suspicious for pneumonia. No pleural effusion noted.  Assessment & Plan by Problem: Active Problems:   Sepsis ([place])  1. Sepsis Likely pulmonary in origin. Patient febrile (100.58F), tachycardic in 110's-120's,  lactic acid of 2.2 and has worsening leukocytosis. Copious amounts of sputum present during examination, pt repeatedly used suction to remove.  2-view chest ordered for further evaluation. CT soft tissue neck ordered to evaluate further for a more superior source of infection as patient has hx of tongue cancer, copious secretions and sore throat. CT neck revealed a thickened epiglottis and hypopharynx. This could be post-treatment related, however there is concern for progression since previous CT scan and reoccurrence. CT neck did not reveal any abscess or drainable fluid collection. Patient started on IV Zosyn and Vancomycin.  Sputum culture also ordered. Blood cultures pending. Repeat lactic acids ordered.  Patient  given 4.5L ns in ED.   2. Decubitus ulcers: Wound care consulted for evaluation of patients pressure ulcers on buttocks, present since last admission. Not full thickness.  3. CLL: Per pts daughter, he follows with Dr. Alvy Bimler (oncology) and there are no plans for treatment at this time. Patients wbc 114 on admission.  4. History of tongue cancer: Patient denies any difficulty swallowing, however patient was unable or unwilling to swallow water and secretions during examination. In the setting of possible aspiration pneumonia, we are asking speech therapy to evaluate the patient. Pt on aspiration precautions and nasotracheal suction available for pt.   5. Recent PE, BL DVT and RV Thrombus: Patient admitted from 6/14-6/23 and was found to have BL PE, BL femoral DVT and RV thrombus found on echo. Patient was discharged on Eliquis.  Patient started on therapeutic Lovenox as patient cannot tolerate PO at this time.   6. HTN Stable at this time, will observe.  Diet: NPO DVT prophylaxis: patient started on Lovenox, pharmacy to dose Code status: Patients daughter, his healthcare POA, was contacted via phone and code status  confirmed.   Dispo: Admit patient to Inpatient with expected length of stay greater than 2 midnights.  SignedEinar Gip, DO 04/23/2016, 11:00 PM  Pager: [phone]

## 2016-04-23 NOTE — ED Triage Notes (Signed)
Patient comes from [facility] facility. Facility reported chest xray yesterday was negative but rhonchi heard throughout and congested cough, facility stated WBC 86.2 yesterday. 135/50 BP, 130-170 HR, resp 30, 18 g RAC in place. Patient c/o bilateral lower extremity pain. Patient following commands. Family/facility want comfort care/treatment of UTI. Patient alert and oriented x 3. Thick green sputum reported. Patient states he fell a couple months ago and left leg has been hurting since. HR 135, BP 114/83 36 resp.

## 2016-04-23 NOTE — ED Notes (Signed)
Patient transported to X-ray 

## 2016-04-24 ENCOUNTER — Encounter (HOSPITAL_COMMUNITY): Payer: Self-pay | Admitting: Internal Medicine

## 2016-04-24 ENCOUNTER — Other Ambulatory Visit (HOSPITAL_COMMUNITY): Payer: Medicare PPO

## 2016-04-24 DIAGNOSIS — I82413 Acute embolism and thrombosis of femoral vein, bilateral: Secondary | ICD-10-CM

## 2016-04-24 DIAGNOSIS — K1379 Other lesions of oral mucosa: Secondary | ICD-10-CM

## 2016-04-24 DIAGNOSIS — J984 Other disorders of lung: Secondary | ICD-10-CM

## 2016-04-24 DIAGNOSIS — Z7901 Long term (current) use of anticoagulants: Secondary | ICD-10-CM

## 2016-04-24 DIAGNOSIS — A419 Sepsis, unspecified organism: Principal | ICD-10-CM

## 2016-04-24 DIAGNOSIS — Z85810 Personal history of malignant neoplasm of tongue: Secondary | ICD-10-CM

## 2016-04-24 DIAGNOSIS — L89329 Pressure ulcer of left buttock, unspecified stage: Secondary | ICD-10-CM

## 2016-04-24 DIAGNOSIS — I2699 Other pulmonary embolism without acute cor pulmonale: Secondary | ICD-10-CM

## 2016-04-24 DIAGNOSIS — J392 Other diseases of pharynx: Secondary | ICD-10-CM

## 2016-04-24 DIAGNOSIS — C9110 Chronic lymphocytic leukemia of B-cell type not having achieved remission: Secondary | ICD-10-CM

## 2016-04-24 DIAGNOSIS — I10 Essential (primary) hypertension: Secondary | ICD-10-CM

## 2016-04-24 LAB — CBC WITH DIFFERENTIAL/PLATELET
BASOS ABS: 0 10*3/uL (ref 0.0–0.1)
BASOS PCT: 0 %
Eosinophils Absolute: 0 10*3/uL (ref 0.0–0.7)
Eosinophils Relative: 0 %
HEMATOCRIT: 47.8 % (ref 39.0–52.0)
HEMOGLOBIN: 15.7 g/dL (ref 13.0–17.0)
LYMPHS PCT: 85 %
Lymphs Abs: 96.9 10*3/uL — ABNORMAL HIGH (ref 0.7–4.0)
MCH: 28.8 pg (ref 26.0–34.0)
MCHC: 32.8 g/dL (ref 30.0–36.0)
MCV: 87.5 fL (ref 78.0–100.0)
MONOS PCT: 3 %
Monocytes Absolute: 3.4 10*3/uL — ABNORMAL HIGH (ref 0.1–1.0)
NEUTROS ABS: 13.7 10*3/uL — AB (ref 1.7–7.7)
Neutrophils Relative %: 12 %
Platelets: 91 10*3/uL — ABNORMAL LOW (ref 150–400)
RBC: 5.46 MIL/uL (ref 4.22–5.81)
RDW: 17.4 % — ABNORMAL HIGH (ref 11.5–15.5)
WBC: 114 10*3/uL (ref 4.0–10.5)

## 2016-04-24 LAB — BASIC METABOLIC PANEL
Anion gap: 9 (ref 5–15)
BUN: 18 mg/dL (ref 6–20)
CHLORIDE: 114 mmol/L — AB (ref 101–111)
CO2: 21 mmol/L — ABNORMAL LOW (ref 22–32)
CREATININE: 0.95 mg/dL (ref 0.61–1.24)
Calcium: 7.9 mg/dL — ABNORMAL LOW (ref 8.9–10.3)
GFR calc Af Amer: >60 mL/min (ref >60)
Glucose, Bld: 122 mg/dL — ABNORMAL HIGH (ref 65–99)
Potassium: 3.5 mmol/L (ref 3.5–5.1)
Sodium: 144 mmol/L (ref 135–145)

## 2016-04-24 LAB — URINE CULTURE: CULTURE: NO GROWTH

## 2016-04-24 LAB — MRSA PCR SCREENING: MRSA by PCR: POSITIVE — AB

## 2016-04-24 LAB — CBC
HCT: 41 % (ref 39.0–52.0)
Hemoglobin: 12.8 g/dL — ABNORMAL LOW (ref 13.0–17.0)
MCH: 27.6 pg (ref 26.0–34.0)
MCHC: 31.2 g/dL (ref 30.0–36.0)
MCV: 88.6 fL (ref 78.0–100.0)
PLATELETS: 75 10*3/uL — AB (ref 150–400)
RBC: 4.63 MIL/uL (ref 4.22–5.81)
RDW: 17.5 % — ABNORMAL HIGH (ref 11.5–15.5)
WBC: 83 10*3/uL (ref 4.0–10.5)

## 2016-04-24 LAB — CK: Total CK: 41 U/L — ABNORMAL LOW (ref 49–397)

## 2016-04-24 LAB — I-STAT CG4 LACTIC ACID, ED: LACTIC ACID, VENOUS: 1.36 mmol/L (ref 0.5–1.9)

## 2016-04-24 MED ORDER — METOPROLOL TARTRATE 5 MG/5ML IV SOLN
5.0000 mg | Freq: Once | INTRAVENOUS | Status: AC
  Administered 2016-04-24: 5 mg via INTRAVENOUS
  Filled 2016-04-24: qty 5

## 2016-04-24 MED ORDER — COLLAGENASE 250 UNIT/GM EX OINT
TOPICAL_OINTMENT | Freq: Every day | CUTANEOUS | Status: DC
  Administered 2016-04-24 – 2016-04-27 (×4): via TOPICAL
  Administered 2016-04-28: 1 via TOPICAL
  Administered 2016-04-29: 10:00:00 via TOPICAL
  Filled 2016-04-24: qty 30

## 2016-04-24 MED ORDER — CHLORHEXIDINE GLUCONATE 0.12 % MT SOLN
15.0000 mL | Freq: Two times a day (BID) | OROMUCOSAL | Status: DC
  Administered 2016-04-24 – 2016-04-29 (×12): 15 mL via OROMUCOSAL
  Filled 2016-04-24 (×8): qty 15

## 2016-04-24 MED ORDER — CETYLPYRIDINIUM CHLORIDE 0.05 % MT LIQD
7.0000 mL | Freq: Two times a day (BID) | OROMUCOSAL | Status: DC
  Administered 2016-04-24 – 2016-04-29 (×11): 7 mL via OROMUCOSAL

## 2016-04-24 MED ORDER — TRIPLE ANTIBIOTIC 3.5-400-5000 EX OINT
1.0000 "application " | TOPICAL_OINTMENT | Freq: Once | CUTANEOUS | Status: DC | PRN
  Filled 2016-04-24: qty 1

## 2016-04-24 MED ORDER — LIDOCAINE HCL 2 % EX GEL
1.0000 "application " | Freq: Once | CUTANEOUS | Status: DC | PRN
  Filled 2016-04-24: qty 5

## 2016-04-24 MED ORDER — SODIUM CHLORIDE 0.9% FLUSH
3.0000 mL | Freq: Two times a day (BID) | INTRAVENOUS | Status: DC
  Administered 2016-04-24 – 2016-04-28 (×8): 3 mL via INTRAVENOUS

## 2016-04-24 MED ORDER — LIDOCAINE-EPINEPHRINE (PF) 1 %-1:200000 IJ SOLN
0.0000 mL | Freq: Once | INTRAMUSCULAR | Status: DC | PRN
  Filled 2016-04-24: qty 30

## 2016-04-24 MED ORDER — SODIUM CHLORIDE 0.9 % IV BOLUS (SEPSIS)
1000.0000 mL | Freq: Once | INTRAVENOUS | Status: AC
  Administered 2016-04-24: 1000 mL via INTRAVENOUS

## 2016-04-24 MED ORDER — CHLORHEXIDINE GLUCONATE CLOTH 2 % EX PADS
6.0000 | MEDICATED_PAD | Freq: Every day | CUTANEOUS | Status: AC
  Administered 2016-04-25 – 2016-04-29 (×5): 6 via TOPICAL

## 2016-04-24 MED ORDER — METOPROLOL TARTRATE 5 MG/5ML IV SOLN
5.0000 mg | Freq: Once | INTRAVENOUS | Status: AC
  Administered 2016-04-24: 5 mg via INTRAVENOUS

## 2016-04-24 MED ORDER — OXYMETAZOLINE HCL 0.05 % NA SOLN
1.0000 | Freq: Once | NASAL | Status: DC | PRN
Start: 2016-04-24 — End: 2016-04-29
  Filled 2016-04-24: qty 15

## 2016-04-24 MED ORDER — LIDOCAINE VISCOUS 2 % MT SOLN
15.0000 mL | Freq: Once | OROMUCOSAL | Status: DC
  Filled 2016-04-24: qty 15

## 2016-04-24 MED ORDER — METOPROLOL TARTRATE 5 MG/5ML IV SOLN
INTRAVENOUS | Status: AC
  Administered 2016-04-24: 5 mg via INTRAVENOUS
  Filled 2016-04-24: qty 5

## 2016-04-24 MED ORDER — MUPIROCIN 2 % EX OINT
1.0000 "application " | TOPICAL_OINTMENT | Freq: Two times a day (BID) | CUTANEOUS | Status: AC
  Administered 2016-04-24 – 2016-04-29 (×10): 1 via NASAL
  Filled 2016-04-24: qty 22

## 2016-04-24 MED ORDER — SILVER NITRATE-POT NITRATE 75-25 % EX MISC
1.0000 | Freq: Once | CUTANEOUS | Status: DC | PRN
  Filled 2016-04-24: qty 1

## 2016-04-24 MED ORDER — LIDOCAINE HCL 4 % EX SOLN
0.0000 mL | Freq: Once | CUTANEOUS | Status: DC | PRN
  Filled 2016-04-24: qty 50

## 2016-04-24 NOTE — Progress Notes (Signed)
Subjective: Dale George continues to endorse a sore throat and painful mouth with productive cough. 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   *talked to Dale George daughter/POA Dale George this afternoon both on the phone and in family meeting. She states that Dale George health truly began to decline 1 year ago. He was able to ambulate with a walker and was more verbal at that time but has since had several falls and serious health issues leading to a decline in ADLs. He has difficulty swallowing but enjoys the pureed diet provided at [facility]. The family has discussed Dale George and his health care wishes extensively, especially in his most recent admission. His daughter states he would most want to be "comfortable."  Objective: Vital signs in last 24 hours: Vitals:   04/24/16 0530 04/24/16 0700 04/24/16 0800 04/24/16 1100  BP: 110/61 102/62 90/60   Pulse: (!) 113 (!) 116 (!) 117   Resp: (!) 26 (!) 30 (!) 29   Temp:   100.1 F (37.8 C) 99.7 F (37.6 C)  TempSrc:   Axillary Axillary  SpO2: 95% 97% 96%   Weight:       Weight change:   Intake/Output Summary (Last 24 hours) at 04/24/16 1448 Last data filed at 04/24/16 1100  Gross per 24 hour  Intake             5000 ml  Output              125 ml  Net             4875 ml   BP 90/60 (BP Location: Right Arm)   Pulse (!) 117   Temp 99.7 F (37.6 C) (Axillary)   Resp (!) 29   Wt 116.1 kg (255 lb 15.3 oz)   SpO2 96%   BMI 29.58 kg/m  General appearance: alert, cooperative, mild distress, moderately obese and ill-appearing Throat: Some bleeding from oral mucosa. Dentures in place. White and yellow ulcerative lesions on tongue and oropharynx. Yellow-green sputum in oral cavity. Neck: Evidence of neck surgery. Firm nodule under L jaw. Full ROM. No thyromegaly. Lungs: Coarse wet breath sounds in all lung  fields. No wheezes. Heart: tachycardic, regular rhythm, S1, S2 normal, no murmur, click, rub or gallop Abdomen: soft, non-tender; bowel sounds normal; no masses,  no organomegaly Extremities: venous stasis bilat below mid-calf. no edema, redness or tenderness in the calves or thighs  Lab Results: Component     Latest Ref Rng & Units 04/24/2016  Sodium     135 - 145 mmol/L 144  Potassium     3.5 - 5.1 mmol/L 3.5  Chloride     101 - 111 mmol/L 114 (H)  CO2     22 - 32 mmol/L 21 (L)  Glucose     65 - 99 mg/dL 122 (H)  BUN     6 - 20 mg/dL 18  Creatinine     0.61 - 1.24 mg/dL 0.95  Calcium     8.9 - 10.3 mg/dL 7.9 (L)  EGFR (Non-African Amer.)     >60 mL/min >60  EGFR (African American)     >60 mL/min >60  Anion gap     5 - 15 9  WBC     4.0 - 10.5 K/uL 83.0 (HH)  RBC     4.22 - 5.81 MIL/uL  4.63  Hemoglobin     13.0 - 17.0 g/dL 12.8 (L)  HCT     39.0 - 52.0 % 41.0  MCV     78.0 - 100.0 fL 88.6  MCH     26.0 - 34.0 pg 27.6  MCHC     30.0 - 36.0 g/dL 31.2  RDW     11.5 - 15.5 % 17.5 (H)  Platelets     150 - 400 K/uL 75 (L)  MRSA by PCR     NEGATIVE POSITIVE (A)   Micro Results: Recent Results (from the past 240 hour(s))  Culture, blood (Routine x 2)     Status: None (Preliminary result)   Collection Time: 04/23/16  7:00 PM  Result Value Ref Range Status   Specimen Description BLOOD RIGHT FOREARM  Final   Special Requests IN PEDIATRIC BOTTLE 2CC  Final   Culture NO GROWTH < 24 HOURS  Final   Report Status PENDING  Incomplete  Urine culture     Status: None   Collection Time: 04/23/16  7:27 PM  Result Value Ref Range Status   Specimen Description URINE, RANDOM  Final   Special Requests NONE  Final   Culture NO GROWTH  Final   Report Status 04/24/2016 FINAL  Final  Culture, blood (Routine x 2)     Status: None (Preliminary result)   Collection Time: 04/23/16  7:58 PM  Result Value Ref Range Status   Specimen Description BLOOD LEFT ANTECUBITAL  Final    Special Requests BOTTLES DRAWN AEROBIC AND ANAEROBIC 10CC  Final   Culture NO GROWTH < 24 HOURS  Final   Report Status PENDING  Incomplete  MRSA PCR Screening     Status: Abnormal   Collection Time: 04/24/16  2:44 AM  Result Value Ref Range Status   MRSA by PCR POSITIVE (A) NEGATIVE Final    Comment:        The GeneXpert MRSA Assay (FDA approved for NASAL specimens only), is one component of a comprehensive MRSA colonization surveillance program. It is not intended to diagnose MRSA infection nor to guide or monitor treatment for MRSA infections. RESULT CALLED TO, READ BACK BY AND VERIFIED WITH: Karolee Stamps RN [id number] 04/24/16 A BROWNING    Studies/Results: Ct Soft Tissue Neck W Contrast  Result Date: 04/24/2016 CLINICAL DATA:  Spitting up purulent sputum. History of tongue cancer, remote radical neck dissection. Assess for abscess or infection. EXAM: CT NECK WITH CONTRAST TECHNIQUE: Multidetector CT imaging of the neck was performed using the standard protocol following the bolus administration of intravenous contrast. CONTRAST:  61m ISOVUE-300 IOPAMIDOL (ISOVUE-300) INJECTION 61% COMPARISON:  CT HEAD and cervical spine March 05, 2016 head CT cervical spine March 29, 2015 FINDINGS: Pharynx and larynx: Thickened epiglottis and area epiglottic folds effacing the piriform sinuses. No discrete mass or fluid collection. Salivary glands: Diminutive RIGHT submandibular gland, a trophic versus resected LEFT submandibular gland. Fatty replaced parotid glands. Thyroid: Normal. Lymph nodes: Status post RIGHT nodal dissection. Sub cm scattered LEFT lymph nodes without lymphadenopathy by CT size criteria. Subcentimeter Vascular: Resected RIGHT internal jugular vein. Calcific atherosclerosis of the carotid bulbs, suspect at least moderate stenosis RIGHT internal carotid artery though not tailored for evaluation. Limited intracranial: Ventriculomegaly, similar to prior CT. No acute process in the head though, limited by  streak artifact from skullbase in dental amalgam. Visualized orbits: Normal. Mastoids and visualized paranasal sinuses: Well aerated. 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 Resected RIGHT sternocleidomastoid muscle. Upper chest: Lung apices are clear. Centrilobular emphysema. No superior mediastinal lymphadenopathy loop though limited by mild respiratory motion. Multiple partially imaged sub cm retro pectoral lymph nodes. IMPRESSION: No abscess or drainable fluid collection.  Patent airway. Thickened epiglottis and hypopharynx, though these may be post treatment related, considering progression from 2016, re- occurrence is a concern. Level 1 lymphadenopathy. Recommend direct inspection and, consider PET-CT as clinically indicated. Electronically Signed   By: Elon Alas M.D.   On: 04/24/2016 00:46   Dg Chest Port 1 View  Result Date: 04/23/2016 CLINICAL DATA:  70 year old male with productive cough for several days. Shortness of breath after IV fluids. Initial encounter. EXAM: PORTABLE CHEST 1 VIEW COMPARISON:  1932 hours today and earlier FINDINGS: Portable AP semi upright view at 2143 hours. Increased left lung base opacity, now mostly obscuring the left hemidiaphragm. Stable lung volumes. No pneumothorax or pulmonary edema. No definite pleural effusion. Stable cardiac size and mediastinal contours. IMPRESSION: Interval increased left lung base opacity suspicious for pneumonia, or possibly aspiration in this setting. No pleural effusion identified. Electronically Signed   By: Genevie Ann M.D.   On: 04/23/2016 21:59   Dg Chest Port 1 View  Result Date: 04/23/2016 CLINICAL DATA:  Cough, sepsis EXAM: PORTABLE CHEST 1 VIEW COMPARISON:  03/12/2016 FINDINGS: Lungs are clear.  No pleural effusion or pneumothorax. The heart is normal in size. IMPRESSION: No evidence of acute cardiopulmonary disease. Electronically Signed   By:  Julian Hy M.D.   On: 04/23/2016 19:47   Medications: Scheduled Meds: . acetaminophen  650 mg Rectal Once  . antiseptic oral rinse  7 mL Mouth Rinse q12n4p  . chlorhexidine  15 mL Mouth Rinse BID  . collagenase   Topical Daily  . enoxaparin (LOVENOX) injection  135 mg Subcutaneous Q12H  . lidocaine  15 mL Mouth/Throat Once  . piperacillin-tazobactam (ZOSYN)  IV  3.375 g Intravenous Q8H  . sodium chloride flush  3 mL Intravenous Q12H  . vancomycin  1,250 mg Intravenous Q12H   Continuous Infusions:  PRN Meds:.acetaminophen, lidocaine, lidocaine, lidocaine-EPINEPHrine, oxymetazoline, silver nitrate applicators, TRIPLE ANTIBIOTIC   Assessment/Plan: Active Problems:   Sepsis (HCC) Dysphagia Ulcerative lesions on tongue and oropharynx Decubitus Ulcers CLL Recent PE, BL DVT, RV Thrombus HTN  Sepsis 2/2 possible aspiration pneumonia: Patient met SIRS criteria upon admission and continues to be tachycardic and tachypneic. Given signs of infection, dysphagia, coarse breath sounds in lungs, and CXR findings, Mr. Jungwirth likely has aspiration pneumonia. He is currently receiving iv zosyn and vancomycin for broad spectrum coverage. Speech evaluated patient earlier today and determined him to be high aspiration risk. However, in discussion with patient's daughter who is POA and believes her father would not want a PEG tube or other feeding devices, we will continue the pureed diet he was receiving at [facility].   --Continue iv zosyn and vancomycin. Discuss extent of antibiotic therapy with daughter this afternoon given DNR status.  --Blood and sputum cultures pending  --Confirmed treatment wishes with Mr. Conde daughter--she would like to base health care decisions around comfort at this time. Will continue antibiotics and fluids (if necessary) for now. If Mr. Goren shows improvement and is able to be discharged, he will return to [place] and [place] Dale George will revisit MOST form. If Mr.  [place] does not improve, will discuss options with daughter and will likely discontinue antibiotics and transfer to hospice care. Patient has completed MOST form in his chart.  Dysphagia: likely contributing factor in sepsis. Patient's daughter believes dysphagia is related to both worsening dementia and tongue cancer resection. Speech evaluated today and believes patient is high risk for aspiration. However, given DNR status and patient wishes to continue eating, we will start puree diet.  Ulcerative lesions on tongue and oropharynx: Unclear origin. Consider traumatic (suction tube-related) vs. Tongue cancer recurrence vs. Infectious. Discussed with ENT who agrees management should be palliative.   Decubitus ulcers: Wound care consulted. Not full thickness.  CLL: managed by Dr. Alvy Bimler. No further plans for treatment.  Recent PE, BL DVT, RV thrombus: Discuss appropriate anticoagulation with Dr. Alvy Bimler given patient's history of thromboembolism 2/2 malignancy. Continue lovenox for now.  HTN: hold home meds in setting of sepsis.  FEN/GI: Will give puree diet.  DVT prophylaxis: Continue lovenox.  Code Status: DNR, confirmed with daughter who is POA.   This is a Careers information officer Note.  The care of the patient was discussed with Dr. Posey Pronto and the assessment and plan formulated with their assistance.  Please see their attached note for official documentation of the daily encounter.   LOS: 1 day   Pershing Cox, Medical Student 04/24/2016, 2:48 PM

## 2016-04-24 NOTE — Evaluation (Signed)
Clinical/Bedside Swallow Evaluation Patient Details  Name: Dale George MRN: [id number] Date of Birth: 04/18/1946  Today's Date: 04/24/2016 Time: SLP Start Time (ACUTE ONLY): 25 SLP Stop Time (ACUTE ONLY): 1141 SLP Time Calculation (min) (ACUTE ONLY): 11 min  Past Medical History:  Past Medical History:  Diagnosis Date  . Ataxia   . Cancer (HCC)    tongue cancer  . Chronic kidney disease    Stage 3  . CLL (chronic lymphocytic leukemia) ([place]) 12/23/2013  . Dementia   . Fatigue 12/14/2013  . Gout   . Hyperlipidemia   . Hypertension   . Insomnia   . Lymphocytosis 12/14/2013   Past Surgical History:  Past Surgical History:  Procedure Laterality Date  . LYMPH NODE DISSECTION     HPI:  70 year old male with MHx significant for dementia, CLL under observation, remote hx of tongue cancer (s/p radical neck surgery, XRT), CHF, HTN and recent bilateral PE with BL femoral DVT, left UE DVT and RV thrombus who presents with sepsis from pulmonary origin. CT soft tissue neck shows thickened epiglottis and hypopharynx which may be post-tx related but is also concerning for reoccurrence given progression since previous imaging. Pt had a recent MBS 03/14/16 with severe oropharyngeal dysphagia with nasopharyngeal reflux, severe residuals, silent aspiration, and very little ability to functionally consueme POs. Per SLP and MD notes from that admission, pt's daughter had opted to accept the risks of aspiration and allow PO intake (regular textures, thin liquids).    Assessment / Plan / Recommendation Clinical Impression  Pt's oral cavity is dry with signs of active bleeding on his tongue, as well as dried secretions and blood throughout his mouth and lips. His voice is wet at baseline, concerning for poor secretion management. FEES completed during recent admission (June 2017) showed pooling secretions in his pharynx. He is unable to produce a strong enough cough to adequately clear his throat. Few ice  chips provided in an attempt to thin secretions for better mobility with minimal oral expectoration. Wetness seemed to increase and minimal hyolaryngeal movement was noted upon palpation. Pt has limited ROM of his tongue.   Given the above as well as the severity of his likely chronic dysphagia documented during MBS 03/14/16, no further testing is indicated at this time as pt is certain to be a chronic, high aspiration risk. At the time of his most recent study, this was discussed with his daughter and the decision was made to proceed with regular diet and thin liquids, accepting the risks for aspiration in order to provide comfort feeds. Recommendation from MD at the time was for palliative care at SNF to f/u with pt, although it is unclear if this has been done. At this time, SLP highly recommends palliative care consult while in house to discuss [place] with pt and family. Would maintain NPO status with focus on aggressive oral care until it can be determined what pt/family wishes are.    Aspiration Risk  Severe aspiration risk;Risk for inadequate nutrition/hydration    Diet Recommendation NPO   Medication Administration: Via alternative means    Other  Recommendations Oral Care Recommendations: Oral care QID Other Recommendations: Have oral suction available   Follow up Recommendations   (tba)    Frequency and Duration min 2x/week  1 week       Prognosis Prognosis for Safe Diet Advancement: Guarded Barriers to Reach Goals: Time post onset;Severity of deficits      Swallow Study   General  HPI: 70 year old male with MHx significant for dementia, CLL under observation, remote hx of tongue cancer (s/p radical neck surgery, XRT), CHF, HTN and recent bilateral PE with BL femoral DVT, left UE DVT and RV thrombus who presents with sepsis from pulmonary origin. CT soft tissue neck shows thickened epiglottis and hypopharynx which may be post-tx related but is also concerning for reoccurrence given  progression since previous imaging. Pt had a recent MBS 03/14/16 with severe oropharyngeal dysphagia with nasopharyngeal reflux, severe residuals, silent aspiration, and very little ability to functionally consueme POs. Per SLP and MD notes from that admission, pt's daughter had opted to accept the risks of aspiration and allow PO intake (regular textures, thin liquids).  Type of Study: Bedside Swallow Evaluation Previous Swallow Assessment: see HPI Diet Prior to this Study: NPO Temperature Spikes Noted: Yes (100.5) Respiratory Status: Nasal cannula History of Recent Intubation: No Behavior/Cognition: Alert;Cooperative;Confused Oral Cavity Assessment: Dry;Dried secretions;Other (comment) (bleeding from tongue, dried blood on lips) Oral Cavity - Dentition: Poor condition Patient Positioning: Upright in bed Baseline Vocal Quality: Wet Volitional Cough: Weak;Congested    Oral/Motor/Sensory Function Overall Oral Motor/Sensory Function: Other (comment) (poor ROM of tongue)   Ice Chips Ice chips: Impaired Presentation: Spoon Pharyngeal Phase Impairments: Suspected delayed Swallow;Decreased hyoid-laryngeal movement;Wet Vocal Quality;Cough - Delayed   Thin Liquid Thin Liquid: Not tested    Nectar Thick Nectar Thick Liquid: Not tested   Honey Thick Honey Thick Liquid: Not tested   Puree Puree: Not tested   Solid   GO   Solid: Not tested        Germain Osgood 04/24/2016,12:00 PM  Germain Osgood, M.A. CCC-SLP [phone]

## 2016-04-24 NOTE — Progress Notes (Signed)
   Subjective: This morning, he was able to talk though we had difficulty understanding everything completely. He confirms he has had problems with coughing and was complaining about blood in his mouth.   Later this afternoon, we spoke with the patient's daughter, Dale George, who is the HCPOA. He has had a decline in his health over the last year.   Objective:  Vital signs in last 24 hours: Vitals:   04/24/16 0530 04/24/16 0700 04/24/16 0800 04/24/16 1100  BP: 110/61 102/62 90/60   Pulse: (!) 113 (!) 116 (!) 117   Resp: (!) 26 (!) 30 (!) 29   Temp:   100.1 F (37.8 C) 99.7 F (37.6 C)  TempSrc:   Axillary Axillary  SpO2: 95% 97% 96%   Weight:       General: elderly Caucasian male, resting in bed HEENT: posterior oropharynx with bloody lesion Cardiac: RRR, no rubs, murmurs or gallops Pulm: coarse breath sounds in the anterior lung fields without wheezing Abd: soft, nontender, nondistended, BS present Ext: chronic venous stasis changes Neuro: responds to questions appropriately; moving all extremities freely  Assessment/Plan:  Dale George is a 70 year old male with CLL, dementia, left buttock decubitus ulcer, h/o DVT/PE, h/o tongue cancer hospitalized for presumed sepsis 2/2 aspiration pneumonia found to have ulcerative lesions of the posterior oropharynx.  Sepsis 2/2 aspiration pneumonia: Started on IV Zosyn and vancomycin for broad-spectrum coverage though could be narrowed pending clinical improvement. Discussions with daughter revealed she would a time-limited trial of antibiotics and fluids for at 24 to 48 hour intervals given priority placed on keeping patient comfortable. -Continue IV vancomycin and Zosyn [Abx Day 2] -Follow-up blood cultures -Give IV fluids for hypotension [SBP<90] -If he does not show signs of clinical improvement, we will transition to full comfort care  Dysphagia: Likely multifactorial in the setting of his dementia and h/o tongue cancer. Posterior oropharynx  lesions concerning especially given CT imaging findings suggestive of recurrent malignancy. -Consult ENT for further evaluation -Continue Soft Diet  Decubitus ulcers: Wound Care following.  CLL: Baseline leukocytosis trending 80-90 though 114 on admission.  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 -Continue Lovenox for now  #FEN:  -Diet: Soft  #DVT prophylaxis: Lovenox  #CODE STATUS: DNR/DNI -Defer to daughter Dale George if patients lacks decision-making capacity -Confirmed with patient on admission   Dispo: Anticipated discharge in approximately 2-3 day(s).   LOS: 1 day   Dale Dubin, MD 04/24/2016, 2:45 PM Pager: @[phone]

## 2016-04-24 NOTE — Consult Note (Signed)
Reason for Consult: Dysphagia Referring Physician: Bartholomew Crews, MD  Dale George is an 70 y.o. male.  HPI: Admitted yesterday with severe aspiration pneumonia. History of tongue cancer treated in [place] about 15 years ago with partial glossectomy and neck dissection with postoperative radiation. He has been free of disease ever since. He is followed by Dr. Alvy Bimler with CLL, currently not being treated. His daughter is here in the room today. She sees infrequently. He has been having some change in voice recently. He started having difficulty swallowing only in the last few days. He denies any aspiration of liquids.  Past Medical History:  Diagnosis Date  . Ataxia   . Cancer (HCC)    tongue cancer  . Chronic kidney disease    Stage 3  . CLL (chronic lymphocytic leukemia) ([place]) 12/23/2013  . Dementia   . Fatigue 12/14/2013  . Gout   . Hyperlipidemia   . Hypertension   . Insomnia   . Lymphocytosis 12/14/2013    Past Surgical History:  Procedure Laterality Date  . LYMPH NODE DISSECTION      Family History  Problem Relation Age of Onset  . Tuberculosis Mother     Social History:  reports that he quit smoking about 34 years ago. He has a 20.00 pack-year smoking history. He has never used smokeless tobacco. He reports that he drinks about 1.2 oz of alcohol per week . He reports that he does not use drugs.  Allergies: No Known Allergies  Medications: Reviewed  Results for orders placed or performed during the hospital encounter of 04/23/16 (from the past 48 hour(s))  Comprehensive metabolic panel     Status: Abnormal   Collection Time: 04/23/16  7:00 PM  Result Value Ref Range   Sodium 137 135 - 145 mmol/L   Potassium 6.4 (HH) 3.5 - 5.1 mmol/L    Comment: REPEATED TO VERIFY SLIGHT HEMOLYSIS CRITICAL RESULT CALLED TO, READ BACK BY AND VERIFIED WITH: D HENSON,RN 2003 04/23/2016 WBOND    Chloride 106 101 - 111 mmol/L   CO2 21 (L) 22 - 32 mmol/L   Glucose, Bld 135 (H)  65 - 99 mg/dL   BUN 20 6 - 20 mg/dL   Creatinine, Ser 1.09 0.61 - 1.24 mg/dL   Calcium 8.9 8.9 - 10.3 mg/dL   Total Protein 6.7 6.5 - 8.1 g/dL   Albumin 3.4 (L) 3.5 - 5.0 g/dL   AST 32 15 - 41 U/L   ALT 21 17 - 63 U/L   Alkaline Phosphatase 89 38 - 126 U/L   Total Bilirubin 1.2 0.3 - 1.2 mg/dL   GFR calc non Af Amer >60 >60 mL/min   GFR calc Af Amer >60 >60 mL/min    Comment: (NOTE) The eGFR has been calculated using the CKD EPI equation. This calculation has not been validated in all clinical situations. eGFR's persistently <60 mL/min signify possible Chronic Kidney Disease.    Anion gap 10 5 - 15  CBC with Differential     Status: Abnormal   Collection Time: 04/23/16  7:00 PM  Result Value Ref Range   WBC 114.0 (HH) 4.0 - 10.5 K/uL    Comment: REPEATED TO VERIFY CRITICAL RESULT CALLED TO, READ BACK BY AND VERIFIED WITH: DTilden Dome RN [phone] 2006 GREEN R CORRECT CALL DATE: 04/23/2016    RBC 5.46 4.22 - 5.81 MIL/uL   Hemoglobin 15.7 13.0 - 17.0 g/dL   HCT 47.8 39.0 - 52.0 %   MCV 87.5 78.0 -  100.0 fL   MCH 28.8 26.0 - 34.0 pg   MCHC 32.8 30.0 - 36.0 g/dL   RDW 17.4 (H) 11.5 - 15.5 %   Platelets 91 (L) 150 - 400 K/uL    Comment: PLATELET COUNT CONFIRMED BY SMEAR   Neutrophils Relative % 12 %   Lymphocytes Relative 85 %   Monocytes Relative 3 %   Eosinophils Relative 0 %   Basophils Relative 0 %   Neutro Abs 13.7 (H) 1.7 - 7.7 K/uL   Lymphs Abs 96.9 (H) 0.7 - 4.0 K/uL   Monocytes Absolute 3.4 (H) 0.1 - 1.0 K/uL   Eosinophils Absolute 0.0 0.0 - 0.7 K/uL   Basophils Absolute 0.0 0.0 - 0.1 K/uL   WBC Morphology ATYPICAL LYMPHOCYTES   Culture, blood (Routine x 2)     Status: None (Preliminary result)   Collection Time: 04/23/16  7:00 PM  Result Value Ref Range   Specimen Description BLOOD RIGHT FOREARM    Special Requests IN PEDIATRIC BOTTLE 2CC    Culture NO GROWTH < 24 HOURS    Report Status PENDING   I-Stat CG4 Lactic Acid, ED     Status: Abnormal   Collection  Time: 04/23/16  7:24 PM  Result Value Ref Range   Lactic Acid, Venous 2.22 (HH) 0.5 - 1.9 mmol/L   Comment NOTIFIED PHYSICIAN   Urine culture     Status: None   Collection Time: 04/23/16  7:27 PM  Result Value Ref Range   Specimen Description URINE, RANDOM    Special Requests NONE    Culture NO GROWTH    Report Status 04/24/2016 FINAL   Urinalysis, Routine w reflex microscopic     Status: Abnormal   Collection Time: 04/23/16  7:27 PM  Result Value Ref Range   Color, Urine ORANGE (A) YELLOW    Comment: BIOCHEMICALS MAY BE AFFECTED BY COLOR   APPearance CLOUDY (A) CLEAR   Specific Gravity, Urine 1.030 1.005 - 1.030   pH 5.5 5.0 - 8.0   Glucose, UA NEGATIVE NEGATIVE mg/dL   Hgb urine dipstick LARGE (A) NEGATIVE   Bilirubin Urine MODERATE (A) NEGATIVE   Ketones, ur 15 (A) NEGATIVE mg/dL   Protein, ur 30 (A) NEGATIVE mg/dL   Nitrite POSITIVE (A) NEGATIVE   Leukocytes, UA SMALL (A) NEGATIVE  Urine microscopic-add on     Status: Abnormal   Collection Time: 04/23/16  7:27 PM  Result Value Ref Range   Squamous Epithelial / LPF 0-5 (A) NONE SEEN   WBC, UA 0-5 0 - 5 WBC/hpf   RBC / HPF TOO NUMEROUS TO COUNT 0 - 5 RBC/hpf   Bacteria, UA MANY (A) NONE SEEN  I-Stat beta hCG blood, ED     Status: None   Collection Time: 04/23/16  7:32 PM  Result Value Ref Range   I-stat hCG, quantitative <5.0 <5 mIU/mL   Comment 3            Comment:   GEST. AGE      CONC.  (mIU/mL)   <=1 WEEK        5 - 50     2 WEEKS       50 - 500     3 WEEKS       100 - 10,000     4 WEEKS     1,000 - 30,000        MALE AND NON-PREGNANT MALE:     LESS THAN 5 mIU/mL   Culture, blood (  Routine x 2)     Status: None (Preliminary result)   Collection Time: 04/23/16  7:58 PM  Result Value Ref Range   Specimen Description BLOOD LEFT ANTECUBITAL    Special Requests BOTTLES DRAWN AEROBIC AND ANAEROBIC 10CC    Culture NO GROWTH < 24 HOURS    Report Status PENDING   Basic metabolic panel     Status: Abnormal    Collection Time: 04/23/16  9:24 PM  Result Value Ref Range   Sodium 140 135 - 145 mmol/L   Potassium 5.2 (H) 3.5 - 5.1 mmol/L   Chloride 109 101 - 111 mmol/L   CO2 23 22 - 32 mmol/L   Glucose, Bld 120 (H) 65 - 99 mg/dL   BUN 21 (H) 6 - 20 mg/dL   Creatinine, Ser 1.04 0.61 - 1.24 mg/dL   Calcium 8.5 (L) 8.9 - 10.3 mg/dL   GFR calc non Af Amer >60 >60 mL/min   GFR calc Af Amer >60 >60 mL/min    Comment: (NOTE) The eGFR has been calculated using the CKD EPI equation. This calculation has not been validated in all clinical situations. eGFR's persistently <60 mL/min signify possible Chronic Kidney Disease.    Anion gap 8 5 - 15  CK     Status: Abnormal   Collection Time: 04/23/16 11:48 PM  Result Value Ref Range   Total CK 41 (L) 49 - 397 U/L  I-Stat CG4 Lactic Acid, ED  (not at  [facility])     Status: None   Collection Time: 04/23/16 11:54 PM  Result Value Ref Range   Lactic Acid, Venous 1.36 0.5 - 1.9 mmol/L  MRSA PCR Screening     Status: Abnormal   Collection Time: 04/24/16  2:44 AM  Result Value Ref Range   MRSA by PCR POSITIVE (A) NEGATIVE    Comment:        The GeneXpert MRSA Assay (FDA approved for NASAL specimens only), is one component of a comprehensive MRSA colonization surveillance program. It is not intended to diagnose MRSA infection nor to guide or monitor treatment for MRSA infections. RESULT CALLED TO, READ BACK BY AND VERIFIED WITH: Karolee Stamps RN [id number] 04/24/16 A BROWNING   CBC     Status: Abnormal   Collection Time: 04/24/16  5:06 AM  Result Value Ref Range   WBC 83.0 (HH) 4.0 - 10.5 K/uL    Comment: REPEATED TO VERIFY CRITICAL VALUE NOTED.  VALUE IS CONSISTENT WITH PREVIOUSLY REPORTED AND CALLED VALUE.    RBC 4.63 4.22 - 5.81 MIL/uL   Hemoglobin 12.8 (L) 13.0 - 17.0 g/dL   HCT 41.0 39.0 - 52.0 %   MCV 88.6 78.0 - 100.0 fL   MCH 27.6 26.0 - 34.0 pg   MCHC 31.2 30.0 - 36.0 g/dL   RDW 17.5 (H) 11.5 - 15.5 %   Platelets 75 (L) 150 - 400 K/uL    Comment:  CONSISTENT WITH PREVIOUS RESULT  Basic metabolic panel     Status: Abnormal   Collection Time: 04/24/16  9:27 AM  Result Value Ref Range   Sodium 144 135 - 145 mmol/L   Potassium 3.5 3.5 - 5.1 mmol/L   Chloride 114 (H) 101 - 111 mmol/L   CO2 21 (L) 22 - 32 mmol/L   Glucose, Bld 122 (H) 65 - 99 mg/dL   BUN 18 6 - 20 mg/dL   Creatinine, Ser 0.95 0.61 - 1.24 mg/dL   Calcium 7.9 (L) 8.9 - 10.3 mg/dL  GFR calc non Af Amer >60 >60 mL/min   GFR calc Af Amer >60 >60 mL/min    Comment: (NOTE) The eGFR has been calculated using the CKD EPI equation. This calculation has not been validated in all clinical situations. eGFR's persistently <60 mL/min signify possible Chronic Kidney Disease.    Anion gap 9 5 - 15    Ct Soft Tissue Neck W Contrast  Result Date: 04/24/2016 CLINICAL DATA:  Spitting up purulent sputum. History of tongue cancer, remote radical neck dissection. Assess for abscess or infection. EXAM: CT NECK WITH CONTRAST TECHNIQUE: Multidetector CT imaging of the neck was performed using the standard protocol following the bolus administration of intravenous contrast. CONTRAST:  47m ISOVUE-300 IOPAMIDOL (ISOVUE-300) INJECTION 61% COMPARISON:  CT HEAD and cervical spine March 05, 2016 head CT cervical spine March 29, 2015 FINDINGS: Pharynx and larynx: Thickened epiglottis and area epiglottic folds effacing the piriform sinuses. No discrete mass or fluid collection. Salivary glands: Diminutive RIGHT submandibular gland, a trophic versus resected LEFT submandibular gland. Fatty replaced parotid glands. Thyroid: Normal. Lymph nodes: Status post RIGHT nodal dissection. Sub cm scattered LEFT lymph nodes without lymphadenopathy by CT size criteria. Subcentimeter Vascular: Resected RIGHT internal jugular vein. Calcific atherosclerosis of the carotid bulbs, suspect at least moderate stenosis RIGHT internal carotid artery though not tailored for evaluation. Limited intracranial: Ventriculomegaly, similar to  prior CT. No acute process in the head though, limited by streak artifact from skullbase in dental amalgam. Visualized orbits: Normal. Mastoids and visualized paranasal sinuses: Well aerated. 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 Resected RIGHT sternocleidomastoid muscle. Upper chest: Lung apices are clear. Centrilobular emphysema. No superior mediastinal lymphadenopathy loop though limited by mild respiratory motion. Multiple partially imaged sub cm retro pectoral lymph nodes. IMPRESSION: No abscess or drainable fluid collection.  Patent airway. Thickened epiglottis and hypopharynx, though these may be post treatment related, considering progression from 2016, re- occurrence is a concern. Level 1 lymphadenopathy. Recommend direct inspection and, consider PET-CT as clinically indicated. Electronically Signed   By: CElon AlasM.D.   On: 04/24/2016 00:46   Dg Chest Port 1 View  Result Date: 04/23/2016 CLINICAL DATA:  70year old male with productive cough for several days. Shortness of breath after IV fluids. Initial encounter. EXAM: PORTABLE CHEST 1 VIEW COMPARISON:  1932 hours today and earlier FINDINGS: Portable AP semi upright view at 2143 hours. Increased left lung base opacity, now mostly obscuring the left hemidiaphragm. Stable lung volumes. No pneumothorax or pulmonary edema. No definite pleural effusion. Stable cardiac size and mediastinal contours. IMPRESSION: Interval increased left lung base opacity suspicious for pneumonia, or possibly aspiration in this setting. No pleural effusion identified. Electronically Signed   By: HGenevie AnnM.D.   On: 04/23/2016 21:59   Dg Chest Port 1 View  Result Date: 04/23/2016 CLINICAL DATA:  Cough, sepsis EXAM: PORTABLE CHEST 1 VIEW COMPARISON:  03/12/2016 FINDINGS: Lungs are clear.  No pleural effusion or pneumothorax. The heart is normal in size. IMPRESSION: No evidence of acute  cardiopulmonary disease. Electronically Signed   By: SJulian HyM.D.   On: 04/23/2016 19:47    RWCB:[id number] as listed in admit H&P  Blood pressure 90/60, pulse (!) 117, temperature 99.7 F (37.6 C), temperature source Axillary, resp. rate (!) 29, weight 116.1 kg (255 lb 15.3 oz), SpO2 96 %.  PHYSICAL EXAM: Overall appearance:  Tachypneic with very wet breath sounds. Head:  Normocephalic, atraumatic. Ears: External  ears look healthy. Nose: External nose is healthy in appearance. Internal nasal exam free of any lesions or obstruction. Oral Cavity/Pharynx:  Upper and lower dentures, dried secretions adhering to very dry mucosa. No specific mass identified. Larynx/Hypopharynx: On fiberoptic examination, the true vocal cords are intact and normally mobile. There are no obvious tumors identified in the glottic larynx, subglottic larynx or supraglottic larynx. The hypopharynx is filled with purulent secretions that are pooling in the form sinuses and the vallecula. Neck: No palpable neck masses. Findings consistent with previous right neck dissection  Studies Reviewed: Neck CT  Procedures: Flexible fiberoptic examination of the larynx.  Topical Xylocaine viscus was applied to the nasal cavities. The scope was passed down the left nasal cavity. The nasopharynx is clear. The hypopharynx and larynx were examined and the findings are described above. He tolerated this well. The scope was withdrawn.   Assessment/Plan: Aspiration pneumonia. Significant pooling of purulent secretions in the hypopharynx. No signs of neoplastic disease. Severe dryness of the oral cavity. He is a significant continued aspiration risk. Recommend he remain nothing by mouth. We discussed the possible utility of a feeding tube, and also a tracheostomy to assist with pulmonary toilet. At this point, he is considering palliative care. I think this is reasonable. Contact me if anything changes.  Monica Zahler,  Adriana Quinby 04/24/2016, 5:10 PM

## 2016-04-24 NOTE — Clinical Social Work Note (Signed)
Patient's daughter not room. CSW called and left voicemail for her for to discuss discharge planning.   Dayton Scrape, [place]

## 2016-04-24 NOTE — Progress Notes (Signed)
[place] Nurse wound consult note Reason for Consult:Pressure injury to buttocks Wound type:Unstageable pressure injury to apex of left buttocks Pressure Ulcer POA: Yes Measurement: 4 cm x 4 cm x 0.2 cm Wound bed: Dark tissue Drainage (amount, consistency, odor) moderate amount of serosanguinous drainage Periwound: Red but blanchable Dressing procedure/placement/frequency: Cleanse wound to left buttocks with normal saline, pat dry. Apply dime size amount of Santyl Ointment to wound bed. Cover with Allevyn border foam dressing 5 x 5. Change dressing daily.   Re consult if needed, will not follow at this time. Thanks, Melba Coon MSN, RN, [employer]

## 2016-04-24 NOTE — Progress Notes (Signed)
Pt refuse NTS. Agricultural consultant and primary RN aware.

## 2016-04-24 NOTE — Clinical Social Work Note (Signed)
Clinical Social Work Assessment  Patient Details  Name: Dale George MRN: [id number] Date of Birth: Sep 17, 1946  Date of referral:  04/24/16               Reason for consult:  Discharge Planning                Permission sought to share information with:  Facility Sport and exercise psychologist, Family Supports Permission granted to share information::  Yes, Verbal Permission Granted  Name::     Kurtis Bushman  Agency::  [facility]  Relationship::  Daughter/POA  Contact Information:  [phone]  Housing/Transportation Living arrangements for the past 2 months:  [place] of Information:  Medical Team, [place], Adult Children Patient Interpreter Needed:  None Criminal Activity/Legal Involvement Pertinent to Current Situation/Hospitalization:  No - Comment as needed Significant Relationships:  Adult Children Lives with:  Facility Resident Do you feel safe going back to the place where you live?  Yes Need for family participation in patient care:  Yes (Comment)  Care giving concerns:  Patient is from [facility].   Social Worker assessment / plan:  Patient oriented only to self. CSW called patient's daughter and left voicemail. Daughter quickly called back. CSW introduced role and explained that discharge planning would be discussed. Patient's daughter confirmed that he is from [facility] and the plan is to return. Patient needs PT eval before CSW can start insurance authorization to return. Anticipated discharge date is Saturday 8/5. Per Deirdre Pippins, admissions coordinator at [facility], patient has used his Medicare days but CSW can attempt insurance authorization. Patient's daughter applied for Medicaid on June 26 and she stated that she should have an answer within 15 days. She needs to turn in some documents. No further concerns. CSW encouraged patient's daughter to contact CSW as needed. CSW will continue to follow patient and his daughter for  support and facilitate discharge back to SNF once medically stable.  Employment status:  Retired Nurse, adult PT Recommendations:  Not assessed at this time Information / Referral to community resources:  Other (Comment Required) (Plan is to return to [facility])  Patient/Family's Response to care:  Patient not fully oriented. Patient's daughter agreeable to return to [facility]. Patient's daughter supportive and involved in patient's care. Patient's daughter appreciated social work intervention.  Patient/Family's Understanding of and Emotional Response to Diagnosis, Current Treatment, and Prognosis:  Patient not fully oriented. Patient's daughter understands need for return to SNF following discharge. Patient's daughter appears happy with hospital care.  Emotional Assessment Appearance:  Appears stated age Attitude/Demeanor/Rapport:  Unable to Assess Affect (typically observed):  Unable to Assess Orientation:  Oriented to Self Alcohol / Substance use:  Never Used Psych involvement (Current and /or in the community):  No (Comment)  Discharge Needs  Concerns to be addressed:  Care Coordination Readmission within the last 30 days:  No Current discharge risk:  Cognitively Impaired, Dependent with Mobility Barriers to Discharge:  Lee [place], LCSW 04/24/2016, 11:26 AM

## 2016-04-24 NOTE — ED Notes (Signed)
Attempted Report 

## 2016-04-25 DIAGNOSIS — Z86718 Personal history of other venous thrombosis and embolism: Secondary | ICD-10-CM

## 2016-04-25 DIAGNOSIS — F0390 Unspecified dementia without behavioral disturbance: Secondary | ICD-10-CM

## 2016-04-25 DIAGNOSIS — R1310 Dysphagia, unspecified: Secondary | ICD-10-CM

## 2016-04-25 DIAGNOSIS — J690 Pneumonitis due to inhalation of food and vomit: Secondary | ICD-10-CM

## 2016-04-25 DIAGNOSIS — Z86711 Personal history of pulmonary embolism: Secondary | ICD-10-CM

## 2016-04-25 LAB — CBC
HCT: 39.3 % (ref 39.0–52.0)
Hemoglobin: 12.3 g/dL — ABNORMAL LOW (ref 13.0–17.0)
MCH: 28.1 pg (ref 26.0–34.0)
MCHC: 31.3 g/dL (ref 30.0–36.0)
MCV: 89.7 fL (ref 78.0–100.0)
PLATELETS: 76 10*3/uL — AB (ref 150–400)
RBC: 4.38 MIL/uL (ref 4.22–5.81)
RDW: 17.6 % — AB (ref 11.5–15.5)
WBC: 75.2 10*3/uL (ref 4.0–10.5)

## 2016-04-25 LAB — BASIC METABOLIC PANEL
Anion gap: 8 (ref 5–15)
BUN: 16 mg/dL (ref 6–20)
CO2: 21 mmol/L — ABNORMAL LOW (ref 22–32)
CREATININE: 0.89 mg/dL (ref 0.61–1.24)
Calcium: 8.2 mg/dL — ABNORMAL LOW (ref 8.9–10.3)
Chloride: 115 mmol/L — ABNORMAL HIGH (ref 101–111)
GFR calc Af Amer: >60 mL/min (ref >60)
Glucose, Bld: 103 mg/dL — ABNORMAL HIGH (ref 65–99)
Potassium: 3.6 mmol/L (ref 3.5–5.1)
Sodium: 144 mmol/L (ref 135–145)

## 2016-04-25 MED ORDER — ENOXAPARIN SODIUM 120 MG/0.8ML ~~LOC~~ SOLN
1.0000 mg/kg | Freq: Two times a day (BID) | SUBCUTANEOUS | Status: DC
  Administered 2016-04-25 – 2016-04-26 (×2): 115 mg via SUBCUTANEOUS
  Filled 2016-04-25 (×4): qty 0.77

## 2016-04-25 MED ORDER — VANCOMYCIN HCL IN DEXTROSE 1-5 GM/200ML-% IV SOLN
1000.0000 mg | Freq: Two times a day (BID) | INTRAVENOUS | Status: DC
  Administered 2016-04-25 (×2): 1000 mg via INTRAVENOUS
  Filled 2016-04-25 (×4): qty 200

## 2016-04-25 NOTE — Progress Notes (Signed)
   Subjective: This morning, he appeared better than yesterday. He denies any pain, discomfort, improved appetite, worsening cough. We reviewed with him our conversation with his daughter yesterday, and he agreed that comfort should be the focus of his care. We explained that if he continued to improve, he will require IV access for his antibiotics to which he agreed. Of course, we explained we would revisit this if things were to decline.    Objective:  Vital signs in last 24 hours: Vitals:   04/25/16 0430 04/25/16 0500 04/25/16 0730 04/25/16 1100  BP: 124/78  118/83 122/88  Pulse: (!) 104  (!) 101 (!) 114  Resp: (!) 24  (!) 25 (!) 26  Temp: 97.8 F (36.6 C)  97.6 F (36.4 C) 97.4 F (36.3 C)  TempSrc: Axillary  Oral Oral  SpO2: 96%  94% 93%  Weight:  255 lb 15.3 oz (116.1 kg)     General: elderly Caucasian male, resting in bed HEENT: dry mucous membranes, no blood in the perioral area Cardiac: irregular rate and rhythm, no rubs, murmurs or gallops Pulm: coarse breath sounds in the anterior lung fields without wheezing Abd: soft, nontender, nondistended, BS present Ext: chronic venous stasis changes in bilateral LE Neuro: responds to questions appropriately; moving all extremities freely, oriented to name and place but not year  Assessment/Plan:  Mr. Las is a 70 year old male with CLL, dementia, left buttock decubitus ulcer, h/o DVT/PE, h/o tongue cancer hospitalized for presumed sepsis 2/2 aspiration pneumonia found to have ulcerative lesions of the posterior oropharynx.  Sepsis 2/2 aspiration pneumonia: Started on IV Zosyn and vancomycin for broad-spectrum coverage though narrowing would be limited by the fact that he is MRSA+. Discussions with daughter revealed she would a time-limited trial of antibiotics and fluids for at 24 to 48 hour intervals given priority placed on keeping patient comfortable. -Continue IV vancomycin and Zosyn [Abx Day 3] -Follow-up blood cultures/urine  culture -Give IV fluids for hypotension [SBP<90] -If he does not show signs of clinical improvement, we will transition to full comfort care  Dysphagia: Likely multifactorial in the setting of his dementia and h/o tongue cancer. ENT evaluation yesterday reassuring for no signs of malignant recurrence.  -Continue Soft Diet  Decubitus ulcers: Wound Care following.  CLL: Baseline leukocytosis trending 80-90 though 114 on admission which has now improved to 75. 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 -Continue Lovenox for now  #FEN:  -Diet: Soft  #DVT prophylaxis: Lovenox  #CODE STATUS: DNR/DNI -Defer to daughter Caryl Pina if patients lacks decision-making capacity -Confirmed with patient on admission   Dispo: Anticipated discharge in approximately 2-3 day(s).   LOS: 2 days   Riccardo Dubin, MD 04/25/2016, 12:36 PM Pager: @[phone] @

## 2016-04-25 NOTE — Progress Notes (Signed)
Pharmacy Antibiotic& Anticoagulation Note  Dale George is a 70 y.o. male admitted on 04/23/2016 with sepsis.  Pharmacy has been consulted for vanc/zosyn and LMWH dosing.  V/Z # 3 for sepsis, AF.  WBC elevated 2nd CLL. Creat 0.89 8/2 CT neck r/o abscess w/ hx tongue cancer, radical neck dissection: No abscess.  8/2 CXR: LL base opacity susps for PNA or aspiration   Pt recent PE/DVT, on Eliquis, transitioned to LMWH while unable to take PO; spoke w/ [facility] nurse who reports last dose of Eliquis was at 0800 on 8/2.  Hg 15.7>12.3, pltc LOW 76- low PLTC not new, had PTA 8/3 SLP rec NPO 8/4 big wt change 116 kg from 136kg in EPIC.  Plan: Decrease vanc to 1 gm q12 per obesity nomogram w/ wt change from 136 > 116 Continue Z 3.375 g q8h EI Decrease to LMWH 115 mg SQ Q12H and monitor CBC, f/u when to transition back to PO.           Weight: 255 lb 15.3 oz (116.1 kg)  Temp (24hrs), Avg:98.8 F (37.1 C), Min:97.6 F (36.4 C), Max:99.9 F (37.7 C)   Recent Labs Lab 04/23/16 1900 04/23/16 1924 04/23/16 2124 04/23/16 2354 04/24/16 0506 04/24/16 0927 04/25/16 0440  WBC 114.0*  --   --   --  83.0*  --  75.2*  CREATININE 1.09  --  1.04  --   --  0.95 0.89  LATICACIDVEN  --  2.22*  --  1.36  --   --   --     Estimated Creatinine Clearance: 110.7 mL/min (by C-G formula based on SCr of 0.89 mg/dL).    No Known Allergies  Antimicrobials this admission: 8/2 Zosyn >> 8/2 Vancomyicin >>  Dose adjustments this admission: 8/4 vanc dose changed from 1250 q12 to 1000 q12 for wt change 136>116 kg  Microbiology results: 8/2 UCx> ngF 8/2 BCx> ngtd 8/3 MRSA PCR +  Thank you for allowing pharmacy to be a part of this patient's care.  Eudelia Bunch, Pharm.D. [id number] 04/25/2016 8:48 AM

## 2016-04-25 NOTE — Progress Notes (Signed)
  Date: 04/25/2016  Patient name: Dale George  Medical record number: [id number]  Date of birth: 1946/02/26   This patient has been seen and the plan of care was discussed with the house staff. Please see their note for complete details. I concur with their findings with the following additions/corrections: I agree Mr Bae looks more comfortable despite tachypnea. Dr Posey Pronto and Lovena Le discussed end of life issues with pt and his daughter and have developed plan detailed in their note.  Bartholomew Crews, MD 04/25/2016, 3:01 PM

## 2016-04-25 NOTE — Progress Notes (Signed)
Speech Language Pathology Treatment: Dysphagia  Patient Details Name: Dale George MRN: [id number] DOB: 07/26/1946 Today's Date: 04/25/2016 Time: [id number] SLP Time Calculation (min) (ACUTE ONLY): 8 min  Assessment / Plan / Recommendation Clinical Impression  Pt's oral cavity looks improved from previous date: less dry, no active bleeding or dried secretions coating tongue/lips. His voice is clearer at baseline. Few ice chips provided with increase in RR, decrease in SpO2, wet vocal quality, and weak coughing. Pt remains at high aspiration risk. Per medical team, pt/daughter have requested comfort feeding and palliative approach. Dys 1 diet and thin liquids placed per their request. I do agree with this approach, although I continue to recommend palliative care involvement to facilitate family's understanding of the pt's chronic risks. SLP has reviewed education with pt and RN. Will sign off for now, but please re-order if we can be of further assistance.   HPI HPI: 70 year old male with MHx significant for dementia, CLL under observation, remote hx of tongue cancer (s/p radical neck surgery, XRT), CHF, HTN and recent bilateral PE with BL femoral DVT, left UE DVT and RV thrombus who presents with sepsis from pulmonary origin. CT soft tissue neck shows thickened epiglottis and hypopharynx which may be post-tx related but is also concerning for reoccurrence given progression since previous imaging. Pt had a recent MBS 03/14/16 with severe oropharyngeal dysphagia with nasopharyngeal reflux, severe residuals, silent aspiration, and very little ability to functionally consueme POs. Per SLP and MD notes from that admission, pt's daughter had opted to accept the risks of aspiration and allow PO intake (regular textures, thin liquids).       SLP Plan  Discharge SLP treatment due to (comment) ([place] for comfort)     Recommendations  Diet recommendations: Other(comment) (per medical team) Compensations:  Minimize environmental distractions;Slow rate;Small sips/bites;Follow solids with liquid;Hard cough after swallow Postural Changes and/or Swallow Maneuvers: Seated upright 90 degrees;Upright 30-60 min after meal;Head turn left during swallow             Oral Care Recommendations: Oral care QID Follow up Recommendations: 24 hour supervision/assistance Plan: Discharge SLP treatment due to (comment) ([place] for comfort)     GO                Germain Osgood 04/25/2016, 11:21 AM  Germain Osgood, M.A. CCC-SLP [phone]

## 2016-04-25 NOTE — Progress Notes (Signed)
Subjective: Dale George slept well overnight with less cough and sputum production. Denies pain and fever. Endorses decreased appetite.  Objective: Vital signs in last 24 hours: Vitals:   04/24/16 2100 04/25/16 0010 04/25/16 0430 04/25/16 0500  BP: (!) 155/94 (!) 125/105 124/78   Pulse: (!) 112 (!) 115 (!) 104   Resp: (!) 27 18 (!) 24   Temp: 99.1 F (37.3 C) 98.6 F (37 C) 97.8 F (36.6 C)   TempSrc: Oral Oral Axillary   SpO2: 94% 93% 96%   Weight:    116.1 kg (255 lb 15.3 oz)   Weight change: -19.979 kg (-44 lb 0.7 oz)  Intake/Output Summary (Last 24 hours) at 04/25/16 0828 Last data filed at 04/25/16 1517  Gross per 24 hour  Intake              350 ml  Output              725 ml  Net             -375 ml   BP 122/88 (BP Location: Left Arm)   Pulse (!) 114   Temp 97.4 F (36.3 C) (Oral)   Resp (!) 26   Wt 116.1 kg (255 lb 15.3 oz)   SpO2 93%   BMI 29.58 kg/m   General appearance: moderately obese male, alert, cooperative. At rest, in NAD. However, when moving patient for lung exam, he began to cough, became more dyspneic, and demonstrated great difficulty clearing his throat. Throat: Mild bleeding from oropharyngeal lesion. White and yellow ulcerative lesions on tongue and oropharynx.  Neck: Evidence of neck surgery. Firm nodule under L jaw. Lungs: Coarse wet breath sounds in all lung fields, improved air movement. No wheezes. Heart: tachycardic, regular rhythm, S1, S2 normal, no murmur, click, rub or gallop Abdomen: soft, non-tender; bowel sounds normal; no masses,  no organomegaly Extremities: venous stasis bilat below mid-calf. no edema, redness or tenderness in the calves or thighs  Lab Results: Component     Latest Ref Rng & Units 04/25/2016  Sodium     135 - 145 mmol/L 144  Potassium     3.5 - 5.1 mmol/L 3.6  Chloride     101 - 111 mmol/L 115 (H)  CO2     22 - 32 mmol/L 21 (L)  Glucose     65 - 99 mg/dL 103 (H)  BUN     6 - 20 mg/dL 16  Creatinine  0.61 - 1.24 mg/dL 0.89  Calcium     8.9 - 10.3 mg/dL 8.2 (L)  EGFR (Non-African Amer.)     >60 mL/min >60  EGFR (African American)     >60 mL/min >60  Anion gap     5 - 15 8  WBC     4.0 - 10.5 K/uL 75.2 (HH)  RBC     4.22 - 5.81 MIL/uL 4.38  Hemoglobin     13.0 - 17.0 g/dL 12.3 (L)  HCT     39.0 - 52.0 % 39.3  MCV     78.0 - 100.0 fL 89.7  MCH     26.0 - 34.0 pg 28.1  MCHC     30.0 - 36.0 g/dL 31.3  RDW     11.5 - 15.5 % 17.6 (H)  Platelets     150 - 400 K/uL 76 (L)   Micro Results: Recent Results (from the past 240 hour(s))  Culture, blood (Routine x 2)     Status:  None (Preliminary result)   Collection Time: 04/23/16  7:00 PM  Result Value Ref Range Status   Specimen Description BLOOD RIGHT FOREARM  Final   Special Requests IN PEDIATRIC BOTTLE 2CC  Final   Culture NO GROWTH < 24 HOURS  Final   Report Status PENDING  Incomplete  Urine culture     Status: None   Collection Time: 04/23/16  7:27 PM  Result Value Ref Range Status   Specimen Description URINE, RANDOM  Final   Special Requests NONE  Final   Culture NO GROWTH  Final   Report Status 04/24/2016 FINAL  Final  Culture, blood (Routine x 2)     Status: None (Preliminary result)   Collection Time: 04/23/16  7:58 PM  Result Value Ref Range Status   Specimen Description BLOOD LEFT ANTECUBITAL  Final   Special Requests BOTTLES DRAWN AEROBIC AND ANAEROBIC 10CC  Final   Culture NO GROWTH < 24 HOURS  Final   Report Status PENDING  Incomplete  MRSA PCR Screening     Status: Abnormal   Collection Time: 04/24/16  2:44 AM  Result Value Ref Range Status   MRSA by PCR POSITIVE (A) NEGATIVE Final    Comment:        The GeneXpert MRSA Assay (FDA approved for NASAL specimens only), is one component of a comprehensive MRSA colonization surveillance program. It is not intended to diagnose MRSA infection nor to guide or monitor treatment for MRSA infections. RESULT CALLED TO, READ BACK BY AND VERIFIED WITH: Karolee Stamps  RN [id number] 04/24/16 A BROWNING    Studies/Results: No new studies.  Medications: Scheduled Meds: . acetaminophen  650 mg Rectal Once  . antiseptic oral rinse  7 mL Mouth Rinse q12n4p  . chlorhexidine  15 mL Mouth Rinse BID  . Chlorhexidine Gluconate Cloth  6 each Topical Q0600  . collagenase   Topical Daily  . enoxaparin (LOVENOX) injection  135 mg Subcutaneous Q12H  . lidocaine  15 mL Mouth/Throat Once  . mupirocin ointment  1 application Nasal BID  . piperacillin-tazobactam (ZOSYN)  IV  3.375 g Intravenous Q8H  . sodium chloride flush  3 mL Intravenous Q12H  . vancomycin  1,250 mg Intravenous Q12H   Continuous Infusions:  PRN Meds:.acetaminophen, lidocaine, lidocaine, lidocaine-EPINEPHrine, oxymetazoline, silver nitrate applicators, TRIPLE ANTIBIOTIC   Assessment/Plan: Dale George is a 70 year old male with severe dysphagia 2/2 dementia and remote tongue cancer surgery, CLL, left buttock decubitus ulcer, and a recent DVT/PE hospitalized for sepsis 2/2 presumed recurrent aspiration pneumonia.   Sepsis 2/2 recurrent aspiration pneumonia: Dale George appears less sick today. He remains dyspneic and tachycardic, but he is breathing more comfortably with improved air movement. He is currently receiving iv zosyn and vancomycin for broad spectrum coverage. Daughter and patient wish to continue oral feeding despite risk of pneumonia recurrence. The team met with daughter Dale George [facility]) on 8/3 and discussed treatment plan. We will continue antibiotics and fluids (if necessary) for next 24 hours. If Dale George demonstrates further improvement and is able to be discharged, he will return to [facility] with PICC line and Dale George will revisit MOST form. If Dale George's clinical picture does not improve or worsens, will discuss options with daughter and will likely discontinue antibiotics and transfer to hospice care. Patient has completed MOST form in his chart.                         --Continue iv zosyn and  vancomycin for next 24 hours. If patient continues to improve, discuss PICC line placement with daughter and narrow antibiotic coverage.                       --Blood and urine cultures show no growth to date  Dysphagia: likely contributing factor in sepsis. Patient's daughter believes dysphagia is related to both worsening dementia and tongue cancer resection. Speech evaluated 8/3 and believe patient is high risk for aspiration. However, given DNR status and patient wishes to continue oral feeding, we will continue puree diet.  Ulcerative lesions on tongue and oropharynx: Unclear origin. Consider traumatic (suction tube-related) vs. Tongue cancer recurrence vs. Infectious. Discussed with ENT who agrees management should be palliative. We will manage symptomatically.  Decubitus ulcers: Wound care consulted. Not full thickness.  CLL: managed by Dr. Alvy Bimler. No further plans for treatment.  Recent PE, BL DVT, RV thrombus: Continue lovenox. Discussed with Dr. Alvy Bimler Dale George's Eliquis rx given his history of thromboembolism likely 2/2 malignancy. Though less established in the literature, NOACs are often more convenient for  patients and can be used as long as blood counts and renal function are carefully monitored. Because Dale George cannot tolerate po meds, lovenox is a better option for him at this point.   HTN: hold home meds in setting of sepsis.  FEN/GI: Give puree diet (dysphagia 1 diet) as tolerated by patient.  DVT prophylaxis: Continue lovenox.  Code Status: DNR, confirmed with daughter who is POA.  This is a Careers information officer Note.  The care of the patient was discussed with Dr. Posey Pronto and the assessment and plan formulated with their assistance.  Please see their attached note for official documentation of the daily encounter.   LOS: 2 days   Pershing Cox, Medical Student 04/25/2016, 8:28 AM

## 2016-04-25 NOTE — Progress Notes (Signed)
Pt HR irregular 120s-140s, unable to determine if Afib or Sinus Tach. Pt resting comfortably.  MD on call notified, orders given and EKG confirms ST with PACs.  Will continue to monitor.

## 2016-04-26 LAB — CBC
HEMATOCRIT: 40.7 % (ref 39.0–52.0)
HEMOGLOBIN: 13 g/dL (ref 13.0–17.0)
MCH: 28.3 pg (ref 26.0–34.0)
MCHC: 31.9 g/dL (ref 30.0–36.0)
MCV: 88.7 fL (ref 78.0–100.0)
Platelets: 77 10*3/uL — ABNORMAL LOW (ref 150–400)
RBC: 4.59 MIL/uL (ref 4.22–5.81)
RDW: 17.4 % — ABNORMAL HIGH (ref 11.5–15.5)
WBC: 65.1 10*3/uL (ref 4.0–10.5)

## 2016-04-26 LAB — BASIC METABOLIC PANEL
ANION GAP: 5 (ref 5–15)
BUN: 11 mg/dL (ref 6–20)
CO2: 23 mmol/L (ref 22–32)
Calcium: 8.1 mg/dL — ABNORMAL LOW (ref 8.9–10.3)
Chloride: 114 mmol/L — ABNORMAL HIGH (ref 101–111)
Creatinine, Ser: 0.68 mg/dL (ref 0.61–1.24)
GFR calc Af Amer: >60 mL/min (ref >60)
GLUCOSE: 98 mg/dL (ref 65–99)
POTASSIUM: 4.2 mmol/L (ref 3.5–5.1)
Sodium: 142 mmol/L (ref 135–145)

## 2016-04-26 MED ORDER — SODIUM CHLORIDE 0.9 % IV SOLN
3.0000 g | Freq: Three times a day (TID) | INTRAVENOUS | Status: DC
  Administered 2016-04-26 – 2016-04-29 (×9): 3 g via INTRAVENOUS
  Filled 2016-04-26 (×11): qty 3

## 2016-04-26 MED ORDER — SODIUM CHLORIDE 0.9 % IV SOLN
1.5000 g | Freq: Four times a day (QID) | INTRAVENOUS | Status: DC
  Filled 2016-04-26 (×2): qty 1.5

## 2016-04-26 MED ORDER — ENOXAPARIN SODIUM 120 MG/0.8ML ~~LOC~~ SOLN
115.0000 mg | Freq: Two times a day (BID) | SUBCUTANEOUS | Status: DC
  Administered 2016-04-26 – 2016-04-29 (×6): 115 mg via SUBCUTANEOUS
  Filled 2016-04-26 (×7): qty 0.77

## 2016-04-26 NOTE — Progress Notes (Signed)
Pharmacy Antibiotic Note  Dale George is a 70 y.o. male admitted on 04/23/2016 with pneumonia.  Pharmacy has been consulted for unasyn dosing.  Plan: Unasyn 3g IV q8h  Weight: 252 lb 13.9 oz (114.7 kg)  Temp (24hrs), Avg:97.7 F (36.5 C), Min:97.4 F (36.3 C), Max:98.4 F (36.9 C)   Recent Labs Lab 04/23/16 1900 04/23/16 1924 04/23/16 2124 04/23/16 2354 04/24/16 0506 04/24/16 0927 04/25/16 0440 04/26/16 0944  WBC 114.0*  --   --   --  83.0*  --  75.2* 65.1*  CREATININE 1.09  --  1.04  --   --  0.95 0.89 0.68  LATICACIDVEN  --  2.22*  --  1.36  --   --   --   --     CrCl: 98 ml/min based on adjusted body weight No Known Allergies  Antimicrobials this admission: 8/2 Zosyn >> 8/5 8/2 Vancomyicin >> 8/5 8/5 Unasyn >>  Dose adjustments this admission: 8/4 vanc dose changed from 1250 q12 to 1000 q12 for weight change 136>116 kg  Microbiology results: 8/2 UCx> ngF 8/2 BCx> ngtd 8/3 MRSA PCR +  Thank you for allowing pharmacy to be a part of this patient's care.  Carlean Jews, Pharm.D. PGY1 Pharmacy Resident 8/5/201712:11 PM Pager [phone]

## 2016-04-26 NOTE — Progress Notes (Signed)
   Subjective: This morning, he appeared improved and more comfortable without difficulty breathing and cough. No fevers overnight.  Objective:  Vital signs in last 24 hours: Vitals:   04/26/16 0200 04/26/16 0300 04/26/16 0500 04/26/16 0700  BP: 121/76 115/78  116/76  Pulse: (!) 107 (!) 118  (!) 133  Resp: (!) 31   (!) 28  Temp:    97.5 F (36.4 C)  TempSrc:    Axillary  SpO2: 93% 95%  95%  Weight:   252 lb 13.9 oz (114.7 kg)    General: elderly Caucasian male, resting in bed HEENT: Anicteric sclera, normocephalic and atraumatic Cardiac: irregular rate and rhythm, no rubs, murmurs or gallops Pulm: coarse breath sounds in the anterior lung fields without wheezing Abd: soft, nontender, nondistended, BS present Ext: chronic venous stasis changes in bilateral LE Neuro: responds to questions appropriately; moving all extremities freely, oriented to name and place but not year  Assessment/Plan:  Dale George is a 70 year old male with CLL, dementia, left buttock decubitus ulcer, h/o DVT/PE, h/o tongue cancer hospitalized for presumed sepsis 2/2 aspiration pneumonia.  Sepsis 2/2 aspiration pneumonia: Started on IV Zosyn and vancomycin for broad-spectrum coverage though narrowing would be limited by the fact that he is MRSA+ but review of CXR does not appear to be suggestive of MRSA infection. As we suspect aspiration was source of infection, we can narrow today. Discussions with daughter revealed she would a time-limited trial of antibiotics and fluids for at 24 to 48 hour intervals given priority placed on keeping patient comfortable.  -Narrow to IV Unasyn [Abx Day 4/7] -Consider PICC placement if he remains afebrile and hemodynamically stable on narrow regimen -Give IV fluids for hypotension [SBP<90] -If he does not show signs of clinical improvement, we will transition to full comfort care  Dysphagia: Likely multifactorial in the setting of his dementia and h/o tongue cancer. ENT  evaluation reassuring for no signs of malignant recurrence.  -Continue Soft Diet  Decubitus ulcers: Wound Care following.  CLL: Baseline leukocytosis trending 80-90 though 114 on admission which has now improved to 65. -Follow CBC  Thrombocytopenia: Platelets trending mostly 70s since admission and likely in the setting of acute illness. 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 -Continue Lovenox for now  #FEN:  -Diet: Soft  #DVT prophylaxis: Lovenox  #CODE STATUS: DNR/DNI -Defer to daughter Dale George if patients lacks decision-making capacity -Confirmed with patient on admission  Dispo: Anticipated discharge in approximately 2-3 day(s).   LOS: 3 days   Riccardo Dubin, MD 04/26/2016, 11:41 AM Pager: @[phone]

## 2016-04-26 NOTE — Evaluation (Signed)
Physical Therapy Evaluation Patient Details Name: Dale George MRN: [id number] DOB: 02-13-1946 Today's Date: 04/26/2016   History of Present Illness  70 y.o. male admitted to [facility] on 04/23/16 for sore throat, productive cough, dyspnea, tachycardia and tachypenia.  Pt dx with sepsis secondary to aspiration PNA, dysphagia, and thrombocytopenia. Pt with significant PMhx of CLL, HTN, gout, dementia, CKD, ataxia, and tongue CA.  Clinical Impression  Pt is self limiting and goes from very nice to very agitated in a very short period of time, without real cause.  Pt was initially agreeable to attempt EOB, but after therapist assisted him in moving his legs over the EOB it was as if he snapped becoming quite irritated with me and cussing all of a sudden. HR, which at rest was already in the 120s jumped into the 140s with the outburst.  Pt positioned back in the bed.   PT to follow acutely for deficits listed below.   Pt is very appropriate for SNF placement at discharge.     Follow Up Recommendations SNF    Equipment Recommendations  None recommended by PT    Recommendations for Other Services   NA    Precautions / Restrictions Precautions Precautions: Fall      Mobility  Bed Mobility Overal bed mobility: Needs Assistance Bed Mobility: Rolling;Supine to Sit Rolling: Mod assist   Supine to sit:  (see comments)     General bed mobility comments: Pt helping with upper trunk to roll to left side for repositioning of pillow.  Pt also able to help scoot up in bed by pulling overhead with his right hand with bed in trendelenberg. Started to progress pt's LEs EOB to attempt sitting with HOB elevated, however, after getting bil feet over EOB, all of a sudden, pt started cussing, yelling, "what and the hell are you doing to me?!" and became very agitated, no longer willing to attempt sitting EOB.  HR (although high to start with 120s, increased to 147 with out burst).  PT repositioned pt back in bed per  his angry request.  Pt reports he will be agreeable to try to sit up next session.                Pertinent Vitals/Pain Pain Assessment: Faces Faces Pain Scale: Hurts little more Pain Location: with AAROM to left shoulder.  Pain Descriptors / Indicators: Grimacing;Guarding Pain Intervention(s): Limited activity within patient's tolerance;Monitored during session;Repositioned    Home Living Family/patient expects to be discharged to:: Skilled nursing facility (from SNF per visitors)                      Prior Function Level of Independence: Needs assistance   Gait / Transfers Assistance Needed: Per visitors he has been essentially bed bound at SNF, they have only gotten him to roll.            Hand Dominance   Dominant Hand: Right    Extremity/Trunk Assessment   Upper Extremity Assessment: Defer to OT evaluation           Lower Extremity Assessment: RLE deficits/detail;LLE deficits/detail RLE Deficits / Details: bil LEs generally weak with difficulty lifting both legs against gravity.  Pt able to actively help move ankles, knees, and hips, but very weak and stiff throughout.   LLE Deficits / Details: bil LEs generally weak with difficulty lifting both legs against gravity.  Pt able to actively help move ankles, knees, and hips, but very weak and  stiff throughout.       Communication   Communication: No difficulties  Cognition Arousal/Alertness: Awake/alert Behavior During Therapy: Agitated (mostly very nice, but switches to agitated very quickly) Overall Cognitive Status: History of cognitive impairments - at baseline                               Assessment/Plan    PT Assessment Patient needs continued PT services  PT Diagnosis Difficulty walking;Abnormality of gait;Generalized weakness;Acute pain;Altered mental status   PT Problem List Decreased strength;Decreased activity tolerance;Decreased balance;Decreased mobility;Decreased range  of motion;Decreased cognition;Decreased knowledge of use of DME;Decreased safety awareness;Decreased knowledge of precautions;Cardiopulmonary status limiting activity;Pain;Impaired sensation  PT Treatment Interventions DME instruction;Functional mobility training;Therapeutic activities;Therapeutic exercise;Balance training;Neuromuscular re-education;Cognitive remediation;Patient/family education;Manual techniques   PT Goals (Current goals can be found in the Care Plan section) Acute Rehab PT Goals Patient Stated Goal: to rest today, not to sit up on the side of the bed.  PT Goal Formulation: Patient unable to participate in goal setting Time For Goal Achievement: 05/02/2016 Potential to Achieve Goals: Fair    Frequency Min 2X/week        End of Session   Activity Tolerance: Treatment limited secondary to agitation Patient left: in bed;with call bell/phone within reach;with bed alarm set;with family/visitor present Nurse Communication: Other (comment) (outburst, HR)         Time: [id number] PT Time Calculation (min) (ACUTE ONLY): 21 min   Charges:   PT Evaluation $PT Eval Moderate Complexity: 1 Procedure          Lauren Modisette B. Goodlow, [place], DPT [phone]   04/26/2016, 3:36 PM

## 2016-04-26 NOTE — Progress Notes (Signed)
Pt had not voided in several hours. This nurse bladder scanned with a result of >556mL. In and out Cath with a result of 632mL.

## 2016-04-26 NOTE — Progress Notes (Signed)
MD notified of pt's HR consistently staying between 120s and 150s. No new orders given at this time. Will continue to monitor.

## 2016-04-27 DIAGNOSIS — I4891 Unspecified atrial fibrillation: Secondary | ICD-10-CM | POA: Diagnosis present

## 2016-04-27 LAB — GLUCOSE, CAPILLARY: Glucose-Capillary: 89 mg/dL (ref 65–99)

## 2016-04-27 MED ORDER — METOPROLOL TARTRATE 5 MG/5ML IV SOLN
5.0000 mg | Freq: Once | INTRAVENOUS | Status: AC
  Administered 2016-04-27: 5 mg via INTRAVENOUS
  Filled 2016-04-27: qty 5

## 2016-04-27 NOTE — Progress Notes (Signed)
Subjective: Dale George states his cough is "much better" this morning. He denies any pain and continues to endorse decreased appetite. When asked if he would like to return to [place], states "whatever is most convenient."  Objective: Vital signs in last 24 hours: Vitals:   04/27/16 0525 04/27/16 0734 04/27/16 0809 04/27/16 1139  BP: 129/77 121/89 121/89 (!) 123/94  Pulse: 65 (!) 54 (!) 104 (!) 127  Resp: (!) 30 (!) 28 (!) 32 (!) 28  Temp: 98.5 F (36.9 C) 97.4 F (36.3 C) 97.8 F (36.6 C) 98 F (36.7 C)  TempSrc: Axillary Oral Oral Oral  SpO2: 96% 94% 97% 96%  Weight:       Weight change: -0.3 kg (-10.6 oz)  Intake/Output Summary (Last 24 hours) at 04/27/16 1317 Last data filed at 04/27/16 0837  Gross per 24 hour  Intake              150 ml  Output                0 ml  Net              150 ml   General appearance: moderately obese male, alert, cooperative, in NAD. Throat: Mild bleeding from oropharyngeal lesion. White and yellow ulcerative lesions on tongue and oropharynx.  Neck: Evidence of neck surgery. Lungs: + cough. Coarse wet breath sounds in all lung fields. No wheezes. Heart: tachycardic, irregular rhythm, S1, S2 normal, no murmur, click, rub or gallop Abdomen: soft, non-tender; bowel sounds normal; no masses, no organomegaly Extremities: venous stasis bilat below mid-calf. no edema, redness or tenderness in the calves or thighs  Lab Results: No new labs.  Micro Results: Recent Results (from the past 240 hour(s))  Culture, blood (Routine x 2)     Status: None (Preliminary result)   Collection Time: 04/23/16  7:00 PM  Result Value Ref Range Status   Specimen Description BLOOD RIGHT FOREARM  Final   Special Requests IN PEDIATRIC BOTTLE 2CC  Final   Culture NO GROWTH 3 DAYS  Final   Report Status PENDING  Incomplete  Urine culture     Status: None   Collection Time: 04/23/16  7:27 PM  Result Value Ref Range Status   Specimen Description URINE, RANDOM   Final   Special Requests NONE  Final   Culture NO GROWTH  Final   Report Status 04/24/2016 FINAL  Final  Culture, blood (Routine x 2)     Status: None (Preliminary result)   Collection Time: 04/23/16  7:58 PM  Result Value Ref Range Status   Specimen Description BLOOD LEFT ANTECUBITAL  Final   Special Requests BOTTLES DRAWN AEROBIC AND ANAEROBIC 10CC  Final   Culture NO GROWTH 3 DAYS  Final   Report Status PENDING  Incomplete  MRSA PCR Screening     Status: Abnormal   Collection Time: 04/24/16  2:44 AM  Result Value Ref Range Status   MRSA by PCR POSITIVE (A) NEGATIVE Final    Comment:        The GeneXpert MRSA Assay (FDA approved for NASAL specimens only), is one component of a comprehensive MRSA colonization surveillance program. It is not intended to diagnose MRSA infection nor to guide or monitor treatment for MRSA infections. RESULT CALLED TO, READ BACK BY AND VERIFIED WITH: Karolee Stamps RN [id number] 04/24/16 A BROWNING    Studies/Results: No results found.   Medications:  Scheduled Meds: . acetaminophen  650 mg Rectal Once  . ampicillin-sulbactam (  UNASYN) IV  3 g Intravenous Q8H  . antiseptic oral rinse  7 mL Mouth Rinse q12n4p  . chlorhexidine  15 mL Mouth Rinse BID  . Chlorhexidine Gluconate Cloth  6 each Topical Q0600  . collagenase   Topical Daily  . enoxaparin (LOVENOX) injection  115 mg Subcutaneous Q12H  . lidocaine  15 mL Mouth/Throat Once  . mupirocin ointment  1 application Nasal BID  . sodium chloride flush  3 mL Intravenous Q12H   Continuous Infusions:  PRN Meds:.acetaminophen, lidocaine, lidocaine, lidocaine-EPINEPHrine, oxymetazoline, silver nitrate applicators, TRIPLE ANTIBIOTIC   Assessment/Plan: Dale George is a 70 year old male with severe dysphagia 2/2 dementia and remote tongue cancer surgery, CLL, left buttock decubitus ulcer, and a recent DVT/PE hospitalized for sepsis 2/2 presumed recurrent aspiration pneumonia.   Sepsis 2/2 recurrent aspiration  pneumonia: Improving. He remains dyspneic and tachycardic, but he is breathing more comfortably.  Switched from iv zosyn and vancomycin to iv unasyn yesterday (CXR does not appear suggestive of MRSA infection). Daughter and patient wish to continue oral feeding despite risk of pneumonia recurrence. The team met with daughter Caryl Pina [facility]) on 8/3 and discussed treatment plan. Because Dale George is improving with antibiotics, we will plan for discharge back to [facility] after he completes antibiotic course. If his condition worsens, priority is to keep patient comfortable. Patient has completed MOST form in his chart.  --Continue iv unasyn (day 5/7) --Blood and urine cultures show no growth to date  New onset Atrial fibrillation with RVR: Patient has been tachycardic throughout admission. HR briefly into the 180's overnight. He received 5 mg metoprolol intravenously and HR returned to 110's. Patient asymptomatic while in A fib. We will not control rate at this time given lack of symptoms and treatment priority for comfort. He is already on anticoagulation.  Dysphagia:likely contributing factor in sepsis. Patient's daughter believes dysphagia is related to both worseningdementia and tongue cancer resection. Speech evaluated 8/3 and believe patient is high risk for aspiration. However, given DNR status and patient wishes to continue oral feeding, we will continue puree diet.  Ulcerative lesions on tongue and oropharynx:Unclear origin. Consider traumatic (suction tube-related) vs. Tongue cancer recurrence vs. Infectious. Discussed with ENT who agrees management should be palliative. We will manage symptomatically.  Decubitus ulcers:Wound care consulted. Not full thickness.  CLL: managed by Dr. Alvy Bimler. No further plans for treatment.  Recent PE, BL DVT, RV thrombus:Continue lovenox.  BVA:POLI home meds in setting of sepsis.  FEN/GI: Give puree  diet (dysphagia 1 diet) as tolerated by patient.  DVT prophylaxis: Continue lovenox.  Code Status:DNR, confirmed with daughter who is POA.  Disposition: Anticipate discharge to [facility] in 1-2 days.  This is a Careers information officer Note.  The care of the patient was discussed with Dr. Posey Pronto and the assessment and plan formulated with their assistance.  Please see their attached note for official documentation of the daily encounter.   LOS: 4 days   Pershing Cox, Medical Student 04/27/2016, 1:17 PM

## 2016-04-27 NOTE — NC FL2 (Signed)
[place] LEVEL OF CARE SCREENING TOOL     IDENTIFICATION  Patient Name: Dale George Birthdate: 08-07-1946 Sex: male Admission Date (Current Location): 04/23/2016  [facility] and [place] Number:  Herbalist and Address:  [facility] [place], [place] [address] [place], [place] [address]      Provider Number: [id number]  Attending Physician Name and Address:  Bartholomew Crews, MD  Relative Name and Phone Number:       Current Level of Care: Hospital Recommended Level of Care: [place] Prior Approval Number:    Date Approved/Denied:   PASRR Number:   [id number]   Discharge Plan: SNF    Current Diagnoses: Patient Active Problem List   Diagnosis Date Noted  . Atrial fibrillation with RVR ([place]) 04/27/2016  . Rhinovirus infection 03/13/2016  . Dysphagia 03/12/2016  . Acute pulmonary embolism ([place]) 03/11/2016  . Acute deep vein thrombosis (DVT) of brachial vein of left upper extremity ([place]) 03/11/2016  . Pressure ulcer 03/06/2016  . Rhabdomyolysis 03/05/2016  . Rectal bleeding 03/05/2016  . Sepsis ([place]) 03/05/2016  . Left arm pain 03/05/2016  . CKD (chronic kidney disease), stage III 03/05/2016  . Essential hypertension 06/26/2015  . Hyperkalemia 03/30/2015  . Fall   . Acute kidney injury ([place]) 03/29/2015  . AKI (acute kidney injury) ([place]) 03/29/2015  . Candidiasis of mouth 12/25/2014  . Other fatigue 12/25/2014  . Thrombocytopenia ([place]) 09/25/2014  . CLL (chronic lymphocytic leukemia) ([place]) 12/23/2013  . Lymphocytosis 12/14/2013  . Fatigue 12/14/2013  . History of tongue cancer 12/14/2013  . Hyperlipidemia 03/11/2013  . HTN (hypertension) 03/11/2013  . Arthritis 03/11/2013  . History of gout 03/11/2013    Orientation RESPIRATION BLADDER Height & Weight     Self  O2 (2l/min) Incontinent Weight: 252 lb 3.3 oz (114.4 kg) Height:     BEHAVIORAL SYMPTOMS/MOOD NEUROLOGICAL BOWEL NUTRITION STATUS   (None)   (none) Continent Diet (DYS1)  AMBULATORY STATUS COMMUNICATION OF NEEDS Skin   Extensive Assist Verbally Surgical wounds                       Personal Care Assistance Level of Assistance  Bathing, Feeding, Dressing Bathing Assistance: Limited assistance Feeding assistance: Independent Dressing Assistance: Limited assistance     Functional Limitations Info  Sight, Hearing, Speech Sight Info: Adequate Hearing Info: Adequate Speech Info: Adequate    SPECIAL CARE FACTORS FREQUENCY  PT (By licensed PT)     PT Frequency:  (5X/WEEK)              Contractures Contractures Info: Not present    Additional Factors Info  Code Status, Allergies Code Status Info:  (DNR) Allergies Info:  (NKDA)           Current Medications (04/27/2016):  This is the current hospital active medication list Current Facility-Administered Medications  Medication Dose Route Frequency Provider Last Rate Last Dose  . acetaminophen (TYLENOL) suppository 650 mg  650 mg Rectal Once Tanna Furry, MD      . acetaminophen (TYLENOL) suppository 650 mg  650 mg Rectal Q4H PRN Carly J Rivet, MD      . Ampicillin-Sulbactam (UNASYN) 3 g in sodium chloride 0.9 % 100 mL IVPB  3 g Intravenous Q8H Bartholomew Crews, MD   3 g at 04/27/16 [phone]  . antiseptic oral rinse (CPC / CETYLPYRIDINIUM CHLORIDE 0.05%) solution 7 mL  7 mL Mouth Rinse q12n4p Bartholomew Crews, MD  7 mL at 04/26/16 1600  . chlorhexidine (PERIDEX) 0.12 % solution 15 mL  15 mL Mouth Rinse BID Bartholomew Crews, MD   15 mL at 04/27/16 1003  . Chlorhexidine Gluconate Cloth 2 % PADS 6 each  6 each Topical Q0600 Bartholomew Crews, MD   6 each at 04/27/16 [phone]  . collagenase (SANTYL) ointment   Topical Daily Bartholomew Crews, MD      . enoxaparin (LOVENOX) injection 115 mg  115 mg Subcutaneous Q12H Bartholomew Crews, MD   115 mg at 04/27/16 [phone]  . lidocaine (XYLOCAINE) 2 % jelly 1 application  1 application Topical Once PRN Izora Gala, MD       . lidocaine (XYLOCAINE) 2 % viscous mouth solution 15 mL  15 mL Mouth/Throat Once Izora Gala, MD      . lidocaine (XYLOCAINE) 4 % external solution 0-50 mL  0-50 mL Topical Once PRN Izora Gala, MD      . lidocaine-EPINEPHrine (XYLOCAINE-EPINEPHrine) 1 %-1:200000 (PF) injection 0-30 mL  0-30 mL Intradermal Once PRN Izora Gala, MD      . mupirocin ointment (BACTROBAN) 2 % 1 application  1 application Nasal BID Bartholomew Crews, MD   1 application at [phone]  . oxymetazoline (AFRIN) 0.05 % nasal spray 1 spray  1 spray Each Nare Once PRN Izora Gala, MD      . silver nitrate applicators applicator 1 Stick  1 Stick Topical Once PRN Izora Gala, MD      . sodium chloride flush (NS) 0.9 % injection 3 mL  3 mL Intravenous Q12H Carly J Rivet, MD   3 mL at 04/27/16 1005  . TRIPLE ANTIBIOTIC [id number] OINT 1 application  1 application Apply externally Once PRN Izora Gala, MD         Discharge Medications: Please see discharge summary for a list of discharge medications.  Relevant Imaging Results:  Relevant Lab Results:   Additional Information SS#: [id number]  Junie Spencer, LCSW

## 2016-04-27 NOTE — Progress Notes (Signed)
   Subjective: This morning, he had no complaints. Overnight, his heart rate jumped to the 180s, but he denies recalling this or having any symptoms as a result of it. He denies prior history of atrial fibrillation.  Objective:  Vital signs in last 24 hours: Vitals:   04/27/16 0809 04/27/16 1139 04/27/16 1552 04/27/16 1815  BP: 121/89 (!) 123/94 115/69 115/88  Pulse: (!) 104 (!) 127 (!) 130 (!) 134  Resp: (!) 32 (!) 28 (!) 38 (!) 24  Temp: 97.8 F (36.6 C) 98 F (36.7 C) 97.6 F (36.4 C) 97.8 F (36.6 C)  TempSrc: Oral Oral Oral Oral  SpO2: 97% 96% 97% 96%  Weight:       General: elderly Caucasian male, resting in bed HEENT: anicteric sclera, normocephalic and atraumatic Cardiac: irregular rate and rhythm, no rubs, murmurs or gallops Pulm: coarse breath sounds in the anterior lung fields without wheezing Abd: soft, nontender, nondistended, BS present Ext: chronic venous stasis changes in bilateral LE Neuro: responds to questions appropriately; moving all extremities freely, oriented to name and place but not year  Assessment/Plan:  Mr. Skemp is a 70 year old male with CLL, dementia, left buttock decubitus ulcer, h/o DVT/PE, h/o tongue cancer hospitalized for presumed sepsis 2/2 aspiration pneumonia found to have new-onset atrial fibrillation with RVR.  Atrial fibrillation with RVR: Likely in the setting of acute illness. Given his code status and goals of care, I don't know the benefit of continued telemetry or how easy it is from a swallow oral medication. He is already on anticoagulation. -Transfer to MedSurg and discontinue cardiac monitoring -Treat atrial fibrillation only if it is symptomatic and causing discomfort  Sepsis 2/2 aspiration pneumonia: Clinically stable on this regimen. -Continue IV Unasyn [Abx Day 5/7]  Dysphagia: Likely multifactorial in the setting of his dementia and h/o tongue cancer. ENT evaluation reassuring for no signs of malignant recurrence.    -Continue Soft Diet  Decubitus ulcers: Wound Care following.  CLL: Baseline leukocytosis trending 80-90 though 114 on admission which improved to 65.  Thrombocytopenia: Platelets trending mostly 70s since admission and likely in the setting of acute illness.  H/o DVT/PE: Unclear if related to CLL. -Continue Lovenox for now  #FEN:  -Diet: Soft  #DVT prophylaxis: Lovenox  #CODE STATUS: DNR/DNI -Defer to daughter Caryl Pina if patients lacks decision-making capacity -Confirmed with patient on admission  Dispo: Anticipated discharge in approximately 2-3 day(s).   LOS: 4 days   Riccardo Dubin, MD 04/27/2016, 8:36 PM Pager: @[phone] @

## 2016-04-27 NOTE — Progress Notes (Signed)
Paged regarding HRs in 180s.  Evaluated patient at bedside and reviewed telemetry.  Pt sleeping comfortably, easily arousable, denies CP, SOB, palpitations, dizziness, and any pain.  HR 130s at the time, AF vs atrial tach on monitor.  Tachypneic, O2 95% on 2L.  Has remained afebrile.  On review of telemetry, HRs in 180s for ~5 minutes around 0500, AFib. EKG from 0510 HR 136, AFib.  Pt now in AFib, previously sinus tach with many PACs.  Hemodynamically stable and asymptomatic, mental status unchanged.  No urgent need for rate control.  Will discuss rate control with day team.

## 2016-04-27 NOTE — Progress Notes (Signed)
Pt HR elevated to 180s for a few moments and then in the 130s thereafter.  Pt states he felt short of breath, but has since resolved.  Last BP 129/77 RR 30 on 2L [place] 96% Ox sat.  EKG confirmed Afib RVR.  MD on call notified.  No new orders given.  Will continue to monitor.

## 2016-04-27 NOTE — Progress Notes (Signed)
New Onset Atrial Fibrillation with RVR CHADS2-VASC at least 2, already on therapeutic Lovenox for DVT treatment. Gave metoprolol 5 mg IV once.  Patient does not have CHF, known COPD or asthma, could consider long term rate control with BB or with diltiazem.   Martyn Malay, DO PGY-3 Internal Medicine Resident Pager # [phone] 04/27/2016 6:51 AM

## 2016-04-28 DIAGNOSIS — Z86718 Personal history of other venous thrombosis and embolism: Secondary | ICD-10-CM

## 2016-04-28 DIAGNOSIS — I4891 Unspecified atrial fibrillation: Secondary | ICD-10-CM

## 2016-04-28 DIAGNOSIS — D696 Thrombocytopenia, unspecified: Secondary | ICD-10-CM

## 2016-04-28 DIAGNOSIS — Z66 Do not resuscitate: Secondary | ICD-10-CM

## 2016-04-28 LAB — CULTURE, BLOOD (ROUTINE X 2)
CULTURE: NO GROWTH
Culture: NO GROWTH

## 2016-04-28 MED ORDER — COLLAGENASE 250 UNIT/GM EX OINT: TOPICAL_OINTMENT | Freq: Every day | CUTANEOUS | 0 refills | Status: AC

## 2016-04-28 MED ORDER — ENOXAPARIN SODIUM 100 MG/ML ~~LOC~~ SOLN: 115.0000 mg | Freq: Two times a day (BID) | SUBCUTANEOUS | 0 refills | Status: AC

## 2016-04-28 NOTE — Progress Notes (Signed)
  Date: 04/28/2016  Patient name: Dale George  Medical record number: [id number]  Date of birth: 1945/11/05   This patient has been seen and the plan of care was discussed with the house staff. Please see their note for complete details. I concur with their findings with the following additions/corrections: Mr Demma cont to have productive cough but is afebrile, not requiring supplemental O2, and overall looking better. A Fib with RVR will not be aggressively tx as he is not symptomatic but he will be his anticoagulation. Likely D/C home tomorrow.  Bartholomew Crews, MD 04/28/2016, 5:15 PM

## 2016-04-28 NOTE — Progress Notes (Signed)
Subjective: Dale George is feeling "okay" this morning. He states he slept well and that his cough and breathing are improving. He denies pain. Continues to endorse decreased appetite.   *talked with daughter, Dale George. We will revisit MOST form while Dale George is in the hospital.  Objective: Vital signs in last 24 hours: Vitals:   04/27/16 1139 04/27/16 1552 04/27/16 1815 04/28/16 0626  BP: (!) 123/94 115/69 115/88 (!) 127/92  Pulse: (!) 127 (!) 130 (!) 134 (!) 128  Resp: (!) 28 (!) 38 (!) 24 16  Temp: 98 F (36.7 C) 97.6 F (36.4 C) 97.8 F (36.6 C) 98.8 F (37.1 C)  TempSrc: Oral Oral Oral Axillary  SpO2: 96% 97% 96% 96%  Weight:       Weight change:   Intake/Output Summary (Last 24 hours) at 04/28/16 1424 Last data filed at 04/28/16 0830  Gross per 24 hour  Intake              240 ml  Output              350 ml  Net             -110 ml   Physical Exam: General: somewhat drowsy but answers questions appropriately. Moderately obese male in NAD. Throat: + cough, dry mucous membranes. Lungs: Coarse breath sounds throughout lung fields. Slight expiratory wheeze. Heart: Tachycardic, irregularly irregular rhythm. No murmur. Abd: non-tender, non-distended.  Extremities: venous stasis bilat below mid-calf. No edema.  Lab Results: No new lab results.  Micro Results: Recent Results (from the past 240 hour(s))  Culture, blood (Routine x 2)     Status: None (Preliminary result)   Collection Time: 04/23/16  7:00 PM  Result Value Ref Range Status   Specimen Description BLOOD RIGHT FOREARM  Final   Special Requests IN PEDIATRIC BOTTLE 2CC  Final   Culture NO GROWTH 4 DAYS  Final   Report Status PENDING  Incomplete  Urine culture     Status: None   Collection Time: 04/23/16  7:27 PM  Result Value Ref Range Status   Specimen Description URINE, RANDOM  Final   Special Requests NONE  Final   Culture NO GROWTH  Final   Report Status 04/24/2016 FINAL  Final  Culture, blood  (Routine x 2)     Status: None (Preliminary result)   Collection Time: 04/23/16  7:58 PM  Result Value Ref Range Status   Specimen Description BLOOD LEFT ANTECUBITAL  Final   Special Requests BOTTLES DRAWN AEROBIC AND ANAEROBIC 10CC  Final   Culture NO GROWTH 4 DAYS  Final   Report Status PENDING  Incomplete  MRSA PCR Screening     Status: Abnormal   Collection Time: 04/24/16  2:44 AM  Result Value Ref Range Status   MRSA by PCR POSITIVE (A) NEGATIVE Final    Comment:        The GeneXpert MRSA Assay (FDA approved for NASAL specimens only), is one component of a comprehensive MRSA colonization surveillance program. It is not intended to diagnose MRSA infection nor to guide or monitor treatment for MRSA infections. RESULT CALLED TO, READ BACK BY AND VERIFIED WITH: Karolee Stamps RN [id number] 04/24/16 A BROWNING    Studies/Results: No results found. Medications: Scheduled Meds: . ampicillin-sulbactam (UNASYN) IV  3 g Intravenous Q8H  . antiseptic oral rinse  7 mL Mouth Rinse q12n4p  . chlorhexidine  15 mL Mouth Rinse BID  . Chlorhexidine Gluconate Cloth  6 each Topical Q0600  .  collagenase   Topical Daily  . enoxaparin (LOVENOX) injection  115 mg Subcutaneous Q12H  . mupirocin ointment  1 application Nasal BID  . sodium chloride flush  3 mL Intravenous Q12H   Continuous Infusions:  PRN Meds:.acetaminophen, oxymetazoline   Assessment/Plan: Dale George is a 70 year old male with severe dysphagia 2/2 dementia and remote tongue cancer surgery, CLL, left buttock decubitus ulcer, and a recent DVT/PE hospitalized for sepsis 2/2 presumed recurrent aspiration pneumonia.   Sepsis 2/2 recurrent aspiration pneumonia:Continuing to improve. He remains dyspneic and tachycardic, but he is breathing more comfortably.  Switched from iv zosyn and vancomycin to iv unasyn on 8/5 (CXR does not appear suggestive of MRSA infection). Daughter and patient wish to continue oral feeding despite risk of pneumonia  recurrence. The team met with daughter Dale [facility]) on 8/3 and discussed treatment plan. Because Dale George is improving with antibiotics, we will plan for discharge back to [facility] after he completes antibiotic course. If his condition worsens, priority is to keep patient comfortable. Patient has completed MOST form in his chart.  --Continue iv unasyn (day 5/7) --Blood and urine cultures show no growth to date            --Will contact PCP who helped patient fill out MOST form to gather more information about patient wishes  New onset Atrial fibrillation with RVR: Patient has been tachycardic throughout admission (range 110's-130's). Developed A fib with RVR early 8/6 and received 1 dose iv metoprolol 5 mg for rate control. Patient asymptomatic while in A fib. We will not control rate at this time given lack of symptoms and treatment priority for comfort. He is already on anticoagulation.  Dysphagia:likely contributing factor in sepsis. Patient's daughter believes dysphagia is related to both worseningdementia and tongue cancer resection. Speech evaluated 8/3and believe patient is high risk for aspiration. However, given DNR status and patient wishes to continue oral feeding, we will continuepuree diet.  Ulcerative lesions on tongue and oropharynx:Unclear origin. Consider traumatic (suction tube-related) vs. Tongue cancer recurrence vs. Infectious. Discussed with ENT who agrees management should be palliative. We will manage symptomatically.  Decubitus ulcers:Wound care consulted. Not full thickness.  CLL: managed by Dr. Alvy Bimler. No further plans for treatment.  Recent PE, BL DVT, RV thrombus:Continue lovenox.  YQM:GNOIBBCWU normotensive and recovering from sepsis. Continue to hold home meds  FEN/GI: Give puree diet (dysphagia 1 diet) as tolerated by patient.  DVT prophylaxis: Continue lovenox.  Code Status:DNR, confirmed with  daughter who is POA.  Disposition: Anticipate discharge to [facility] either tomorrow or Wed.  This is a Careers information officer Note.  The care of the patient was discussed with Dr. Posey Pronto and the assessment and plan formulated with their assistance.  Please see their attached note for official documentation of the daily encounter.   LOS: 5 days   Pershing Cox, Medical Student 04/28/2016, 2:24 PM

## 2016-04-28 NOTE — Progress Notes (Signed)
   Subjective: This morning, he appeared comfortable and did not have any complaints, like pain, nausea, vomiting. He continues to have poor appetite. We explained to him that he has today and tomorrow left to finish his antibiotics and that we would work on getting him back to his facility to which he expressed understanding.  Objective:  Vital signs in last 24 hours: Vitals:   04/27/16 1139 04/27/16 1552 04/27/16 1815 04/28/16 0626  BP: (!) 123/94 115/69 115/88 (!) 127/92  Pulse: (!) 127 (!) 130 (!) 134 (!) 128  Resp: (!) 28 (!) 38 (!) 24 16  Temp: 98 F (36.7 C) 97.6 F (36.4 C) 97.8 F (36.6 C) 98.8 F (37.1 C)  TempSrc: Oral Oral Oral Axillary  SpO2: 96% 97% 96% 96%  Weight:       Physical Exam  Constitutional: No distress.  Chronically ill appearing Caucasian male  HENT:  Head: Normocephalic and atraumatic.  Eyes: Conjunctivae are normal. No scleral icterus.  Cardiovascular:  Irregular rhythm  Pulmonary/Chest: Effort normal.  Rhoncherous, coarse breath sounds at the bases bilaterally   Assessment/Plan:  Principal Problem:   Aspiration pneumonia ([place]) Active Problems:   History of tongue cancer   CLL (chronic lymphocytic leukemia) (HCC)   Dysphagia   Atrial fibrillation with RVR (HCC)   Personal history of DVT (deep vein thrombosis)  Mr. Cuddihy is a 70 year old male with CLL, dementia, left buttock decubitus ulcer, h/o DVT/PE, h/o tongue cancer hospitalized for presumed sepsis 2/2 aspiration pneumonia found to have new-onset atrial fibrillation with resolved RVR.  Atrial fibrillation with RVR: Likely in the setting of acute infection. HR trending 100s-130s off medication. He continues to remain asymptomatic and not dyspenic as we weaned him to room air. -Treat atrial fibrillation only if it is symptomatic and causing discomfort with metoprolol 5mg  IV  Sepsis 2/2 aspiration pneumonia: Continue IV Unasyn [Abx Day 6/7]  Dysphagia: Likely multifactorial in the  setting of his dementia and h/o tongue cancer. ENT evaluation reassuring for no signs of malignant recurrence.  -Continue Soft Diet  Decubitus ulcers: Wound Care following.  CLL: Baseline leukocytosis trending 80-90.   Thrombocytopenia: Platelets trending mostly 70s since admission and likely in the setting of acute illness.  H/o DVT/PE: Unclear if related to CLL. -Continue Lovenox for now  #FEN:  -Diet: Soft  #DVT prophylaxis: Lovenox  #CODE STATUS: DNR/DNI -Defer to daughter Caryl Pina if patients lacks decision-making capacity -Confirmed with patient on admission  Dispo: Anticipated discharge in approximately 1 day(s).   LOS: 5 days   Riccardo Dubin, MD 04/28/2016, 2:02 PM Pager: @[email or web address] @

## 2016-04-29 LAB — CBC
HEMATOCRIT: 39.5 % (ref 39.0–52.0)
Hemoglobin: 12.3 g/dL — ABNORMAL LOW (ref 13.0–17.0)
MCH: 27.6 pg (ref 26.0–34.0)
MCHC: 31.1 g/dL (ref 30.0–36.0)
MCV: 88.8 fL (ref 78.0–100.0)
PLATELETS: 87 10*3/uL — AB (ref 150–400)
RBC: 4.45 MIL/uL (ref 4.22–5.81)
RDW: 17.1 % — AB (ref 11.5–15.5)
WBC: 62.7 10*3/uL (ref 4.0–10.5)

## 2016-04-29 MED ORDER — SODIUM CHLORIDE 0.9 % IV SOLN
3.0000 g | Freq: Three times a day (TID) | INTRAVENOUS | Status: DC
  Filled 2016-04-29: qty 3

## 2016-04-29 NOTE — Progress Notes (Signed)
Physical Therapy Treatment Patient Details Name: Dale George MRN: [id number] DOB: 1945-11-23 Today's Date: 04/29/2016    History of Present Illness 70 y.o. male admitted to [facility] on 04/23/16 for sore throat, productive cough, dyspnea, tachycardia and tachypenia.  Pt dx with sepsis secondary to aspiration PNA, dysphagia, and thrombocytopenia. Pt with significant PMhx of CLL, HTN, gout, dementia, CKD, ataxia, and tongue CA.    PT Comments    Pt performed increased mobility and increased activity tolerance.  Pt currently requires +2 max/total assist.  Pt fatigues easily and would benefit from continued skilled rehab before return home to improve strength and decrease caregiver burden.     Follow Up Recommendations  SNF     Equipment Recommendations  None recommended by PT    Recommendations for Other Services       Precautions / Restrictions Precautions Precautions: Fall Restrictions Weight Bearing Restrictions: No    Mobility  Bed Mobility Overal bed mobility: Needs Assistance Bed Mobility: Rolling;Supine to Sit Rolling: Mod assist;Max assist   Supine to sit: Max assist;+2 for physical assistance     General bed mobility comments: Pt required multiple bouts of rolling after found with stool incontinence on arrival.  Pt performed rolling to right and left with mod to max assistance.  Pt required assist for advancing B LEs to edge of bed and elevating trunk.  Pt sat edge of bed x 10 min and fatigued quickly.  Pt required max to total assistance to sit edge of bed.    Transfers Overall transfer level: Needs assistance Equipment used: None (would benefit from stedy frame for standing next visit, or sara sit to stand lift.  ) Transfers: Squat Pivot Transfers (attempted sit to stand x 2 trials and patient unable to clear bottom.  PTA then elevated Bed height and performed squat pivot with assist from CNA.  Pt sat on end of seat and required total assist +3 to scoot back into  recliner.  Pt extremely fatigued.  )     Squat pivot transfers: Total assist;+2 physical assistance     General transfer comment: Pt able to reach for L rail with L hand and follow commands for forward weight shifting.  Pt presents with weakness and would benefit from skilled rehab to improve transfers before returning home.    Ambulation/Gait Ambulation/Gait assistance:  (not performed. )               Stairs            Wheelchair Mobility    Modified Rankin (Stroke Patients Only)       Balance Overall balance assessment: Needs assistance   Sitting balance-Leahy Scale: Zero       Standing balance-Leahy Scale: Zero                      Cognition Arousal/Alertness: Awake/alert Behavior During Therapy: WFL for tasks assessed/performed (becomes slightly agitated but able to redirect.  ) Overall Cognitive Status: History of cognitive impairments - at baseline                      Exercises      General Comments        Pertinent Vitals/Pain Pain Assessment: Faces Faces Pain Scale: Hurts even more Pain Location: bottom and L arm.   Pain Descriptors / Indicators: Grimacing;Guarding Pain Intervention(s): Limited activity within patient's tolerance;Repositioned (repositioned mepilex border on patients bottom to reduce further breakdown of skin.  )  Home Living                      Prior Function            PT Goals (current goals can now be found in the care plan section) Acute Rehab PT Goals Patient Stated Goal: To get cleaned up and to the chair Potential to Achieve Goals: Fair Progress towards PT goals: Progressing toward goals    Frequency  Min 2X/week    PT Plan Current plan remains appropriate    Co-evaluation             End of Session Equipment Utilized During Treatment: Gait belt Activity Tolerance: Treatment limited secondary to agitation Patient left: in chair;with nursing/sitter in room (left  patient reclined in chair with NT present to reapply condom cath.  )     Time: [id number] PT Time Calculation (min) (ACUTE ONLY): 35 min  Charges:  $Therapeutic Activity: 23-37 mins                    G Codes:      Cristela Blue 2016/05/11, 3:06 PM Governor Rooks, PTA pager [phone]

## 2016-04-29 NOTE — Progress Notes (Signed)
  Date: 04/29/2016  Patient name: Dale George  Medical record number: [id number]  Date of birth: March 08, 1946   This patient has been seen and the plan of care was discussed with the house staff. Please see their note for complete details. I concur with their findings with the following additions/corrections: [name] is comfortable, off O2, and HR in the low 100's. He completes his ABX today for aspiration pNA and will return to his SNF. Team will discuss palliative care at SNF with the daughter.  Bartholomew Crews, MD 04/29/2016, 1:18 PM

## 2016-04-29 NOTE — Discharge Summary (Signed)
Name: Dale George MRN: [id number] DOB: 1946/05/27 70 y.o. PCP: Maury Dus, MD  Date of Admission: 04/23/2016  6:43 PM Date of Discharge: 04/29/2016 Attending Physician: Bartholomew Crews, MD  Discharge Diagnosis: Principal Problem:   Aspiration pneumonia [facility]) Active Problems:   History of tongue cancer   CLL (chronic lymphocytic leukemia) ([place])   Pressure ulcer   Dysphagia   Atrial fibrillation with RVR ([place])   Personal history of DVT (deep vein thrombosis)   Discharge Medications:   Medication List    STOP taking these medications   acetaminophen 325 MG tablet Commonly known as:  TYLENOL   apixaban 5 MG Tabs tablet Commonly known as:  ELIQUIS   DECUBI-VITE PO   divalproex 125 MG capsule Commonly known as:  DEPAKOTE SPRINKLE   HYDROcodone-acetaminophen 5-325 MG tablet Commonly known as:  NORCO/VICODIN   LORazepam 0.5 MG tablet Commonly known as:  ATIVAN   PRESCRIPTION MEDICATION   traMADol 50 MG tablet Commonly known as:  ULTRAM   vitamin C 500 MG tablet Commonly known as:  ASCORBIC ACID     TAKE these medications   chlorhexidine 0.12 % solution Commonly known as:  PERIDEX 15 mLs by Mouth Rinse route 2 (two) times daily.   collagenase ointment Commonly known as:  SANTYL Apply topically daily.   COMBIVENT RESPIMAT 20-100 MCG/ACT Aers respimat Generic drug:  Ipratropium-Albuterol Inhale 1 puff into the lungs every 6 (six) hours.   enoxaparin 100 MG/ML injection Commonly known as:  LOVENOX Inject 1.15 mLs (115 mg total) into the skin every 12 (twelve) hours.   feeding supplement (PRO-STAT SUGAR FREE 64) Liqd Take 30 mLs by mouth 2 (two) times daily.   levalbuterol 0.63 MG/3ML nebulizer solution Commonly known as:  XOPENEX Take 3 mLs (0.63 mg total) by nebulization every 6 (six) hours as needed for wheezing or shortness of breath.   metoprolol tartrate 25 MG tablet Commonly known as:  LOPRESSOR Take 1 tablet (25 mg total) by mouth 2  (two) times daily.   mirtazapine 15 MG tablet Commonly known as:  REMERON Take 15 mg by mouth at bedtime.       Disposition and follow-up:   Dale George was discharged from [facility] in Stable condition.  At the hospital follow up visit please address:  Update MOST form to reflect goals of care for full comfort care should he have another aspiration event  DVT/PE: Please administer Lovenox instead of Eliquis.    Follow-up Appointments: Follow-up Information    Palliative Care. Go on 04/30/2016.           Hospital Course by problem list: Principal Problem:   Aspiration pneumonia ([place]) Active Problems:   History of tongue cancer   CLL (chronic lymphocytic leukemia) (HCC)   Pressure ulcer   Dysphagia   Atrial fibrillation with RVR (HCC)   Personal history of DVT (deep vein thrombosis)   Aspiration pneumonia: Likely in the setting of his dysphagia. Chest x-ray was concerning for LLL infiltrate. CT of the neck was reassuring for no abscess or more superior sources of infection. Urinalysis did show nitrites, leukocytes, bacteria, but urine culture was without growth. He had no growth in blood cultures. He was treated with broad-spectrum antibiotics for concern of sepsis which were narrowed to IV Unasyn to complete a total 7-day course of therapy after he remained afebrile and hemodynamically stable at 48 hours. Per conversations with the patient's daughter who is also the power of attorney, he prioritizes  comfort above all else would elect to pursue comfort care measures should this recur as she is well aware of his risk for aspiration.   Dysphagia: CT revealed no infectious sources but did show a thickened epiglottis and hypopharynx concerning for malignant reoccurrence. ENT was consulted and did not see signs of malignancy on their exam. Per conversations with the daughter, diet was ordered despite the risk of aspiration out of priority to comfort.   Atrial  fibrillation with RVR: Likely secondary to acute illness. On hospital day 4, his heart rate increased to the 180s and improved with IV metoprolol. He remained asymptomatic and received therapeutic Lovenox to treat his recent DVT/PE from the prior hospitalization.   Pressure ulcer: Noted on his left buttock by wound care. Treated with santyl ointment daily.  Hypertension: He remained normotensive off his home anti-hypertensive therapy.   Discharge Vitals:   BP (!) 126/94 (BP Location: Left Arm)   Pulse (!) 108   Temp 98.4 F (36.9 C) (Oral)   Resp 16   Wt 252 lb 3.3 oz (114.4 kg)   SpO2 96%   BMI 29.15 kg/m     Pertinent Labs, Studies, and Procedures:  PORTABLE CHEST 1 VIEW 04/23/16  COMPARISON:  1932 hours today and earlier  FINDINGS: Portable AP semi upright view at 2143 hours. Increased left lung base opacity, now mostly obscuring the left hemidiaphragm. Stable lung volumes. No pneumothorax or pulmonary edema. No definite pleural effusion. Stable cardiac size and mediastinal contours.  IMPRESSION: Interval increased left lung base opacity suspicious for pneumonia, or possibly aspiration in this setting. No pleural effusion Identified.     Discharge Instructions: Discharge Instructions    Call MD for:  difficulty breathing, headache or visual disturbances    Complete by:  As directed   Call MD for:  persistant nausea and vomiting    Complete by:  As directed   Call MD for:  temperature >100.4    Complete by:  As directed   Discharge wound care:    Complete by:  As directed   Apply santyl ointment to the wound on the back and then a dressing.      Signed: Riccardo Dubin, MD 04/29/2016, 1:01 PM   Pager: @MYPAGER @

## 2016-04-29 NOTE — Progress Notes (Signed)
   Subjective: This morning, he aggain appeared comfortable and did not have any complaints, like pain, nausea, vomiting, like yesterday.  Objective:  Vital signs in last 24 hours: Vitals:   04/28/16 1435 04/28/16 2032 04/28/16 2052 04/29/16 0533  BP: (!) 131/101 137/89  (!) 126/94  Pulse: (!) 131 (!) 141 (!) 120 (!) 108  Resp: 20 18  16   Temp: 98.1 F (36.7 C) 97.6 F (36.4 C)  98.4 F (36.9 C)  TempSrc: Oral Oral  Oral  SpO2: 96% 94%  96%  Weight:       Physical Exam  Constitutional: No distress.  Chronically ill appearing Caucasian male  HENT:  Head: Normocephalic and atraumatic.  Eyes: Conjunctivae are normal. No scleral icterus.  Cardiovascular:  Irregular rhythm  Pulmonary/Chest: Effort normal.  Clear breath sounds at the bases bilaterally   Assessment/Plan:  Principal Problem:   Aspiration pneumonia (HCC) Active Problems:   History of tongue cancer   CLL (chronic lymphocytic leukemia) (HCC)   Pressure ulcer   Dysphagia   Atrial fibrillation with RVR (HCC)   Personal history of DVT (deep vein thrombosis)  Dale George is a 70 year old male with CLL, dementia, left buttock decubitus ulcer, h/o DVT/PE, h/o tongue cancer hospitalized for presumed sepsis 2/2 aspiration pneumonia found to have new-onset atrial fibrillation now stable for discharge today.  Atrial fibrillation with RVR: Likely in the setting of acute infection. HR trending 100s-130s off medication. He continues to remain asymptomatic and not dyspenic as we weaned him to room air. -Treat atrial fibrillation only if it is symptomatic and causing discomfort with metoprolol 5mg  IV  Sepsis 2/2 aspiration pneumonia: Finish IV Unasyn [Abx Day 7/7]  Dysphagia: Likely multifactorial in the setting of his dementia and h/o tongue cancer. ENT evaluation reassuring for no signs of malignant recurrence.  -Continue Soft Diet  Decubitus ulcers: Wound Care following.  CLL: Baseline leukocytosis trending 80-90  and is now down to 62.7 today.  Thrombocytopenia: Platelets 87 this morning, likely in the setting of acute illness.   H/o DVT/PE: Unclear if related to CLL. -Continue Lovenox for now  #FEN:  -Diet: Soft  #DVT prophylaxis: Lovenox  #CODE STATUS: DNR/DNI -Defer to daughter Dale George if patients lacks decision-making capacity -Confirmed with patient on admission  Dispo: Anticipated discharge today.   LOS: 6 days   Dale Dubin, MD 04/29/2016, 11:48 AM Pager: @MYPAGER @

## 2016-04-29 NOTE — Progress Notes (Signed)
Subjective: [name] feels "okay" today without pain and with improved cough. Nurse at bedside drawing blood. [name] continues to endorse decreased appetite but is taking sips of water which soothe thirst.    Objective: Vital signs in last 24 hours: Vitals:   04/28/16 1435 04/28/16 2032 04/28/16 2052 04/29/16 0533  BP: (!) 131/101 137/89  (!) 126/94  Pulse: (!) 131 (!) 141 (!) 120 (!) 108  Resp: 20 18  16   Temp: 98.1 F (36.7 C) 97.6 F (36.4 C)  98.4 F (36.9 C)  TempSrc: Oral Oral  Oral  SpO2: 96% 94%  96%  Weight:       Weight change:   Intake/Output Summary (Last 24 hours) at 04/29/16 0902 Last data filed at 04/29/16 [id number]  Gross per 24 hour  Intake              480 ml  Output              600 ml  Net             -120 ml   General: awake, alert, oriented to self only. Moderately obese male in NAD. Throat: dry mucous membranes, slight cough. Lungs: Mild coarse breath sounds throughout lung fields. Moderate air movement upper lobes bilat. Heart: Tachycardic, irregularly irregular rhythm. No murmur. Abd: non-tender, non-distended.  Extremities: venous stasis bilat below mid-calf  Lab Results: No new results.  Studies/Results: No results found.   Medications: Scheduled Meds: . ampicillin-sulbactam (UNASYN) IV  3 g Intravenous Q8H  . antiseptic oral rinse  7 mL Mouth Rinse q12n4p  . chlorhexidine  15 mL Mouth Rinse BID  . collagenase   Topical Daily  . enoxaparin (LOVENOX) injection  115 mg Subcutaneous Q12H  . mupirocin ointment  1 application Nasal BID  . sodium chloride flush  3 mL Intravenous Q12H   Continuous Infusions:  PRN Meds:.acetaminophen, oxymetazoline   Assessment/Plan: [name] is a 70 year old male with severe dysphagia 2/2 dementia and remote tongue cancer surgery, CLL, left buttock decubitus ulcer, and a recent DVT/PE hospitalized for sepsis 2/2 presumed recurrent aspiration pneumonia.   Sepsis 2/2 recurrent aspiration pneumonia: Stable  with improved lung sounds on auscultation.  Switched from iv zosyn and vancomycin to iv unasyn on 8/5 (CXR does not appear suggestive of MRSA infection). Daughter and patient wish to continue oral feeding and express understanding of recurrent pneumonia risk with aspiration. Because [name] has improved with antibiotics, we will plan for discharge back to [facility] today (8/8) as he will have finished antibiotic course. Patient has completed MOST form in his chart. Daughter has appointment with outpatient [facility] to revise MOST form tomorrow 8/9. --Day 7/7 iv Unasyn (last dose is tonight)                         New onset Atrial fibrillation with RVR: Patient has been tachycardic throughout admission (range 110's-130's). Developed A fib with RVR early 8/6 and received 1 dose iv metoprolol 5 mg for rate control. Patient asymptomatic while in A fib. We will not control rate at this time given lack of symptoms and treatment priority for comfort. He is already chronically anticoagulated.  Dysphagia:likely contributing factor in sepsis. Patient's daughter believes dysphagia is related to both worseningdementia and tongue cancer resection. Speech evaluated 8/3and believe patient is high risk for aspiration. However, given DNR status and patient wishes to continue oral feeding, we will continuepuree diet.  Ulcerative lesions  on tongue and oropharynx:Unclear origin. Consider traumatic (suction tube-related) vs. Tongue cancer recurrence vs. Infectious. Discussed with ENT who agrees management should be palliative. Continue to manage symptomatically.            --antiseptic oral rinse  Decubitus ulcers:Wound care consulted. Not full thickness.                        --Continue Santyl  CLL: managed by Dr. Alvy Dale George. No further plans for treatment.  Recent PE, BL DVT, RV thrombus:Unclear etiology. Consider malignancy vs. Bed-bound. Will  discharge on Lovenox since patient cannot tolerate home po Eliquis.  [id number] normotensive and with decreased po intake, so we will not restart home antihypertensives at this time.  FEN/GI: Give puree diet (dysphagia 1 diet) as tolerated by patient.  DVT prophylaxis: Discharge on Lovenox.  Code Status:DNR, confirmed with daughter who is POA.  Disposition:Discharge to [facility] today.  This is a Careers information officer Note.  The care of the patient was discussed with Dr. Posey Pronto and the assessment and plan formulated with their assistance.  Please see their attached note for official documentation of the daily encounter.   LOS: 6 days   Pershing Cox, Medical Student 04/29/2016, 9:02 AM

## 2016-04-29 NOTE — Progress Notes (Signed)
PT Note  Appears PT order inadvertantly discontinued at transfer from ED to regular floor.  No significant changes to patient status.  Will resume PT. 04/29/2016 [place], [place]

## 2016-04-29 NOTE — Progress Notes (Signed)
ANTICOAGULATION CONSULT NOTE - Follow Up Consult  Pharmacy Consult for Lovenox Indication: Hx of PE, Afib  No Known Allergies  Patient Measurements: Weight: 252 lb 3.3 oz (114.4 kg)  Vital Signs: Temp: 98.4 F (36.9 C) (08/08 0533) Temp Source: Oral (08/08 0533) BP: 126/94 (08/08 0533) Pulse Rate: 108 (08/08 0533)  Labs:  Recent Labs  04/29/16 1048  HGB 12.3*  HCT 39.5  PLT 87*    Estimated Creatinine Clearance: 122.3 mL/min (by C-G formula based on SCr of 0.8 mg/dL).   Assessment: 70 yo M with hx of PE, LUE DVT and RV thrombus last month was on apixaban PTA (as long as PLTs > 50K can continue per heme), new afib 8/6, chads2vasc = 3. On Lovenox while unable to tolerate PO. Dysphagia diet 1 ordered. Hgb stable at 12.3, plts low but stable at 87. No s/s of bleed.  Goal of Therapy:  Monitor platelets by anticoagulation protocol: Yes   Plan:  Continue enoxaparin 115mg  SQ Q12H  Monitor CBC, s/s of bleed F/U transition back to PO Eliquis when able  Elenor Quinones, PharmD, BCPS Clinical Pharmacist Pager [phone] 04/29/2016 1:29 PM

## 2016-04-29 NOTE — Clinical Social Work Note (Signed)
CSW spoke with [facility] rep Mickel Baas. Mickel Baas states that they reviewed the patient's clinical information and it was sent to the medical director for review. Mickel Baas states that a peer to peer was conducted with Dr. Posey Pronto. After peer to peer the medical director ruled that the patient does not meet SNF requirements and should be in long term care which [employer] will not cover. Janie with [facility] informed of insurance decision and states that she will still accept the patient today. Per MD patient ready to DC back to [facility]. RN, patient/family, and facility notified of patient's DC. RN given number for report. DC packet on patient's chart. Ambulance transport requested for patient. CSW signing off at this time.   Liz Beach MSW, [place], [place], [id number]

## 2016-05-23 DEATH — deceased

## 2016-06-24 ENCOUNTER — Other Ambulatory Visit: Payer: Medicare PPO

## 2016-06-24 ENCOUNTER — Ambulatory Visit: Payer: Medicare PPO | Admitting: Hematology and Oncology

## 2016-12-31 IMAGING — CT CT NECK W/ CM
4 of 6 series · 13 of 35 positions shown, 15 images · IV contrast (Omni 300)
Comparison: CT HEAD and cervical spine March 05, 2016 head CT
cervical spine March 29, 2015

CLINICAL DATA: Spitting up purulent sputum. History of tongue
cancer, remote radical neck dissection. Assess for abscess or
infection.

EXAM:
CT NECK WITH CONTRAST
TECHNIQUE: Multidetector CT imaging of the neck was performed using the
standard protocol following the bolus administration of intravenous
contrast.
CONTRAST:  75mL [id number] IOPAMIDOL ([id number]) INJECTION 61%

[Series 2: neck 2.0 st · axial · 0.48mm/px · z∈[-188,-104]mm · 2 of 128 slices shown (1 of 3)]
[im 43/128  bone]
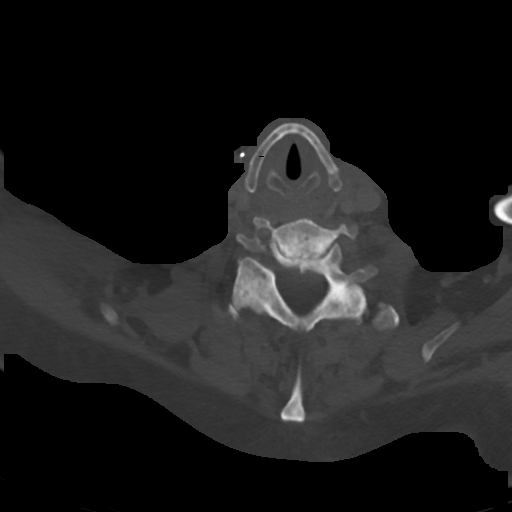
[im 85/128  bone]
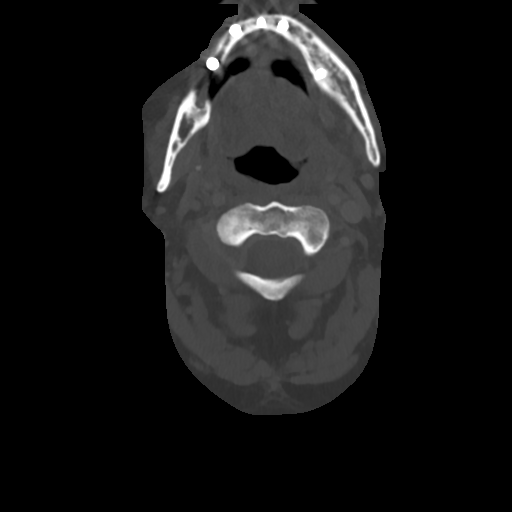

[Series 5: neck 2.0 st · sagittal · 0.52mm/px · 5 of 101 slices shown, 6 images (2 of 3)]
[im 34/101  bone]
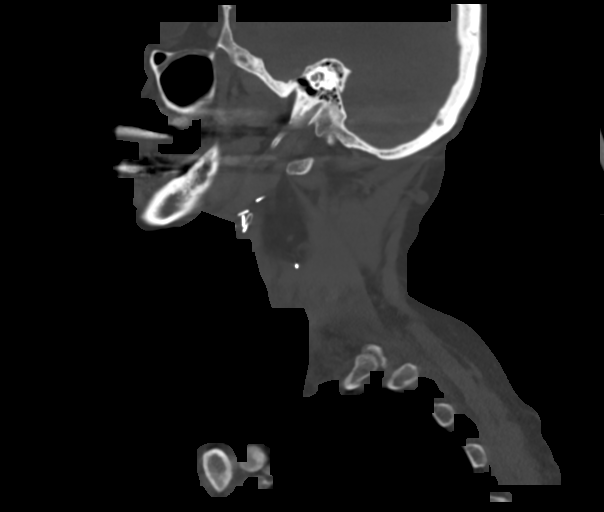
[im 42/101  bone]
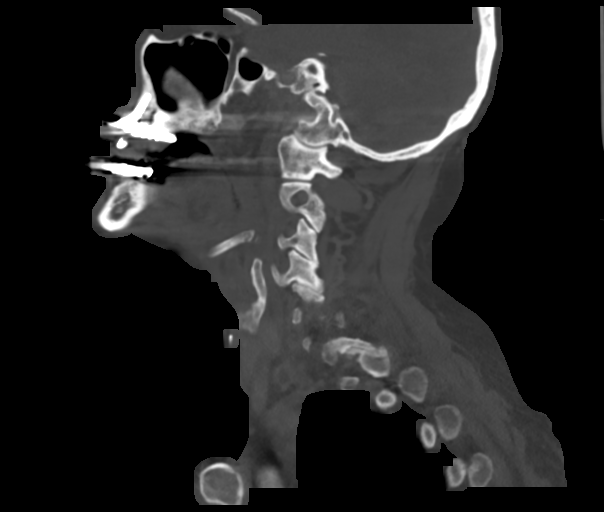
[im 51/101  soft-tissue]
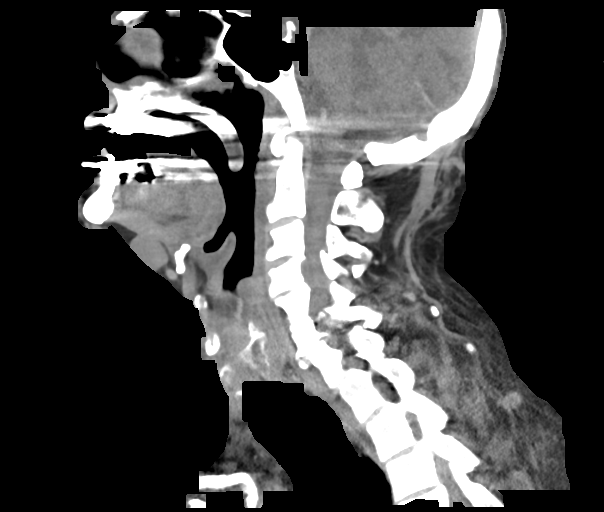
[im 51/101  bone]
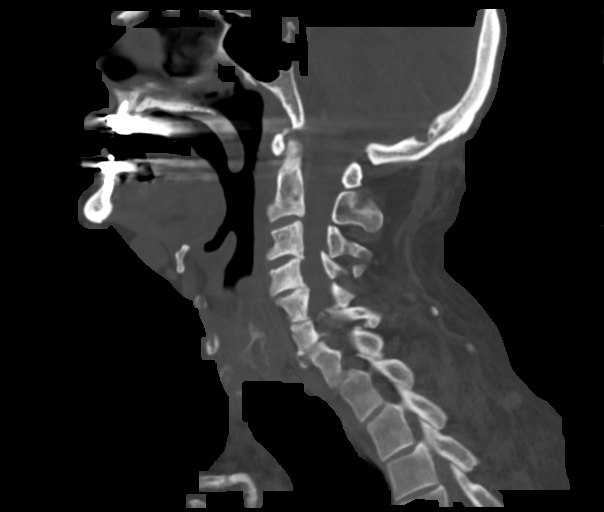
[im 59/101  bone]
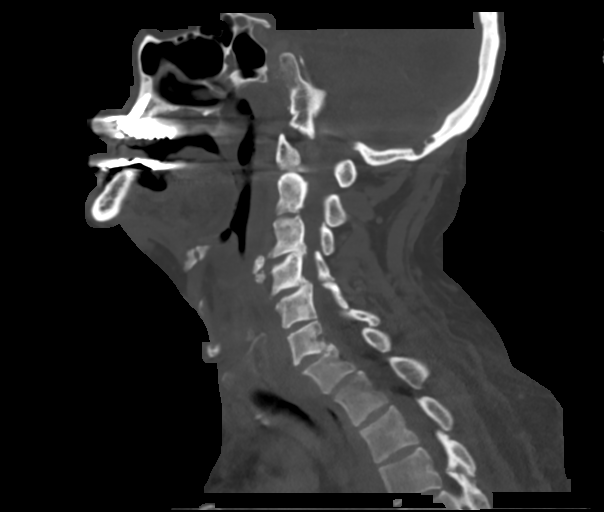
[im 67/101  bone]
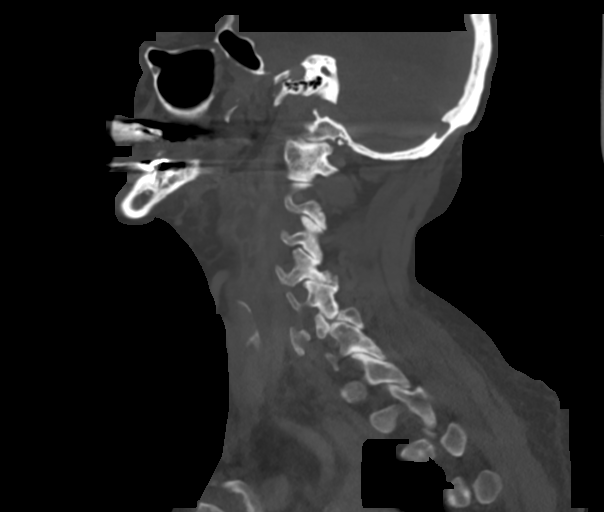

[Series 6: neck 2.0 st · coronal · 0.40mm/px · 3 of 121 slices shown (3 of 3)]
[im 25/121  bone]
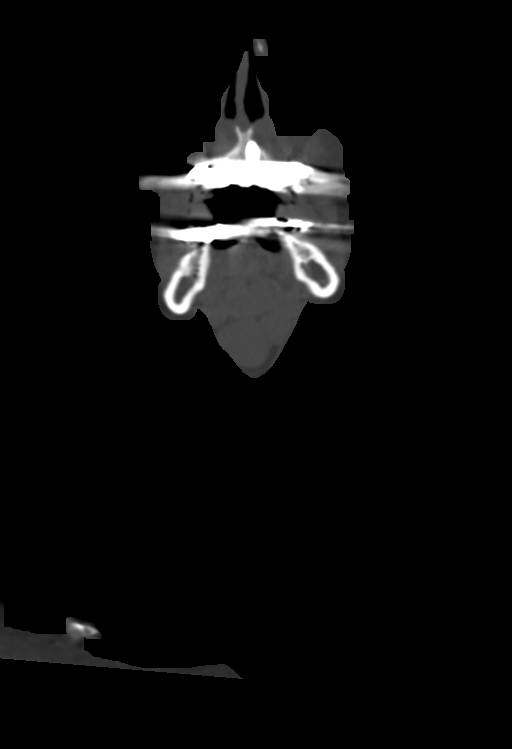
[im 49/121  bone]
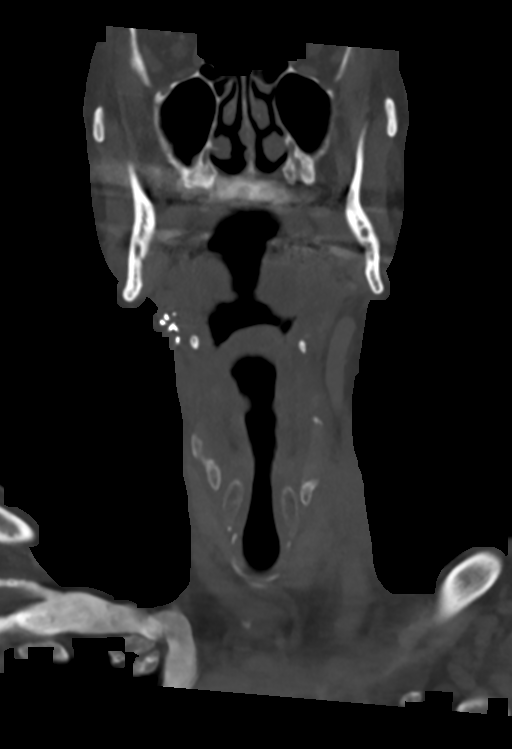
[im 73/121  bone]
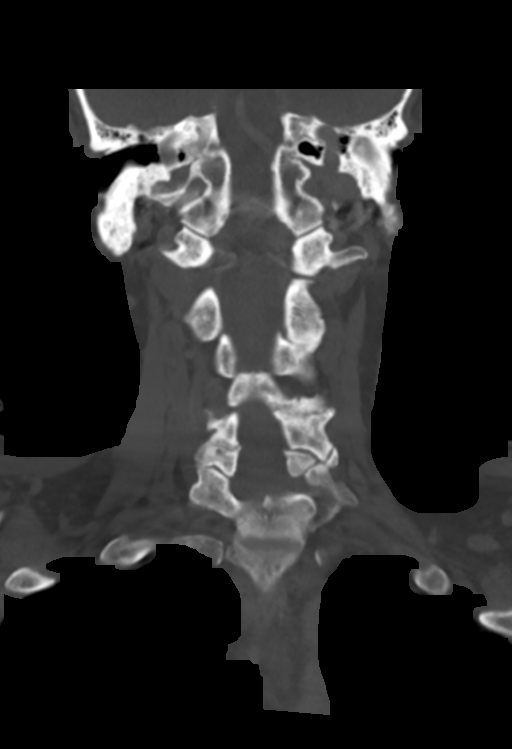

[Series 7: neck 2.0 st orthogonal · axial · 0.40mm/px · z∈[-251,-113]mm · 3 of 141 slices shown, 4 images]
[im 36/141  soft-tissue]
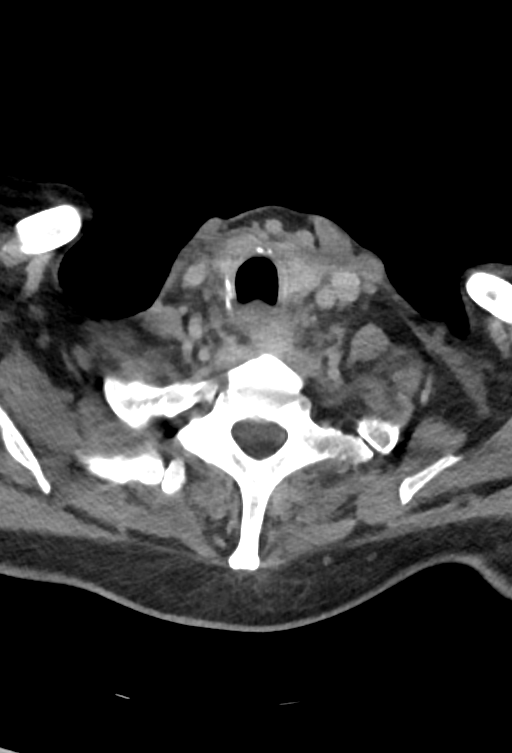
[im 36/141  bone]
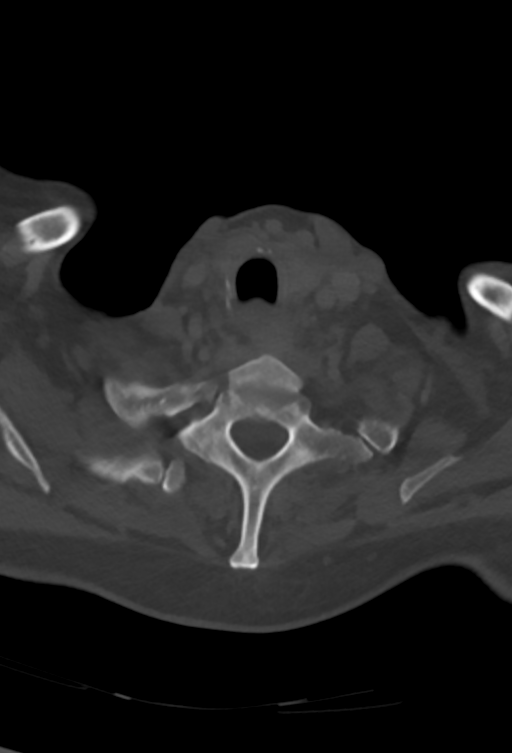
[im 71/141  bone]
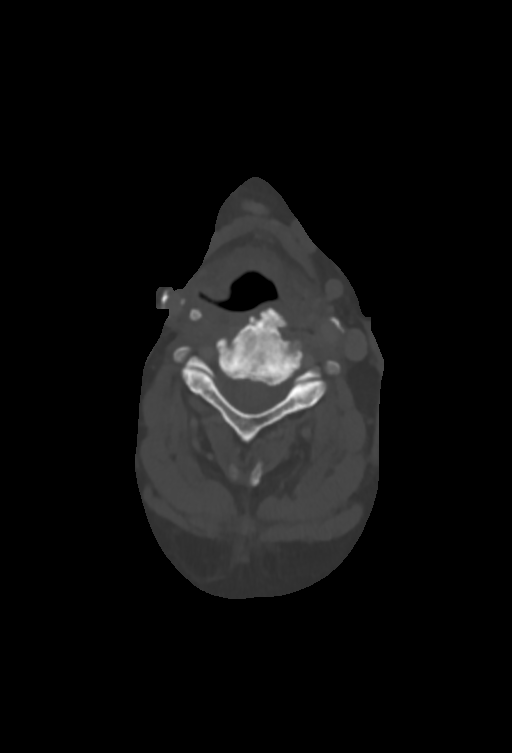
[im 106/141  bone]
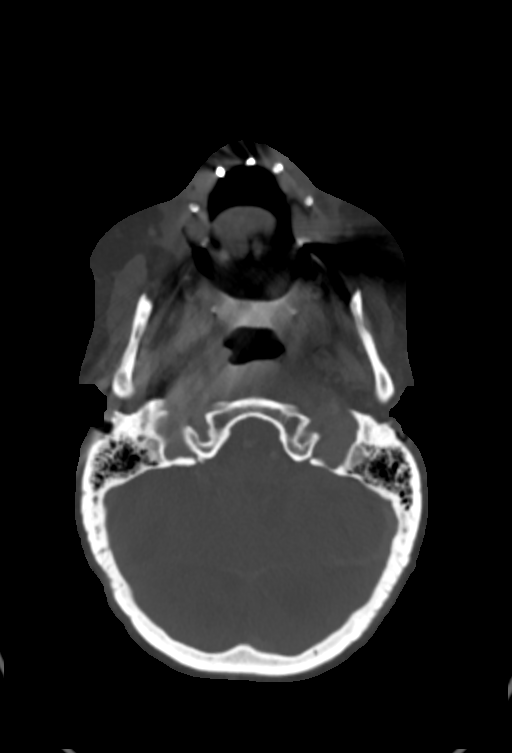

[13 of 35 positions shown; findings below may reference images not displayed]

FINDINGS: Pharynx and larynx: Thickened epiglottis and area epiglottic folds
effacing the piriform sinuses. No discrete mass or fluid collection.

Salivary glands: Diminutive RIGHT submandibular gland, a trophic
versus resected LEFT submandibular gland. Fatty replaced parotid
glands.

Thyroid: Normal.

Lymph nodes: Status post RIGHT nodal dissection. Sub cm scattered
LEFT lymph nodes without lymphadenopathy by CT size criteria.
Subcentimeter

Vascular: Resected RIGHT internal jugular vein. Calcific
atherosclerosis of the carotid bulbs, suspect at least moderate
stenosis RIGHT internal carotid artery though not tailored for
evaluation.

Limited intracranial: Ventriculomegaly, similar to prior CT. No
acute process in the head though, limited by streak artifact from
skullbase in dental amalgam.

Visualized orbits: Normal.

Mastoids and visualized paranasal sinuses: Well aerated.

Skeleton: Chronic C6 pars fractures with grade 1 C6-7
anterolisthesis, better characterized on recent CT cervical spine.
Resected RIGHT sternocleidomastoid muscle.

Upper chest: Lung apices are clear. Centrilobular emphysema. No
superior mediastinal lymphadenopathy loop though limited by mild
respiratory motion. Multiple partially imaged sub cm retro pectoral
lymph nodes.
IMPRESSION: No abscess or drainable fluid collection.  Patent airway.

Thickened epiglottis and hypopharynx, though these may be post
treatment related, considering progression from 0309, re- occurrence
is a concern. Level 1 lymphadenopathy. Recommend direct inspection
and, consider PET-CT as clinically indicated.
